# Patient Record
Sex: Male | Born: 1990 | Race: Black or African American | Hispanic: No | Marital: Single | Smoking: Current every day smoker
Health system: Southern US, Community
[De-identification: ages and names within clinical notes are randomized; demographics above are authoritative.]

## PROBLEM LIST (undated history)

## (undated) ENCOUNTER — Emergency Department (HOSPITAL_COMMUNITY): Admission: EM | Payer: Self-pay | Source: Home / Self Care

## (undated) DIAGNOSIS — Z789 Other specified health status: Secondary | ICD-10-CM

---
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
## 2010-05-26 LAB — URINE MICROSCOPIC-ADD ON

## 2010-05-26 LAB — URINALYSIS, ROUTINE W REFLEX MICROSCOPIC
Glucose, UA: NEGATIVE mg/dL
Specific Gravity, Urine: 1.014 (ref 1.005–1.030)
pH: 7 (ref 5.0–8.0)

## 2010-05-26 LAB — URINE CULTURE

## 2010-07-25 ENCOUNTER — Emergency Department (HOSPITAL_COMMUNITY): Payer: Medicaid Other

## 2010-07-25 ENCOUNTER — Emergency Department (HOSPITAL_COMMUNITY)
Admission: EM | Admit: 2010-07-25 | Discharge: 2010-07-25 | Disposition: A | Discharge status: Home / Self Care | Payer: Medicaid Other | Attending: Emergency Medicine | Admitting: Emergency Medicine

## 2010-07-25 DIAGNOSIS — M79609 Pain in unspecified limb: Secondary | ICD-10-CM | POA: Insufficient documentation

## 2010-07-25 DIAGNOSIS — W268XXA Contact with other sharp object(s), not elsewhere classified, initial encounter: Secondary | ICD-10-CM | POA: Insufficient documentation

## 2010-07-25 DIAGNOSIS — S61409A Unspecified open wound of unspecified hand, initial encounter: Secondary | ICD-10-CM | POA: Insufficient documentation

## 2012-01-20 ENCOUNTER — Encounter (HOSPITAL_COMMUNITY): Payer: Self-pay | Admitting: Nurse Practitioner

## 2012-01-20 ENCOUNTER — Emergency Department (HOSPITAL_COMMUNITY)
Admission: EM | Admit: 2012-01-20 | Discharge: 2012-01-20 | Disposition: A | Discharge status: Home / Self Care | Payer: Self-pay | Attending: Emergency Medicine | Admitting: Emergency Medicine

## 2012-01-20 DIAGNOSIS — N342 Other urethritis: Secondary | ICD-10-CM | POA: Insufficient documentation

## 2012-01-20 DIAGNOSIS — F17200 Nicotine dependence, unspecified, uncomplicated: Secondary | ICD-10-CM | POA: Insufficient documentation

## 2012-01-20 LAB — URINALYSIS, ROUTINE W REFLEX MICROSCOPIC
Hgb urine dipstick: NEGATIVE
Ketones, ur: NEGATIVE mg/dL
Nitrite: NEGATIVE
Urobilinogen, UA: 1 mg/dL (ref 0.0–1.0)

## 2012-01-20 MED ORDER — AZITHROMYCIN 250 MG PO TABS
1000.0000 mg | ORAL_TABLET | Freq: Once | ORAL | Status: AC
  Administered 2012-01-20: 1000 mg via ORAL
  Filled 2012-01-20: qty 4

## 2012-01-20 MED ORDER — CEFTRIAXONE SODIUM 250 MG IJ SOLR
250.0000 mg | Freq: Once | INTRAMUSCULAR | Status: AC
  Administered 2012-01-20: 250 mg via INTRAMUSCULAR
  Filled 2012-01-20: qty 250

## 2012-01-20 NOTE — ED Notes (Signed)
Pt c/o tenderness upon palpation right flank; pt mentating appropriately.

## 2012-01-20 NOTE — ED Provider Notes (Signed)
History    This chart was scribed for Chad Quarry, MD, MD by Smitty Pluck, ED Scribe. The patient was seen in room TR09C/ and the patient's care was started at 12:37PM.   CSN: [id number]  Arrival date & time 01/20/12  1149       Chief Complaint  Patient presents with  . SEXUALLY TRANSMITTED DISEASE    (Consider location/radiation/quality/duration/timing/severity/associated sxs/prior treatment) Patient is a 21 y.o. male presenting with dysuria. The history is provided by the patient. No language interpreter was used.  Dysuria  This is a new problem. The current episode started yesterday. The problem occurs every urination. The problem has not changed since onset.The quality of the pain is described as burning. The pain is moderate. There has been no fever. He is sexually active. There is no history of pyelonephritis. Pertinent negatives include no chills, no nausea and no vomiting.   [name] is a 21 y.o. male who presents to the Emergency Department complaining of constant, moderate dysuria onset 1 day ago. Pt reports having intercourse with girl  4 days agoand the condom broke. He reports that he has had sex 4x within last 6 months. He denies hx of STD. He denies pain in testicles, swelling in testicles, penile discharge and any other pain. He reports that he smokes cigarettes and drinks alcohol.  History reviewed. No pertinent past medical history.  History reviewed. No pertinent past surgical history.  History reviewed. No pertinent family history.  History  Substance Use Topics  . Smoking status: Current Every Day Smoker  . Smokeless tobacco: Not on file  . Alcohol Use: Yes     Comment: sometimes       Review of Systems  Constitutional: Negative for fever and chills.  Respiratory: Negative for shortness of breath.   Gastrointestinal: Negative for nausea and vomiting.  Genitourinary: Positive for dysuria. Negative for discharge, penile swelling, scrotal  swelling, penile pain and testicular pain.  Neurological: Negative for weakness.  All other systems reviewed and are negative.    Allergies  Review of patient's allergies indicates no known allergies.  Home Medications  No current outpatient prescriptions on file.  BP 134/82  Pulse 97  Temp 98 F (36.7 C) (Oral)  Resp 15  SpO2 99%  Physical Exam  Nursing note and vitals reviewed. Constitutional: He is oriented to person, place, and time. He appears well-developed and well-nourished. No distress.  HENT:  Head: Normocephalic and atraumatic.  Eyes: EOM are normal.  Neck: Neck supple. No tracheal deviation present.  Cardiovascular: Normal rate.   Pulmonary/Chest: Effort normal. No respiratory distress.  Genitourinary: Testes normal and penis normal. No penile tenderness.  Musculoskeletal: Normal range of motion.  Neurological: He is alert and oriented to person, place, and time.  Skin: Skin is warm and dry.  Psychiatric: He has a normal mood and affect. His behavior is normal.    ED Course  Procedures (including critical care time) DIAGNOSTIC STUDIES: Oxygen Saturation is 99% on room air, normal by my interpretation.    COORDINATION OF CARE: 12:40 PM Discussed ED treatment with pt      Labs Reviewed  URINALYSIS, ROUTINE W REFLEX MICROSCOPIC   No results found.   No diagnosis found.    MDM  I personally performed the services described in this documentation, which was scribed in my presence. The recorded information has been reviewed and considered.   Chad Quarry, MD 01/25/12 [phone]

## 2012-01-20 NOTE — ED Notes (Signed)
Pt given d/c teaching. Pt has no further questions upon d/c.

## 2012-01-20 NOTE — ED Notes (Signed)
Pt reports condom broke during sex last week adn he is having penile burning since yesterday

## 2012-05-15 ENCOUNTER — Emergency Department (HOSPITAL_COMMUNITY)
Admission: EM | Admit: 2012-05-15 | Discharge: 2012-05-15 | Disposition: A | Discharge status: Home / Self Care | Payer: Self-pay | Attending: Emergency Medicine | Admitting: Emergency Medicine

## 2012-05-15 ENCOUNTER — Encounter (HOSPITAL_COMMUNITY): Payer: Self-pay

## 2012-05-15 DIAGNOSIS — R300 Dysuria: Secondary | ICD-10-CM | POA: Insufficient documentation

## 2012-05-15 DIAGNOSIS — F17200 Nicotine dependence, unspecified, uncomplicated: Secondary | ICD-10-CM | POA: Insufficient documentation

## 2012-05-15 DIAGNOSIS — Z7251 High risk heterosexual behavior: Secondary | ICD-10-CM | POA: Insufficient documentation

## 2012-05-15 LAB — URINALYSIS, ROUTINE W REFLEX MICROSCOPIC
Bilirubin Urine: NEGATIVE
Glucose, UA: NEGATIVE mg/dL
Ketones, ur: NEGATIVE mg/dL
Leukocytes, UA: NEGATIVE
Specific Gravity, Urine: 1.018 (ref 1.005–1.030)
pH: 7.5 (ref 5.0–8.0)

## 2012-05-15 MED ORDER — LIDOCAINE HCL (PF) 1 % IJ SOLN
INTRAMUSCULAR | Status: AC
  Administered 2012-05-15: 5 mL
  Filled 2012-05-15: qty 5

## 2012-05-15 MED ORDER — AZITHROMYCIN 250 MG PO TABS
1000.0000 mg | ORAL_TABLET | Freq: Once | ORAL | Status: AC
  Administered 2012-05-15: 1000 mg via ORAL
  Filled 2012-05-15: qty 4

## 2012-05-15 MED ORDER — CEFTRIAXONE SODIUM 250 MG IJ SOLR
250.0000 mg | Freq: Once | INTRAMUSCULAR | Status: AC
  Administered 2012-05-15: 250 mg via INTRAMUSCULAR
  Filled 2012-05-15: qty 250

## 2012-05-15 NOTE — ED Provider Notes (Signed)
Medical screening examination/treatment/procedure(s) were performed by non-physician practitioner and as supervising physician I was immediately available for consultation/collaboration.   Charles B. Sheldon, MD 05/15/12 1520 

## 2012-05-15 NOTE — ED Provider Notes (Signed)
History     CSN: [id number]  Arrival date & time 05/15/12  1322   First MD Initiated Contact with Patient 05/15/12 1333      No chief complaint on file.   (Consider location/radiation/quality/duration/timing/severity/associated sxs/prior treatment) HPI  22 year old male with prior history of urethritis presents complaining of dysuria. Patient states he has sexual intercourse with his partner 4 days ago and his condom broke. Subsequently she developed burning urination which has been ongoing for the past 2 days.  Reports mild burning at tip of penis when peed.  Denies fever, chills, rash, testicular swelling or pain.  Denies abdomen or back pain.  No specific treatment tried, nothing makes it better or worse.   History reviewed. No pertinent past medical history.  History reviewed. No pertinent past surgical history.  History reviewed. No pertinent family history.  History  Substance Use Topics  . Smoking status: Current Every Day Smoker  . Smokeless tobacco: Not on file  . Alcohol Use: Yes     Comment: sometimes       Review of Systems  Constitutional: Negative for fever.  Genitourinary: Positive for dysuria. Negative for frequency, hematuria, flank pain, discharge, penile swelling, scrotal swelling, genital sores, penile pain and testicular pain.  Skin: Negative for rash and wound.  Neurological: Negative for numbness.    Allergies  Review of patient's allergies indicates no known allergies.  Home Medications  No current outpatient prescriptions on file.  BP 134/85  Pulse 80  Temp(Src) 96.8 F (36 C) (Oral)  Resp 16  SpO2 100%  Physical Exam  Nursing note and vitals reviewed. Constitutional: He appears well-developed and well-nourished.  HENT:  Head: Atraumatic.  Eyes: Conjunctivae are normal.  Neck: Neck supple.  Abdominal: Soft. There is no tenderness.  Genitourinary: Penis normal. No penile tenderness.  No cva tenderness  Neurological: He is alert.   Skin: Skin is warm. No rash noted.  Psychiatric: He has a normal mood and affect.    ED Course  Procedures (including critical care time)  2:45 PM Pt reports of dysuria and urinary discomfort after his condom broke.  Examination of genitalia is unremarkable.  ua shows no evidence of UTI.  Will preemptively treat for possible std with rocephin/zithromax which pt agrees.    Labs Reviewed  GC/CHLAMYDIA PROBE AMP  URINALYSIS, ROUTINE W REFLEX MICROSCOPIC   No results found. Results for orders placed during the hospital encounter of 05/15/12  URINALYSIS, ROUTINE W REFLEX MICROSCOPIC      Result Value Range   Color, Urine YELLOW  YELLOW   APPearance CLEAR  CLEAR   Specific Gravity, Urine 1.018  1.005 - 1.030   pH 7.5  5.0 - 8.0   Glucose, UA NEGATIVE  NEGATIVE mg/dL   Hgb urine dipstick NEGATIVE  NEGATIVE   Bilirubin Urine NEGATIVE  NEGATIVE   Ketones, ur NEGATIVE  NEGATIVE mg/dL   Protein, ur NEGATIVE  NEGATIVE mg/dL   Urobilinogen, UA 1.0  0.0 - 1.0 mg/dL   Nitrite NEGATIVE  NEGATIVE   Leukocytes, UA NEGATIVE  NEGATIVE   No results found.    1. Dysuria       MDM  BP 134/85  Pulse 80  Temp(Src) 96.8 F (36 C) (Oral)  Resp 16  SpO2 100%         Fayrene Helper, PA-C 05/15/12 1517

## 2012-05-15 NOTE — ED Notes (Signed)
Pt presents with dysuria x 4 days.  Pt reports intercourse, with broken condom.  Unknown if partner is symptomatic.

## 2012-08-23 ENCOUNTER — Encounter (HOSPITAL_COMMUNITY): Payer: Self-pay | Admitting: *Deleted

## 2012-08-23 ENCOUNTER — Emergency Department (HOSPITAL_COMMUNITY)
Admission: EM | Admit: 2012-08-23 | Discharge: 2012-08-23 | Disposition: A | Discharge status: Home / Self Care | Payer: Self-pay | Attending: Emergency Medicine | Admitting: Emergency Medicine

## 2012-08-23 DIAGNOSIS — F17200 Nicotine dependence, unspecified, uncomplicated: Secondary | ICD-10-CM | POA: Insufficient documentation

## 2012-08-23 DIAGNOSIS — Z113 Encounter for screening for infections with a predominantly sexual mode of transmission: Secondary | ICD-10-CM | POA: Insufficient documentation

## 2012-08-23 NOTE — ED Provider Notes (Signed)
   History    CSN: [id number] Arrival date & time 08/23/12  2000  First MD Initiated Contact with Patient 08/23/12 2019     Chief Complaint  Patient presents with  . Exposure to STD   HPI  History provided by the patient. Patient is a 22 year old male with no sniffed in PMH who presents with request for general STD screening. Patient is currently sexually active and reports last intercourse 3 weeks ago. He does report using condom, barrier protection. He has no reason to believe his partner had any STDs and he currently denies any symptoms. Denies any rash or irritation to the skin. No penile discharge. No dysuria, urinary frequency abdominal pain. No fevers, chills or night sweats. No weight changes. Patient states that he occasionally presents to the emergency room for general STD screening and has done so in the past due to sexual activity. Denies any other risk factors for STDs. No IV drug use.     History reviewed. No pertinent past medical history. History reviewed. No pertinent past surgical history. No family history on file. History  Substance Use Topics  . Smoking status: Current Every Day Smoker  . Smokeless tobacco: Not on file  . Alcohol Use: Yes     Comment: sometimes     Review of Systems  Constitutional: Negative for fever, chills, diaphoresis and unexpected weight change.  Gastrointestinal: Negative for abdominal pain.  Genitourinary: Negative for dysuria, frequency, discharge and penile swelling.  All other systems reviewed and are negative.    Allergies  Review of patient's allergies indicates no known allergies.  Home Medications  No current outpatient prescriptions on file. BP 127/96  Pulse 92  Temp(Src) 98.5 F (36.9 C) (Oral)  Resp 12  SpO2 100% Physical Exam  Nursing note and vitals reviewed. Constitutional: He is oriented to person, place, and time. He appears well-developed and well-nourished. No distress.  HENT:  Head: Normocephalic.   Cardiovascular: Normal rate and regular rhythm.   Pulmonary/Chest: Effort normal and breath sounds normal. No respiratory distress.  Abdominal: Soft. There is no tenderness. There is no rebound and no guarding.  Genitourinary: Testes normal and penis normal.  2 small soft nodules to the right side of the glans. No overlying skin changes. No vesicles. Per patient these are chronic since young childhood.no penile discharge. No other rashes or lesions. Testicles nontender normal without asymmetry. No lymphadenopathy in the groin.  Musculoskeletal: Normal range of motion.  Lymphadenopathy:       Right: No inguinal adenopathy present.       Left: No inguinal adenopathy present.  Neurological: He is alert and oriented to person, place, and time.  Skin: Skin is warm. No rash noted. No erythema.  Psychiatric: He has a normal mood and affect. His behavior is normal.    ED Course  Procedures    Labs Reviewed  GC/CHLAMYDIA PROBE AMP  RPR  HIV ANTIBODY (ROUTINE TESTING)      1. Screen for STD (sexually transmitted disease)       MDM  8:20PMpatient seen and evaluated. Patient well appearing. He has no complaints or symptoms. He is simply concerned and requesting general screening.  Angus Seller, PA-C 08/23/12 2041

## 2012-08-23 NOTE — ED Notes (Signed)
He just wants a std check

## 2012-08-24 LAB — GC/CHLAMYDIA PROBE AMP
CT Probe RNA: NEGATIVE
GC Probe RNA: NEGATIVE

## 2012-08-24 LAB — HIV ANTIBODY (ROUTINE TESTING W REFLEX): HIV: NONREACTIVE

## 2012-08-24 NOTE — ED Provider Notes (Signed)
  Medical screening examination/treatment/procedure(s) were performed by non-physician practitioner and as supervising physician I was immediately available for consultation/collaboration.    Gerhard Munch, MD 08/24/12 [phone]

## 2013-05-01 ENCOUNTER — Encounter (HOSPITAL_COMMUNITY): Payer: Self-pay | Admitting: Emergency Medicine

## 2013-05-01 ENCOUNTER — Emergency Department (HOSPITAL_COMMUNITY)
Admission: EM | Admit: 2013-05-01 | Discharge: 2013-05-01 | Disposition: A | Discharge status: Rehabilitation Facility | Payer: Self-pay | Attending: Emergency Medicine | Admitting: Emergency Medicine

## 2013-05-01 ENCOUNTER — Inpatient Hospital Stay (HOSPITAL_COMMUNITY): Admission: AD | Admit: 2013-05-01 | Payer: Self-pay | Source: Intra-hospital | Admitting: Psychiatry

## 2013-05-01 DIAGNOSIS — F329 Major depressive disorder, single episode, unspecified: Secondary | ICD-10-CM

## 2013-05-01 DIAGNOSIS — F17200 Nicotine dependence, unspecified, uncomplicated: Secondary | ICD-10-CM | POA: Insufficient documentation

## 2013-05-01 DIAGNOSIS — F1020 Alcohol dependence, uncomplicated: Secondary | ICD-10-CM | POA: Diagnosis present

## 2013-05-01 DIAGNOSIS — F32A Depression, unspecified: Secondary | ICD-10-CM | POA: Diagnosis present

## 2013-05-01 DIAGNOSIS — F1010 Alcohol abuse, uncomplicated: Secondary | ICD-10-CM | POA: Insufficient documentation

## 2013-05-01 DIAGNOSIS — Z0289 Encounter for other administrative examinations: Secondary | ICD-10-CM | POA: Insufficient documentation

## 2013-05-01 DIAGNOSIS — F3289 Other specified depressive episodes: Secondary | ICD-10-CM

## 2013-05-01 LAB — COMPREHENSIVE METABOLIC PANEL
ALT: 17 U/L (ref 0–53)
AST: 45 U/L — ABNORMAL HIGH (ref 0–37)
Albumin: 4.4 g/dL (ref 3.5–5.2)
Alkaline Phosphatase: 63 U/L (ref 39–117)
BILIRUBIN TOTAL: 0.3 mg/dL (ref 0.3–1.2)
BUN: 8 mg/dL (ref 6–23)
CHLORIDE: 104 meq/L (ref 96–112)
CO2: 24 meq/L (ref 19–32)
CREATININE: 0.89 mg/dL (ref 0.50–1.35)
Calcium: 9 mg/dL (ref 8.4–10.5)
GFR calc Af Amer: >90 mL/min (ref >90)
Glucose, Bld: 87 mg/dL (ref 70–99)
Potassium: 4 mEq/L (ref 3.7–5.3)
Sodium: 146 mEq/L (ref 137–147)
Total Protein: 7.6 g/dL (ref 6.0–8.3)

## 2013-05-01 LAB — CBC
HEMATOCRIT: 43.6 % (ref 39.0–52.0)
Hemoglobin: 15.7 g/dL (ref 13.0–17.0)
MCH: 31.7 pg (ref 26.0–34.0)
MCHC: 36 g/dL (ref 30.0–36.0)
MCV: 87.9 fL (ref 78.0–100.0)
Platelets: 240 10*3/uL (ref 150–400)
RBC: 4.96 MIL/uL (ref 4.22–5.81)
RDW: 13.8 % (ref 11.5–15.5)
WBC: 5.8 10*3/uL (ref 4.0–10.5)

## 2013-05-01 LAB — RAPID URINE DRUG SCREEN, HOSP PERFORMED
Amphetamines: NOT DETECTED
Barbiturates: NOT DETECTED
Benzodiazepines: NOT DETECTED
Cocaine: NOT DETECTED
OPIATES: NOT DETECTED
Tetrahydrocannabinol: NOT DETECTED

## 2013-05-01 LAB — ETHANOL: ALCOHOL ETHYL (B): 345 mg/dL — AB (ref 0–11)

## 2013-05-01 MED ORDER — CHLORDIAZEPOXIDE HCL 25 MG PO CAPS: 25.0000 mg | ORAL_CAPSULE | Freq: Four times a day (QID) | ORAL | Status: DC | PRN

## 2013-05-01 MED ORDER — LORAZEPAM 1 MG PO TABS: 1.0000 mg | ORAL_TABLET | Freq: Three times a day (TID) | ORAL | Status: DC | PRN

## 2013-05-01 MED ORDER — NICOTINE 21 MG/24HR TD PT24: 21.0000 mg | MEDICATED_PATCH | Freq: Every day | TRANSDERMAL | Status: DC

## 2013-05-01 MED ORDER — ALUM & MAG HYDROXIDE-SIMETH 200-200-20 MG/5ML PO SUSP: 30.0000 mL | ORAL | Status: DC | PRN

## 2013-05-01 MED ORDER — VITAMIN B-1 100 MG PO TABS
100.0000 mg | ORAL_TABLET | Freq: Once | ORAL | Status: AC
  Administered 2013-05-01: 100 mg via ORAL
  Filled 2013-05-01: qty 1

## 2013-05-01 MED ORDER — THIAMINE HCL 100 MG/ML IJ SOLN
100.0000 mg | Freq: Once | INTRAMUSCULAR | Status: DC
Start: 2013-05-01 — End: 2013-05-01

## 2013-05-01 MED ORDER — ONDANSETRON HCL 4 MG PO TABS: 4.0000 mg | ORAL_TABLET | Freq: Three times a day (TID) | ORAL | Status: DC | PRN

## 2013-05-01 MED ORDER — IBUPROFEN 200 MG PO TABS: 600.0000 mg | ORAL_TABLET | Freq: Three times a day (TID) | ORAL | Status: DC | PRN

## 2013-05-01 MED ORDER — LOPERAMIDE HCL 2 MG PO CAPS: 2.0000 mg | ORAL_CAPSULE | ORAL | Status: DC | PRN

## 2013-05-01 MED ORDER — TRAZODONE HCL 50 MG PO TABS: 50.0000 mg | ORAL_TABLET | Freq: Every day | ORAL | Status: DC

## 2013-05-01 MED ORDER — ADULT MULTIVITAMIN W/MINERALS CH
1.0000 | ORAL_TABLET | Freq: Every day | ORAL | Status: DC
  Administered 2013-05-01: 1 via ORAL
  Filled 2013-05-01: qty 1

## 2013-05-01 MED ORDER — ZOLPIDEM TARTRATE 5 MG PO TABS: 5.0000 mg | ORAL_TABLET | Freq: Every evening | ORAL | Status: DC | PRN

## 2013-05-01 MED ORDER — HYDROXYZINE HCL 25 MG PO TABS: 25.0000 mg | ORAL_TABLET | Freq: Four times a day (QID) | ORAL | Status: DC | PRN

## 2013-05-01 MED ORDER — ACETAMINOPHEN 325 MG PO TABS: 650.0000 mg | ORAL_TABLET | ORAL | Status: DC | PRN

## 2013-05-01 MED ORDER — CHLORDIAZEPOXIDE HCL 25 MG PO CAPS
25.0000 mg | ORAL_CAPSULE | Freq: Once | ORAL | Status: AC
  Administered 2013-05-01: 25 mg via ORAL
  Filled 2013-05-01: qty 1

## 2013-05-01 MED ORDER — VITAMIN B-1 100 MG PO TABS: 100.0000 mg | ORAL_TABLET | Freq: Every day | ORAL | Status: DC

## 2013-05-01 MED ORDER — ONDANSETRON 4 MG PO TBDP: 4.0000 mg | ORAL_TABLET | Freq: Four times a day (QID) | ORAL | Status: DC | PRN

## 2013-05-01 MED ORDER — CHLORDIAZEPOXIDE HCL 25 MG PO CAPS: 25.0000 mg | ORAL_CAPSULE | Freq: Three times a day (TID) | ORAL | Status: DC

## 2013-05-01 MED ORDER — CHLORDIAZEPOXIDE HCL 25 MG PO CAPS: 25.0000 mg | ORAL_CAPSULE | ORAL | Status: DC

## 2013-05-01 MED ORDER — CHLORDIAZEPOXIDE HCL 25 MG PO CAPS
25.0000 mg | ORAL_CAPSULE | Freq: Four times a day (QID) | ORAL | Status: DC
  Administered 2013-05-01 (×2): 25 mg via ORAL
  Filled 2013-05-01 (×2): qty 1

## 2013-05-01 MED ORDER — CHLORDIAZEPOXIDE HCL 25 MG PO CAPS: 25.0000 mg | ORAL_CAPSULE | Freq: Every day | ORAL | Status: DC

## 2013-05-01 NOTE — BH Assessment (Signed)
Assessment Note  Chad Tanner is an 23 y.o. male.   Pt consumes alcohol 3x per week.  Pt consumes approimately 100 to 200 oz at a time.  Pt has been using for months.  Pt denies consuming daily.  Pt denies SI, HI and psychosis.  Pt denies military service and no past mental health issues.  Pt denies depression.  Pt BAL was 345 last night.  Pt reports "I drank more than usual last night."    Pt appears to need optx referrals and pt is in agreement with this plan.  Pt able to contract for safety and psychiatry is going to round to determine dispo with pt.  Axis I: Alcohol Abuse Axis II: Deferred Axis III: No past medical history on file. Axis IV: other psychosocial or environmental problems and problems related to social environment Axis V: 51-60 moderate symptoms  Past Medical History: No past medical history on file.  No past surgical history on file.  Family History: History reviewed. No pertinent family history.  Social History:  reports that he has been smoking.  He does not have any smokeless tobacco history on file. He reports that he drinks alcohol. He reports that he does not use illicit drugs.  Additional Social History:  Alcohol / Drug Use Pain Medications: na Prescriptions: na Over the Counter: na History of alcohol / drug use?: Yes Longest period of sobriety (when/how long): na Negative Consequences of Use: Personal relationships Substance #1 Name of Substance 1: alcohol 1 - Age of First Use: teen 1 - Amount (size/oz): 100 oz 1 - Frequency: 3 to 4x per week 1 - Duration: months 1 - Last Use / Amount: 05-01-13  CIWA: CIWA-Ar BP: 103/62 mmHg Pulse Rate: 84 Nausea and Vomiting: no nausea and no vomiting Tactile Disturbances: none Tremor: no tremor Auditory Disturbances: not present Paroxysmal Sweats: no sweat visible Visual Disturbances: not present Anxiety: no anxiety, at ease Headache, Fullness in Head: none present Agitation: normal activity Orientation  and Clouding of Sensorium: oriented and can do serial additions CIWA-Ar Total: 0 COWS:    Allergies: No Known Allergies  Home Medications:  (Not in a hospital admission)  OB/GYN Status:  No LMP for male patient.  General Assessment Data Location of Assessment: [facility] ED Is this a Tele or Face-to-Face Assessment?: Face-to-Face Is this an Initial Assessment or a Re-assessment for this encounter?: Initial Assessment Living Arrangements: Parent Can pt return to current living arrangement?: Yes Admission Status: Voluntary Is patient capable of signing voluntary admission?: Yes Transfer from: Acute Hospital Referral Source: MD  Medical Screening Exam [facility] Walk-[facility] ONLY) Medical Exam completed: Yes  [facility] Crisis Care Plan Living Arrangements: Parent Name of Psychiatrist: na Name of Therapist: na  Education Status Is patient currently in school?: No Current Grade: na Highest grade of school patient has completed: na Name of school: na Contact person: nna  Risk to self Suicidal Ideation: No Suicidal Intent: No Is patient at risk for suicide?: No Suicidal Plan?: No Access to Means: No What has been your use of drugs/alcohol within the last 12 months?: alcohol Previous Attempts/Gestures: No How many times?: 0 Other Self Harm Risks: na Triggers for Past Attempts: None known Intentional Self Injurious Behavior: None Family Suicide History: No Recent stressful life event(s): Other (Comment) (life) Persecutory voices/beliefs?: No Depression: No Substance abuse history and/or treatment for substance abuse?: No Suicide prevention information given to non-admitted patients: Not applicable  Risk to Others Homicidal Ideation: No Thoughts of Harm to Others: No  Current Homicidal Intent: No Current Homicidal Plan: No Access to Homicidal Means: No Identified Victim: na History of harm to others?: No Assessment of Violence: None Noted Violent Behavior Description: cooperative Does  patient have access to weapons?: No Criminal Charges Pending?: No Does patient have a court date: No  Psychosis Hallucinations: None noted Delusions: None noted  Mental Status Report Appear/Hygiene: Disheveled Eye Contact: Fair Motor Activity: Unremarkable Speech: Soft Level of Consciousness: Alert Mood: Other (Comment) (appropriate) Affect: Appropriate to circumstance Anxiety Level: None Thought Processes: Coherent Judgement: Unimpaired Orientation: Person;Place;Situation Obsessive Compulsive Thoughts/Behaviors: None  Cognitive Functioning Concentration: Decreased Memory: Recent Intact;Remote Intact IQ: Average Insight: Fair Impulse Control: Fair Appetite: Fair Weight Loss: 0 Weight Gain: 0 Sleep: Decreased Total Hours of Sleep: 3 Vegetative Symptoms: None  ADLScreening [facility] Patient's cognitive ability adequate to safely complete daily activities?: Yes Patient able to express need for assistance with ADLs?: Yes Independently performs ADLs?: No  Prior Inpatient Therapy Prior Inpatient Therapy: No Prior Therapy Dates: na Prior Therapy Facilty/Provider(s): na Reason for Treatment: na  Prior Outpatient Therapy Prior Outpatient Therapy: No Prior Therapy Dates: na Prior Therapy Facilty/Provider(s): na Reason for Treatment: na  ADL Screening (condition at time of admission) Patient's cognitive ability adequate to safely complete daily activities?: Yes Patient able to express need for assistance with ADLs?: Yes Independently performs ADLs?: No         Values / Beliefs Cultural Requests During Hospitalization: None Spiritual Requests During Hospitalization: None        Additional Information 1:1 In Past 12 Months?: No CIRT Risk: No Elopement Risk: No Does patient have medical clearance?: Yes     Disposition:  Disposition Initial Assessment Completed for this Encounter: Yes Disposition of Patient: Outpatient treatment Type of  outpatient treatment: [facility] ([facility])  On Site Evaluation by:   Reviewed with Physician:    Titus MouldLindsay Jr, Eppie Gibsonobert Andrew 05/01/2013 9:11 AM

## 2013-05-01 NOTE — Consult Note (Signed)
Patient requests alcohol and drug detox and assistance with depression.  [facility]  Herminio HeadsJamison Y. Lord, PMH-NP 05/01/2013  Reviewed the information documented and agree with the treatment plan.  Karilynn Carranza,JANARDHAHA R. 05/01/2013 7:20 PM

## 2013-05-01 NOTE — Progress Notes (Signed)
[facility]  [facility]   Tomi BambergerMariya Mellony Danziger Disposition MHT

## 2013-05-01 NOTE — Progress Notes (Addendum)
[facility]  [name] from [facility] requesting additional information.  Additional information faxed.   Chad BambergerMariya Herny Tanner Disposition MHT

## 2013-05-01 NOTE — ED Provider Notes (Signed)
Medical screening examination/treatment/procedure(s) were performed by non-physician practitioner and as supervising physician I was immediately available for consultation/collaboration.   EKG Interpretation None       Relena Ivancic M Toyoko Silos, MD 05/01/13 0725 

## 2013-05-01 NOTE — Progress Notes (Signed)
Pt has been accepted by Dr. Elsie SaasJonnalagadda to 300 hall to room 306-2 to Dr. Dub MikesLugo.   Tomi BambergerMariya Yomira Flitton Disposition MHT

## 2013-05-01 NOTE — ED Notes (Signed)
Pt belongings:   1 pair of [place]jordan flip flops 1 black jacket 1 pair of black sweat pants 1 i-phone with white case 1 white t-shirt

## 2013-05-01 NOTE — ED Notes (Signed)
Patient resting in bed with eyes closed. No s/s of distress noted at this time. Respirations regular and unlabored.  

## 2013-05-01 NOTE — Discharge Instructions (Addendum)
Go to [facility]  Follow up with your doctor.   Return to ER if you have thoughts of harming yourself or others, withdrawal.    Alcohol Problems Most adults who drink alcohol drink in moderation (not a lot) are at low risk for developing problems related to their drinking. However, all drinkers, including low-risk drinkers, should know about the health risks connected with drinking alcohol. RECOMMENDATIONS FOR LOW-RISK DRINKING  Drink in moderation. Moderate drinking is defined as follows:   Men - no more than 2 drinks per day.  Nonpregnant women - no more than 1 drink per day.  Over age 82 - no more than 1 drink per day. A standard drink is 12 grams of pure alcohol, which is equal to a 12 ounce bottle of beer or wine cooler, a 5 ounce glass of wine, or 1.5 ounces of distilled spirits (such as whiskey, brandy, vodka, or rum).  ABSTAIN FROM (DO NOT DRINK) ALCOHOL:  When pregnant or considering pregnancy.  When taking a medication that interacts with alcohol.  If you are alcohol dependent.  A medical condition that prohibits drinking alcohol (such as ulcer, liver disease, or heart disease). DISCUSS WITH YOUR CAREGIVER:  If you are at risk for coronary heart disease, discuss the potential benefits and risks of alcohol use: Light to moderate drinking is associated with lower rates of coronary heart disease in certain populations (for example, men over age 90 and postmenopausal women). Infrequent or nondrinkers are advised not to begin light to moderate drinking to reduce the risk of coronary heart disease so as to avoid creating an alcohol-related problem. Similar protective effects can likely be gained through proper diet and exercise.  Women and the elderly have smaller amounts of body water than men. As a result women and the elderly achieve a higher blood alcohol concentration after drinking the same amount of alcohol.  Exposing a fetus to alcohol can cause a broad range of birth defects  referred to as Fetal Alcohol Syndrome (FAS) or Alcohol-Related Birth Defects (ARBD). Although FAS/ARBD is connected with excessive alcohol consumption during pregnancy, studies also have reported neurobehavioral problems in infants born to mothers reporting drinking an average of 1 drink per day during pregnancy.  Heavier drinking (the consumption of more than 4 drinks per occasion by men and more than 3 drinks per occasion by women) impairs learning (cognitive) and psychomotor functions and increases the risk of alcohol-related problems, including accidents and injuries. CAGE QUESTIONS:   Have you ever felt that you should Cut down on your drinking?  Have people Annoyed you by criticizing your drinking?  Have you ever felt bad or Guilty about your drinking?  Have you ever had a drink first thing in the morning to steady your nerves or get rid of a hangover (Eye opener)? If you answered positively to any of these questions: You may be at risk for alcohol-related problems if alcohol consumption is:   Men: Greater than 14 drinks per week or more than 4 drinks per occasion.  Women: Greater than 7 drinks per week or more than 3 drinks per occasion. Do you or your family have a medical history of alcohol-related problems, such as:  Blackouts.  Sexual dysfunction.  Depression.  Trauma.  Liver dysfunction.  Sleep disorders.  Hypertension.  Chronic abdominal pain.  Has your drinking ever caused you problems, such as problems with your family, problems with your work (or school) performance, or accidents/injuries?  Do you have a compulsion to drink or a  preoccupation with drinking?  Do you have poor control or are you unable to stop drinking once you have started?  Do you have to drink to avoid withdrawal symptoms?  Do you have problems with withdrawal such as tremors, nausea, sweats, or mood disturbances?  Does it take more alcohol than in the past to get you high?  Do you feel  a strong urge to drink?  Do you change your plans so that you can have a drink?  Do you ever drink in the morning to relieve the shakes or a hangover? If you have answered a number of the previous questions positively, it may be time for you to talk to your caregivers, family, and friends and see if they think you have a problem. Alcoholism is a chemical dependency that keeps getting worse and will eventually destroy your health and relationships. Many alcoholics end up dead, impoverished, or in prison. This is often the end result of all chemical dependency.  Do not be discouraged if you are not ready to take action immediately.  Decisions to change behavior often involve up and down desires to change and feeling like you cannot decide.  Try to think more seriously about your drinking behavior.  Think of the reasons to quit. WHERE TO GO FOR ADDITIONAL INFORMATION   The National Institute on Alcohol Abuse and Alcoholism (NIAAA) BasicStudents.dkwww.niaaa.nih.gov  [employer] on Alcoholism and Drug Dependence (NCADD) www.ncadd.org  American Society of Addiction Medicine (ASAM) RoyalDiary.glwww.asam.org  Document Released: 02/04/2005 Document Revised: 04/29/2011 Document Reviewed: 09/23/2007 [facility] Patient Information 2014 [employer] [place].

## 2013-05-01 NOTE — ED Notes (Signed)
Pt states he has been drinking since he was [age] years old, and he would like to have detox,  Denies SI and HI

## 2013-05-01 NOTE — ED Provider Notes (Signed)
CSN: [id number]     Arrival date & time 05/01/13  0043 History   First MD Initiated Contact with Patient 05/01/13 0144     Chief Complaint  Patient presents with  . Medical Clearance    (Consider location/radiation/quality/duration/timing/severity/associated sxs/prior Treatment) HPI Comments: Patient is a 23 year old male who presents to the emergency department for assistance with placement in a rehabilitation facility for alcohol abuse. Patient states he has been drinking heavily for over a year. Patient states he usually drinks beer or absolute vodka. Patient last drank a few ounces of absolute vodka prior to arrival. He states that some family stressors have been causing him to drink more heavily as of late. He denies illicit drug use, SI, and HI. Denies every being in a rehab facility for ETOH abuse. Denies a hx of seizures or tremors for ETOH withdrawal.  The history is provided by the patient. No language interpreter was used.    No past medical history on file. No past surgical history on file. History reviewed. No pertinent family history. History  Substance Use Topics  . Smoking status: Current Every Day Smoker  . Smokeless tobacco: Not on file  . Alcohol Use: Yes     Comment: daily    Review of Systems  Psychiatric/Behavioral:       +etoh abuse  All other systems reviewed and are negative.     Allergies  Review of patient's allergies indicates no known allergies.  Home Medications  No current outpatient prescriptions on file. BP 100/60  Pulse 77  Temp(Src) 97.8 F (36.6 C) (Oral)  Resp 16  SpO2 98%  Physical Exam  Nursing note and vitals reviewed. Constitutional: He is oriented to person, place, and time. He appears well-developed and well-nourished. No distress.  HENT:  Head: Normocephalic and atraumatic.  Mouth/Throat: Oropharynx is clear and moist. No oropharyngeal exudate.  Eyes: Conjunctivae and EOM are normal. Pupils are equal, round, and reactive  to light. No scleral icterus.  Neck: Normal range of motion.  Cardiovascular: Normal rate, regular rhythm and normal heart sounds.   Pulmonary/Chest: Effort normal and breath sounds normal. No respiratory distress. He has no wheezes. He has no rales.  Abdominal: Soft. He exhibits no distension. There is no tenderness. There is no rebound and no guarding.  Musculoskeletal: Normal range of motion.  Neurological: He is alert and oriented to person, place, and time. GCS eye subscore is 4. GCS verbal subscore is 5. GCS motor subscore is 6.  GCS 15. Speech is goal oriented. Patient answers questions appropriately and follows simple commands.  Skin: Skin is warm and dry. No rash noted. He is not diaphoretic. No erythema. No pallor.  Psychiatric: He has a normal mood and affect. His speech is normal and behavior is normal. Cognition and memory are normal. He expresses no homicidal and no suicidal ideation. He expresses no suicidal plans and no homicidal plans.    ED Course  Procedures (including critical care time) Labs Review Labs Reviewed  COMPREHENSIVE METABOLIC PANEL - Abnormal; Notable for the following:    AST 45 (*)    All other components within normal limits  ETHANOL - Abnormal; Notable for the following:    Alcohol, Ethyl (B) 345 (*)    All other components within normal limits  CBC  URINE RAPID DRUG SCREEN (HOSP PERFORMED)   Imaging Review No results found.   EKG Interpretation None      MDM   Final diagnoses:  Alcohol abuse    23 year old  male presents for alcohol abuse. He is requesting detox and placement in a rehabilitation facility. No SI/HI. Patient denies illicit drug use. Physical exam unremarkable. Workup significant for ethanol level of 345. Patient medically cleared with TTS eval pending. Patient to be dispositioned by oncoming ED provider.    Antony Madura, PA-C 05/01/13 [phone]

## 2013-09-05 ENCOUNTER — Encounter (HOSPITAL_COMMUNITY): Payer: Self-pay | Admitting: Emergency Medicine

## 2013-09-05 ENCOUNTER — Emergency Department (HOSPITAL_COMMUNITY)
Admission: EM | Admit: 2013-09-05 | Discharge: 2013-09-05 | Disposition: A | Discharge status: Home / Self Care | Payer: Medicaid Other | Attending: Emergency Medicine | Admitting: Emergency Medicine

## 2013-09-05 DIAGNOSIS — L03116 Cellulitis of left lower limb: Secondary | ICD-10-CM

## 2013-09-05 DIAGNOSIS — F17200 Nicotine dependence, unspecified, uncomplicated: Secondary | ICD-10-CM | POA: Insufficient documentation

## 2013-09-05 DIAGNOSIS — S80869A Insect bite (nonvenomous), unspecified lower leg, initial encounter: Principal | ICD-10-CM

## 2013-09-05 DIAGNOSIS — L089 Local infection of the skin and subcutaneous tissue, unspecified: Secondary | ICD-10-CM | POA: Insufficient documentation

## 2013-09-05 DIAGNOSIS — W57XXXA Bitten or stung by nonvenomous insect and other nonvenomous arthropods, initial encounter: Principal | ICD-10-CM

## 2013-09-05 DIAGNOSIS — Y939 Activity, unspecified: Secondary | ICD-10-CM | POA: Insufficient documentation

## 2013-09-05 DIAGNOSIS — Y929 Unspecified place or not applicable: Secondary | ICD-10-CM | POA: Insufficient documentation

## 2013-09-05 MED ORDER — TRAMADOL HCL 50 MG PO TABS: 50.0000 mg | ORAL_TABLET | Freq: Four times a day (QID) | ORAL | Status: DC | PRN

## 2013-09-05 MED ORDER — CLINDAMYCIN HCL 300 MG PO CAPS
300.0000 mg | ORAL_CAPSULE | Freq: Once | ORAL | Status: AC
  Administered 2013-09-05: 300 mg via ORAL
  Filled 2013-09-05: qty 1

## 2013-09-05 MED ORDER — CLINDAMYCIN HCL 150 MG PO CAPS: 300.0000 mg | ORAL_CAPSULE | Freq: Three times a day (TID) | ORAL | Status: DC

## 2013-09-05 MED ORDER — TRAMADOL HCL 50 MG PO TABS
50.0000 mg | ORAL_TABLET | Freq: Once | ORAL | Status: AC
  Administered 2013-09-05: 50 mg via ORAL
  Filled 2013-09-05: qty 1

## 2013-09-05 NOTE — ED Provider Notes (Signed)
Medical screening examination/treatment/procedure(s) were performed by non-physician practitioner and as supervising physician I was immediately available for consultation/collaboration.   EKG Interpretation None        Roshard Rezabek S Shawnae Leiva, MD 09/05/13 1600 
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
## 2013-09-05 NOTE — Discharge Instructions (Signed)
Take Clindamycin as directed until gone. Take Tramadol as needed for pain. Refer to attached documents for more information. Return to the ED with worsening or concerning symptoms.

## 2014-04-19 ENCOUNTER — Emergency Department (HOSPITAL_COMMUNITY)
Admission: EM | Admit: 2014-04-19 | Discharge: 2014-04-20 | Disposition: A | Discharge status: Home / Self Care | Payer: Medicaid Other | Attending: Emergency Medicine | Admitting: Emergency Medicine

## 2014-04-19 ENCOUNTER — Encounter (HOSPITAL_COMMUNITY): Payer: Self-pay | Admitting: Emergency Medicine

## 2014-04-19 DIAGNOSIS — B86 Scabies: Secondary | ICD-10-CM | POA: Insufficient documentation

## 2014-04-19 DIAGNOSIS — Z720 Tobacco use: Secondary | ICD-10-CM | POA: Insufficient documentation

## 2014-04-19 DIAGNOSIS — Z792 Long term (current) use of antibiotics: Secondary | ICD-10-CM | POA: Insufficient documentation

## 2014-04-19 MED ORDER — HYDROXYZINE HCL 10 MG PO TABS: 10.0000 mg | ORAL_TABLET | Freq: Four times a day (QID) | ORAL | Status: DC | PRN

## 2014-04-19 MED ORDER — HYDROXYZINE HCL 25 MG PO TABS
25.0000 mg | ORAL_TABLET | Freq: Once | ORAL | Status: AC
Start: 2014-04-19 — End: 2014-04-19
  Administered 2014-04-19: 25 mg via ORAL
  Filled 2014-04-19: qty 1

## 2014-04-19 MED ORDER — PERMETHRIN 5 % EX CREA: TOPICAL_CREAM | CUTANEOUS | Status: DC

## 2014-04-19 NOTE — Discharge Instructions (Signed)
1. Medications: permetherin, atarax, usual home medications 2. Treatment: rest, drink plenty of fluids,  3. Follow Up: Please followup with your primary doctor in 7 days for discussion of your diagnoses and further evaluation after today's visit; if you do not have a primary care doctor use the resource guide provided to find one; Please return to the ER for signs of infection, fevers, worsening symptoms   Scabies Scabies are small bugs (mites) that burrow under the skin and cause red bumps and severe itching. These bugs can only be seen with a microscope. Scabies are highly contagious. They can spread easily from person to person by direct contact. They are also spread through sharing clothing or linens that have the scabies mites living in them. It is not unusual for an entire family to become infected through shared towels, clothing, or bedding.  HOME CARE INSTRUCTIONS   Your caregiver may prescribe a cream or lotion to kill the mites. If cream is prescribed, massage the cream into the entire body from the neck to the bottom of both feet. Also massage the cream into the scalp and face if your child is less than 44 year old. Avoid the eyes and mouth. Do not wash your hands after application.  Leave the cream on for 8 to 12 hours. Your child should bathe or shower after the 8 to 12 hour application period. Sometimes it is helpful to apply the cream to your child right before bedtime.  One treatment is usually effective and will eliminate approximately 95% of infestations. For severe cases, your caregiver may decide to repeat the treatment in 1 week. Everyone in your household should be treated with one application of the cream.  New rashes or burrows should not appear within 24 to 48 hours after successful treatment. However, the itching and rash may last for 2 to 4 weeks after successful treatment. Your caregiver may prescribe a medicine to help with the itching or to help the rash go away more  quickly.  Scabies can live on clothing or linens for up to 3 days. All of your child's recently used clothing, towels, stuffed toys, and bed linens should be washed in hot water and then dried in a dryer for at least 20 minutes on high heat. Items that cannot be washed should be enclosed in a plastic bag for at least 3 days.  To help relieve itching, bathe your child in a cool bath or apply cool washcloths to the affected areas.  Your child may return to school after treatment with the prescribed cream. SEEK MEDICAL CARE IF:   The itching persists longer than 4 weeks after treatment.  The rash spreads or becomes infected. Signs of infection include red blisters or yellow-tan crust. Document Released: 02/04/2005 Document Revised: 04/29/2011 Document Reviewed: 06/15/2008 [facility] Patient Information 2015 Chad Tanner, Chad Tanner. This information is not intended to replace advice given to you by your health care provider. Make sure you discuss any questions you have with your health care provider.    Emergency Department Resource Guide 1) Find a Doctor and Pay Out of Pocket Although you won't have to find out who is covered by your insurance plan, it is a good idea to ask around and get recommendations. You will then need to call the office and see if the doctor you have chosen will accept you as a new patient and what types of options they offer for patients who are self-pay. Some doctors offer discounts or will set up payment plans  for their patients who do not have insurance, but you will need to ask so you aren't surprised when you get to your appointment.  2) Contact Your Local Health Department Not all health departments have doctors that can see patients for sick visits, but many do, so it is worth a call to see if yours does. If you don't know where your local health department is, you can check in your phone book. The CDC also has a tool to help you locate your state's health department, and many  state websites also have listings of all of their local health departments.  3) Find a Walk-in Clinic If your illness is not likely to be very severe or complicated, you may want to try a walk in clinic. These are popping up all over the country in pharmacies, drugstores, and shopping centers. They're usually staffed by nurse practitioners or physician assistants that have been trained to treat common illnesses and complaints. They're usually fairly quick and inexpensive. However, if you have serious medical issues or chronic medical problems, these are probably not your best option.  No Primary Care Doctor: - Call Health Connect at  [phone] - they can help you locate a primary care doctor that  accepts your insurance, provides certain services, etc. - Physician Referral Service- [phone]  Chronic Pain Problems: Organization         Address  Phone   Notes  [facility]  [phone] Patients need to be referred by their primary care doctor.   Medication Assistance: Organization         Address  Phone   Notes  [facility] Medication [facility] [address] [place] [place], [place] [address] [phone] --Must be a resident of [facility] -- Must have NO insurance coverage whatsoever (no Medicaid/ Medicare, etc.) -- The pt. MUST have a primary care doctor that directs their care regularly and follows them in the community   [facility]  [phone]   [employer]  [phone]    Agencies that provide inexpensive medical care: Organization         Address  Phone   Notes  [facility]  [phone]   [facility]    [phone]   [facility] [address] [place], [place] [address] [phone]   [facility] [address] [place] [place] [phone]   [facility]anned Parenthood    [phone]   [facility]    [phone]   Community Health and  [facility]  [address], [place] Phone:  [phone], Fax:  [phone] Hours of Operation:  9 am - 6 pm, M-F.  Also accepts Medicaid/Medicare and self-pay.  [facility]  [address], [place] Phone: [phone], Fax: [phone]. Hours of Operation:  8:30 am - 5:30 pm, M-F.  Also accepts Medicaid and self-pay.  [facility] [place] [address], [place] Phone: [phone]   [facility] [address] Natasha Bence [place], [place] [phone], Ext. [phone] Mondays & Thursdays: 7-9 AM.  First 15 patients are seen on a first come, first serve basis.    Medicaid-accepting [facility] Providers:  Organization         Address  Phone   Notes  [facility] [address], [place] [phone] Also accepts self-pay patients.  [facility] [address] Laurell Josephs [place], [place]  [phone]   [facility] [address], [place] [phone]   [facility] [address], [place] [phone]   Renaye Rakers [address], [place]   [phone] Only accepts [place] Access [place] patients after they have their name applied to their card.   Self-Pay (no insurance) in [facility]:  Organization         Address  Phone   Notes  Sickle Cell Patients, [facility] [address] [place], [place] [phone]   [facility] [address] [place], [place] [phone]   [facility] [place]  [address], [place] [phone]   [facility]/Dr. Osei-Bonsu  [address] [place] or [address], [place] [phone] Phone number for both [place] and [place] locations is the same.  [facility] [address] [place] [phone]   [facility] [address], [place] or [address] [phone]   [facility] [address], [place] [phone], phone; [phone], fax Sees patients 1st and 3rd Saturday of every month.  Must not qualify for public or private insurance (i.e. Medicaid, Medicare, [place] Health Choice, Veterans' Benefits)  Household income should be no more than 200% of the poverty level The clinic cannot treat you if you are pregnant or think you are pregnant  Sexually transmitted diseases are not treated at the clinic.    Dental Care: Organization         Address  Phone  Notes  [facility] Department of [facility] [address] [place], [place] [phone] Accepts children up to age 25 who are enrolled in [place] or [place] Health Choice; pregnant women with a Medicaid card; and children who have applied for Medicaid or [place] Health Choice, but were declined, whose parents can pay a reduced fee at time of service.  [facility] Department of [facility]  [address], [place] [phone] Accepts children up to age 51 who are enrolled in [place] or [place] Health Choice; pregnant women with a Medicaid card; and children who have applied for Medicaid or [place] Health Choice, but were declined, whose parents can pay a reduced fee at time of service.  [facility]  [address] [place], [place] [phone] Patients are seen by appointment only. Walk-ins are not accepted. [facility] will see patients 24 years of age and older. Monday - Tuesday (8am-5pm) Most Wednesdays (8:30-5pm) $30 per visit, cash only  [facility] Adult [facility] Access [facility]  [address], [facility] [phone] Patients are seen by appointment only. Walk-ins are not accepted. [facility] will see patients 108 years of age and older. One Wednesday Evening (Monthly: Volunteer Based).  $30 per visit, cash only  [employer]  [phone] for adults; Children under age 16, call Graduate Pediatric Dentistry at [phone]. Children aged 74-14, please call [phone] to request a pediatric application.  Dental services are provided in all areas of dental care including fillings, crowns and bridges, complete and partial dentures, implants, gum treatment, root canals, and extractions. Preventive care is also provided. Treatment is provided to both adults  and children. Patients are selected via a lottery and there is often a waiting list.   [facility] [address], [place]  [phone] [email or web address]   [facility] [address] [place], [place] [phone], Ext. [phone] Second and Fourth Thursday of each month, opens at 6:30 AM; Clinic ends at 9 AM.  Patients are seen on a first-come first-served basis, and a limited number are seen during each clinic.   [facility]  [address] Ether Griffins [place], [place] [phone]   Eligibility Requirements You must have lived in [place], [place], or [place] counties for at least the last three months.   You cannot be eligible for state or federal sponsored [place], including [employer], [place], or [employer].   You generally cannot be eligible for healthcare insurance through your employer.    How to apply: Eligibility screenings are held every Tuesday and Wednesday afternoon from 1:00 pm until 4:00 pm. You do not need an appointment for the interview!  [facility] [address], [place], [place] [phone]   [facility] Health Department  [phone]   [facility] Department  [phone]   [facility] Health Department  [phone]    Behavioral Health Resources in the Community: Intensive Outpatient Programs Organization         Address  Phone  Notes  [facility] [address], [place], [place]  [phone]   [facility] [address], [place], [place] [phone]   [facility] [address], [place], [place]  [phone]   [facility] [address],  [place], [place] [phone] or [phone]   Substance Abuse Resources Organization         Address  Phone  Notes  [facility]  [phone]   [facility]  [phone]   [facility]  [phone]   Floydene Flock  [phone]   Residential & Outpatient Substance Abuse Program  [phone]   Psychological Services Organization         Address  Phone  Notes  [facility] Behavioral Health  [phone]   [facility]  [phone]   [facility] [address], [place] [phone] or [phone]    Mobile Crisis Teams Organization         Address  Phone  Notes  [facility], Mobile Crisis Care Unit  [phone]   [facility]  [address] [place], [place] [phone]   Doristine Locks [address] [place] [phone]    Self-Help/Support Groups Organization         Address  Phone             Notes  [facility] [place] - variety of support groups  [phone]- [phone] Call for more information  Narcotics Anonymous (NA), [facility] [address], [employer] [place]  2 meetings at this location   Statistician         Address  Phone  Notes  [facility] [address],    [place]  [phone]   [facility]  [address] [place] [address], [place], [place] [phone]   [facility] [address] [place], [place] [phone] Admissions: 8am-3pm M-F  [facility] [address],    [place], [place] [phone]   [facility] [address], [place], [place] [phone]   [facility] [address]    [place], [place] [phone]   [facility] - Intensive Outpatient [address], [place], [place] [phone]   [facility] ([facility] [address]  [place], [place] [phone] or [phone]   [facility] ([facility]) [address] [place], [place] [phone] Accepts Medicaid  [facility] [address]  [place] [phone] Substance Abuse/Addiction Treatment   [facility] Behavioral Health Resources Organization         Address  Phone  Notes  [facility]  [phone]   Angie FavaJulie Brannon, PhD [address] [place], [place]   [phone]) [phone] or [phone]) [phone]   [facility]   [address] [place], [place] [phone]) [phone]   [facility] [address], [place], [place] [phone]) [phone] Insurance/Medicaid/sponsorship through [facility]  [facility] [address]                                    [place], [place] [phone]) [phone] Therapy/tele-psych/case  [facility] [address]   [place], [place] [phone]    Dr. Lolly MustacheArfeen  [phone](336) [phone]   [facility]  [employer] [facility] 1) [address], [place] 2) [address], [place] 3)  [address], [place] [phone](336) [phone]  [phone](336) [phone]   [facility] Child Abuse Hotline [phone](336) [phone] or [phone] (After Hours)

## 2014-04-19 NOTE — ED Provider Notes (Signed)
CSN: [id number]     Arrival date & time 04/19/14  2229 History  This chart is scribed for non-physician practitioner, Dierdre ForthHannah Deaunte Dente, PA-C, working with Purvis SheffieldForrest Harrison, MD by Abel PrestoKara Demonbreun, ED Scribe.  This patient was seen in room TR08C/TR08C and the patient's care was started 11:30 PM.       Chief Complaint  Patient presents with  . Rash     Patient is a 24 y.o. male presenting with rash. The history is provided by the patient and medical records. No language interpreter was used.  Rash Associated symptoms: no abdominal pain, no fever, no joint pain, no myalgias, no nausea and not vomiting    HPI Comments: Levell Julydrian D Recker is a 24 y.o. male who presents to the Emergency Department complaining of worsening rash to chest, back, and arms with onset yesterday. Pt states he wore a friends jacket 2 days ago and rash began itching after.  Pt notes associated itching. Pt tried hydrocortisone cream for relief. Pt denies anyone in home having similar rash. Pt denies rash on legs and feet and chills, fever, nausea, and vomiting. No tick exposures  History reviewed. No pertinent past medical history. History reviewed. No pertinent past surgical history. No family history on file. History  Substance Use Topics  . Smoking status: Current Every Day Smoker  . Smokeless tobacco: Not on file  . Alcohol Use: Yes     Comment: daily    Review of Systems  Constitutional: Negative for fever and chills.  Respiratory: Negative for cough.   Cardiovascular: Negative for chest pain.  Gastrointestinal: Negative for nausea, vomiting and abdominal pain.  Musculoskeletal: Negative for myalgias and arthralgias.  Skin: Positive for rash.      Allergies  Review of patient's allergies indicates no known allergies.  Home Medications   Prior to Admission medications   Medication Sig Start Date End Date Taking? Authorizing Provider  clindamycin (CLEOCIN) 150 MG capsule Take 2 capsules (300 mg total)  by mouth 3 (three) times daily. May dispense as 150mg  capsules 09/05/13   Emilia BeckKaitlyn Szekalski, PA-C  hydrOXYzine (ATARAX/VISTARIL) 10 MG tablet Take 1 tablet (10 mg total) by mouth every 6 (six) hours as needed for itching. 04/19/14   Sahvanna Mcmanigal, PA-C  permethrin (ELIMITE) 5 % cream Apply to affected area once; repeat in 7 days if rash is not resolved 04/19/14   Dahlia ClientHannah Eri Platten, PA-C  traMADol (ULTRAM) 50 MG tablet Take 1 tablet (50 mg total) by mouth every 6 (six) hours as needed. 09/05/13   Kaitlyn Szekalski, PA-C   BP 138/92 mmHg  Pulse 82  Temp(Src) 97.9 F (36.6 C) (Oral)  Resp 18  Ht 5\' 7"  (1.702 m)  Wt 130 lb (58.968 kg)  BMI 20.36 kg/m2  SpO2 97% Physical Exam  Constitutional: He is oriented to person, place, and time. He appears well-developed and well-nourished. No distress.  Awake, alert, nontoxic appearance  HENT:  Head: Normocephalic and atraumatic.  Mouth/Throat: Oropharynx is clear and moist. No oropharyngeal exudate.  Eyes: Conjunctivae are normal. No scleral icterus.  Neck: Normal range of motion. Neck supple.  Cardiovascular: Normal rate, regular rhythm and intact distal pulses.   Pulmonary/Chest: Effort normal and breath sounds normal. No respiratory distress. He has no wheezes.  Equal chest expansion  Abdominal: Soft. Bowel sounds are normal. He exhibits no mass. There is no tenderness. There is no rebound and no guarding.  Musculoskeletal: Normal range of motion. He exhibits no edema.  Neurological: He is alert and oriented to  person, place, and time.  Speech is clear and goal oriented Moves extremities without ataxia  Skin: Skin is warm and dry. He is not diaphoretic.  Diffuse papular erythematous rash covering the bilateral arms, chest, back, and neck. Excoriations throughout without induration or evidence of secondary infection. No vesicles. Small burrows noted.   Psychiatric: He has a normal mood and affect.  Nursing note and vitals reviewed.   ED  Course  Procedures (including critical care time) DIAGNOSTIC STUDIES: Oxygen Saturation is 97% on room air, normal by my interpretation.    COORDINATION OF CARE: 11:33 PM Discussed treatment plan with patient at beside, the patient agrees with the plan and has no further questions at this time.   Labs Review Labs Reviewed - No data to display  Imaging Review No results found.   EKG Interpretation None      MDM   Final diagnoses:  Scabies   Chad Tanner presents with rash consistent with scabies.  Discussed diagnosis & treatment of scabies with parents.  They have been advised to followup with her primary care doctor 2 weeks after treatment.  They have also been advised to clean entire household including washing sheets and using R.I.D. spray in the car and on sofa.   The use of permethrin cream was discussed as well, they were told to use cream from head to toe & leave on for 8-12 hours.  They've been advised to repeat treatment if new eruptions occur.   I have personally reviewed patient's vitals, nursing note and any pertinent labs or imaging.  I performed an focused physical exam; undressed when appropriate .    It has been determined that no acute conditions requiring further emergency intervention are present at this time. The patient/guardian have been advised of the diagnosis and plan. I reviewed any labs and imaging including any potential incidental findings. We have discussed signs and symptoms that warrant return to the ED and they are listed in the discharge instructions.    Vital signs are stable at discharge.   BP 138/92 mmHg  Pulse 82  Temp(Src) 97.9 F (36.6 C) (Oral)  Resp 18  Ht  (1.702 m)  Wt 130 lb (58.968 kg)  BMI 20.36 kg/m2  SpO2 97%  I personally performed the services described in this documentation, which was scribed in my presence. The recorded information has been reviewed and is accurate.   Dahlia Client Coda Mathey, PA-C 04/20/14  9629  Purvis Sheffield, MD 04/21/14 1012

## 2014-04-19 NOTE — ED Notes (Signed)
Pt st's he wore someone else's jacket on Sunday and then started breaking out with a rash on arms, chest and back.  Pt c/o itching

## 2014-09-22 ENCOUNTER — Emergency Department (HOSPITAL_COMMUNITY)
Admission: EM | Admit: 2014-09-22 | Discharge: 2014-09-22 | Disposition: A | Discharge status: Home / Self Care | Payer: Medicaid Other | Attending: Physician Assistant | Admitting: Physician Assistant

## 2014-09-22 ENCOUNTER — Encounter (HOSPITAL_COMMUNITY): Payer: Self-pay | Admitting: *Deleted

## 2014-09-22 DIAGNOSIS — Z720 Tobacco use: Secondary | ICD-10-CM | POA: Insufficient documentation

## 2014-09-22 DIAGNOSIS — IMO0002 Reserved for concepts with insufficient information to code with codable children: Secondary | ICD-10-CM

## 2014-09-22 DIAGNOSIS — Z79899 Other long term (current) drug therapy: Secondary | ICD-10-CM | POA: Insufficient documentation

## 2014-09-22 DIAGNOSIS — Z4801 Encounter for change or removal of surgical wound dressing: Secondary | ICD-10-CM | POA: Insufficient documentation

## 2014-09-22 NOTE — ED Notes (Signed)
Pt had sutures (dissovable) put in left hand 3 weeks ago in [place]. Here today for note stating he is ok to return to work.

## 2014-09-22 NOTE — ED Notes (Signed)
Pt states that he would like his left hand laceration to be check. Pt states that he had dissolvable sutures placed 2-3 weeks ago.

## 2014-09-22 NOTE — ED Provider Notes (Signed)
CSN: [id number]     Arrival date & time 09/22/14  1200 History  This chart was scribed for non-physician practitioner Dierdre Forth, PA-C, working with Abelino Derrick, MD, by Tanda Rockers, ED Scribe. This patient was seen in room TR10C/TR10C and the patient's care was started at 1:28 PM.   Chief Complaint  Patient presents with  . Wound Check   The history is provided by the patient and medical records. No language interpreter was used.     HPI Comments: Chad Tanner is a 24 y.o. male who presents to the Emergency Department for wound check. Pt mentions that he cut his left hand approximately 3 weeks ago while at work. He was seen at [facility] and had dissolvable sutures placed at that time. He mentions that the stitches are still present, which concerned him and prompted him to come to the ED today. Denies fever, chills, weakness, numbness, or any other associated symptoms.   History reviewed. No pertinent past medical history. History reviewed. No pertinent past surgical history. No family history on file. History  Substance Use Topics  . Smoking status: Current Every Day Smoker  . Smokeless tobacco: Not on file  . Alcohol Use: Yes     Comment: daily    Review of Systems  Constitutional: Negative for fever and chills.  Gastrointestinal: Negative for nausea and vomiting.  Musculoskeletal: Negative for myalgias.  Skin: Positive for wound.  Allergic/Immunologic: Negative for immunocompromised state.  Neurological: Negative for weakness and numbness.  Hematological: Does not bruise/bleed easily.  Psychiatric/Behavioral: The patient is not nervous/anxious.    Allergies  Review of patient's allergies indicates no known allergies.  Home Medications   Prior to Admission medications   Medication Sig Start Date End Date Taking? Authorizing Provider  clindamycin (CLEOCIN) 150 MG capsule Take 2 capsules (300 mg total) by mouth 3 (three) times daily. May  dispense as  capsules 09/05/13   Emilia Beck, PA-C  hydrOXYzine (ATARAX/VISTARIL) 10 MG tablet Take 1 tablet (10 mg total) by mouth every 6 (six) hours as needed for itching. 04/19/14   Cage Gupton, PA-C  permethrin (ELIMITE) 5 % cream Apply to affected area once; repeat in 7 days if rash is not resolved 04/19/14   Dahlia Client Bakary Bramer, PA-C  traMADol (ULTRAM) 50 MG tablet Take 1 tablet (50 mg total) by mouth every 6 (six) hours as needed. 09/05/13   Emilia Beck, PA-C   Triage Vitals: BP 144/87 mmHg  Pulse 73  Temp(Src) 97.6 F (36.4 C) (Oral)  Resp 16  SpO2 95%   Physical Exam  Constitutional: He is oriented to person, place, and time. He appears well-developed and well-nourished. No distress.  HENT:  Head: Normocephalic and atraumatic.  Eyes: Conjunctivae are normal. No scleral icterus.  Neck: Normal range of motion.  Cardiovascular: Normal rate, regular rhythm, normal heart sounds and intact distal pulses.   No murmur heard. Capillary refill < 3 sec  Pulmonary/Chest: Effort normal and breath sounds normal. No respiratory distress.  Musculoskeletal: Normal range of motion. He exhibits no edema.  ROM: Full ROM of all fingers of the left hand including the MCP join of the left ring finger No tenderness to palpation of any of the joints  Neurological: He is alert and oriented to person, place, and time.  Sensation: intact Strength: 5/5 including grip  Skin: Skin is warm and dry. He is not diaphoretic.  Healing U shaped laceration over the left MCP with granulation tissue visible  No dehisence  Psychiatric: He has a normal mood and affect.  Nursing note and vitals reviewed.   ED Course  Procedures (including critical care time)  DIAGNOSTIC STUDIES: Oxygen Saturation is 95% on RA, normal by my interpretation.    COORDINATION OF CARE: 1:29 PM-Discussed treatment plan with pt at bedside and pt agreed to plan.   Labs Review Labs Reviewed - No data to  display  Imaging Review No results found.   EKG Interpretation None      MDM   Final diagnoses:  Encounter for re-check of laceration wound   Chad Tanner presents for wound check and note to return to work. Wound is healing well. Granulation tissue in place. No redness or erythema to suggest signs of infection.  Patient does heavy lifting at work. Will keep him on light duty for one week until wound is completely healed as it is currently over the MCP. He may return to work in one week.  BP 135/74 mmHg  Pulse 71  Temp(Src) 98 F (36.7 C) (Oral)  Resp 12  Ht 5\' 7"  (1.702 m)  Wt 130 lb (58.968 kg)  BMI 20.36 kg/m2  SpO2 100%  I personally performed the services described in this documentation, which was scribed in my presence. The recorded information has been reviewed and is accurate.    Dahlia Client Samah Lapiana, PA-C 09/22/14 1522  Courteney Randall An, MD 09/23/14 [phone]

## 2014-09-22 NOTE — Discharge Instructions (Signed)
1. Medications: usual home medications 2. Treatment: rest, drink plenty of fluids,  3. Follow Up: Please followup with your primary doctor in as needed days for discussion of your diagnoses and further evaluation after today's visit; if you do not have a primary care doctor use the resource guide provided to find one;     Suture Removal, Care After Refer to this sheet in the next few weeks. These instructions provide you with information on caring for yourself after your procedure. Your health care provider may also give you more specific instructions. Your treatment has been planned according to current medical practices, but problems sometimes occur. Call your health care provider if you have any problems or questions after your procedure. WHAT TO EXPECT AFTER THE PROCEDURE After your stitches (sutures) are removed, it is typical to have the following:  Some discomfort and swelling in the wound area.  Slight redness in the area. HOME CARE INSTRUCTIONS   If you have skin adhesive strips over the wound area, do not take the strips off. They will fall off on their own in a few days. If the strips remain in place after 14 days, you may remove them.  Change any bandages (dressings) at least once a day or as directed by your health care provider. If the bandage sticks, soak it off with warm, soapy water.  Apply cream or ointment only as directed by your health care provider. If using cream or ointment, wash the area with soap and water 2 times a day to remove all the cream or ointment. Rinse off the soap and pat the area dry with a clean towel.  Keep the wound area dry and clean. If the bandage becomes wet or dirty, or if it develops a bad smell, change it as soon as possible.  Continue to protect the wound from injury.  Use sunscreen when out in the sun. New scars become sunburned easily. SEEK MEDICAL CARE IF:  You have increasing redness, swelling, or pain in the wound.  You see pus  coming from the wound.  You have a fever.  You notice a bad smell coming from the wound or dressing.  Your wound breaks open (edges not staying together). Document Released: 10/30/2000 Document Revised: 11/25/2012 Document Reviewed: 09/16/2012 [facility] Patient Information 2015 [place], [place]. This information is not intended to replace advice given to you by your health care provider. Make sure you discuss any questions you have with your health care provider.    Emergency Department Resource Guide 1) Find a Doctor and Pay Out of Pocket Although you won't have to find out who is covered by your insurance plan, it is a good idea to ask around and get recommendations. You will then need to call the office and see if the doctor you have chosen will accept you as a new patient and what types of options they offer for patients who are self-pay. Some doctors offer discounts or will set up payment plans for their patients who do not have insurance, but you will need to ask so you aren't surprised when you get to your appointment.  2) Contact Your Local Health Department Not all health departments have doctors that can see patients for sick visits, but many do, so it is worth a call to see if yours does. If you don't know where your local health department is, you can check in your phone book. The CDC also has a tool to help you locate your state's health department, and  many state websites also have listings of all of their local health departments.  3) Find a [facility] If your illness is not likely to be very severe or complicated, you may want to try a walk in clinic. These are popping up all over the country in pharmacies, drugstores, and shopping centers. They're usually staffed by nurse practitioners or physician assistants that have been trained to treat common illnesses and complaints. They're usually fairly quick and inexpensive. However, if you have serious medical issues or chronic  medical problems, these are probably not your best option.  No Primary Care Doctor: - Call Health Connect at  [phone] - they can help you locate a primary care doctor that  accepts your insurance, provides certain services, etc. - Physician Referral Service- [phone]  Chronic Pain Problems: Organization         Address  Phone   Notes  [facility]  [phone] Patients need to be referred by their primary care doctor.   Medication Assistance: Organization         Address  Phone   Notes  [facility] Medication [facility] [address] [place], [place] [address] [phone] --Must be a resident of [facility] -- Must have NO insurance coverage whatsoever (no Medicaid/ Medicare, etc.) -- The pt. MUST have a primary care doctor that directs their care regularly and follows them in the community   MedAssist  [phone]   [employer]  [phone]    Agencies that provide inexpensive medical care: Organization         Address  Phone   Notes  [facility]  [phone]   [facility]    [phone]   [facility] [place],  [phone]   [place] [address], [place] [phone]   Planned Parenthood    [phone]   [facility]    [phone]   [place] and [place] [address], [place] Phone:  [phone], Fax:  [phone] Hours of Operation:  9 am - 6 pm, M-F.  Also accepts Medicaid/Medicare and self-pay.  [facility] for [place] [address], [address], [place] Phone: [phone], Fax: [phone]. Hours of Operation:  8:30 am - 5:30 pm, M-F.  Also accepts Medicaid and self-pay.  [facility] [place] [address] [place] Phone: [phone]   [place], [place], [place] [phone],  Ext. [phone] Mondays & Thursdays: 7-9 AM.  First 15 patients are seen on a first come, first serve basis.    [place] Providers:  Organization         Address  Phone   Notes  [facility] [address], Regino Ramirez [phone] Also accepts self-pay patients.  [facility] [id number] [address], [place]  [phone]   [place], [address], [place] [phone]   [facility] [address] [place] [phone]   Lucianne Lei [address], [place]   [phone] Only accepts [place] Access [place] patients after they have their name applied to their card.   Self-Pay (no insurance) in [facility]:  Organization  Address  Phone   Notes  Sickle Cell Patients, [facility] [place] [phone]   [facility] [place] [phone]   [facility] [place]  [address], [address], [place] [phone]   [facility]/Dr. Osei-Bonsu  [address] [place] or [address], [place] [phone] Phone number for both [place] and [place] locations is the same.  [facility] [address] [place] [phone]   [facility] [address] [place] or [address] [phone]   [facility] [address], [place] [phone], phone; [phone], fax Sees patients 1st and 3rd Saturday of every month.  Must not qualify for public or private insurance (i.e. Medicaid, Medicare, [place] Health Choice, Veterans' Benefits)  Household income should be no more than 200% of the poverty level The clinic cannot treat you if you are pregnant or think you are pregnant  Sexually transmitted diseases are not  treated at the clinic.    Dental Care: Organization         Address  Phone  Notes  [facility] Department of [facility] [place] [phone] Accepts children up to age 47 who are enrolled in [place] or [place]; pregnant women with a Medicaid card; and children who have applied for Medicaid or [place] Health Choice, but were declined, whose parents can pay a reduced fee at time of service.  [facility] Department of [facility]  [address], [place] [phone] Accepts children up to age 54 who are enrolled in [place] or [place]; pregnant women with a Medicaid card; and children who have applied for Medicaid or [place] Health Choice, but were declined, whose parents can pay a reduced fee at time of service.  [place] Adult Dental Access PROGRAM  [place] [phone] Patients are seen by appointment only. Walk-ins are not accepted. [place] will see patients 66 years of age and older. Monday - Tuesday (8am-5pm) Most Wednesdays (8:30-5pm) $30 per visit, cash only  [facility] Adult Dental Access PROGRAM  [address], [facility] [phone] Patients are seen by appointment only. Walk-ins are not accepted. [place] will see patients 62 years of age and older. One Wednesday Evening (Monthly: Volunteer Based).  $30 per visit, cash only  Trystian Crisanto  [phone] for adults; Children under age 72, call Graduate Pediatric Dentistry at [phone]. Children aged 60-14, please call [phone] to request a pediatric application.  Dental services are provided in all areas of dental care including fillings, crowns and bridges, complete and partial dentures, implants, gum treatment, root canals, and extractions. Preventive care is also provided. Treatment is provided to both adults and children. Patients are selected via a lottery and there is  often a waiting list.   [facility] [address], [place]  [phone] [email or web address]   [facility] [address] [place], [place] [phone], Ext. [phone] Second and Fourth Thursday of each month, opens at 6:30 AM; Clinic ends at 9 AM.  Patients are seen on a first-come first-served basis, and a limited number are seen during each clinic.   [facility]  [address] [place], [place]  [place], [place] [phone]   Eligibility Requirements You must have lived in [place], [place], or [place] counties for at least the last three months.   You cannot be eligible for state or federal sponsored [employer], including [employer], [place], or [employer].   You generally cannot be eligible for healthcare insurance through your employer.    How to apply: Eligibility screenings are held every Tuesday and Wednesday afternoon from 1:00 pm until 4:00 pm. You do not need an appointment for the interview!  [facility] [address], [place], [place]   [place]  [place] Department  [place]  [phone]    Behavioral Health Resources in the Community: Intensive Outpatient Programs Organization         Address  Phone  Notes  [place]. [address] [place], [place] [phone]   [facility] [address] [place], [place]   ADS: [facility] Drug S[facility] [address] [place], [place]   [place] [address]  [place], [place] or [phone]   Substance Abuse Resources Organization         Address  Phone  Notes  Alcohol and Drug Services  [phone]   [place]  [phone]   [facility] [place]   Chinita Pester  [phone]   Residential & Outpatient Substance  Abuse Program  [phone]   Psychological Services Organization         Address  Phone  Notes  [facility]  [place]  [phone]   [place] [address], [place] or [phone]    Mobile Crisis Teams Organization         Address  Phone  Notes  [facility], Mobile Crisis Care Unit  [phone]   [facility]  [address] [place], [place]   [place] [address] [place] [phone]    Self-Help/Support Groups Organization         Address  Phone             Notes  [place]. of [place] - variety of support groups  [place] Call for more information  Narcotics Anonymous (NA), [facility] [address], [employer] [place]  2 meetings at this location   Special educational needs teacher         Address  Phone  Notes  ASAP Residential Treatment [place],    [place]  [phone]   [facility]  [address], [place] [id number], [place], [place]   [place], [place] [phone] Admissions: 8am-3pm M-F  Incentives Substance [place] [address],    [place], [place] [phone]   [facility] [address] Jadene Pierini [place], [place]   [facility] [address]  [place], [place]   Insight Programs - Intensive Outpatient [address] Kristeen [address], [place], [place]   [facility] ([place] [place].,  [place], [place] or [phone]   [facility] (RTS) [address], [place], [place] Accepts Medicaid  [facility] [address],  [place] [phone] Substance Abuse/Addiction Treatment   [facility] Resources Organization  Address  Phone  Notes  °[facility]  ([phone]) [phone]   °Julie Brannon, PhD [address] [place], [place]   ([phone]) [phone] or (336) [phone]   °[facility]   [address] °[place], [place] [phone]   °[facility] [address], [place], [place] ([phone] Insurance/Medicaid/sponsorship through [facility]  °[facility] [address]                                    [place], [place] [phone] Therapy/tele-psych/case  °[facility] [address]  ° [place], [place] [phone]    °Dr. Arfeen  ([phone]   °[facility] [place]  [employer] [place] [facility] 1) [address], [place] °2) [address], [place] °3)  [address], [place] ([phone]) [phone] °([phone]) [phone] ° °([phone]) [phone]   °[place] Child Abuse Hotline (336) 342-1394 or (336) 342-3537 (After Hours)    ° ° ° °

## 2014-09-22 NOTE — ED Notes (Signed)
Declined W/C at D/C and was escorted to lobby by RN. 

## 2014-10-09 ENCOUNTER — Emergency Department (HOSPITAL_COMMUNITY)
Admission: EM | Admit: 2014-10-09 | Discharge: 2014-10-09 | Disposition: A | Discharge status: Home / Self Care | Payer: Medicaid Other | Attending: Emergency Medicine | Admitting: Emergency Medicine

## 2014-10-09 ENCOUNTER — Encounter (HOSPITAL_COMMUNITY): Payer: Self-pay | Admitting: Emergency Medicine

## 2014-10-09 DIAGNOSIS — S61411A Laceration without foreign body of right hand, initial encounter: Secondary | ICD-10-CM | POA: Insufficient documentation

## 2014-10-09 DIAGNOSIS — IMO0002 Reserved for concepts with insufficient information to code with codable children: Secondary | ICD-10-CM

## 2014-10-09 DIAGNOSIS — Y998 Other external cause status: Secondary | ICD-10-CM | POA: Insufficient documentation

## 2014-10-09 DIAGNOSIS — Z720 Tobacco use: Secondary | ICD-10-CM | POA: Insufficient documentation

## 2014-10-09 DIAGNOSIS — S61511A Laceration without foreign body of right wrist, initial encounter: Secondary | ICD-10-CM | POA: Insufficient documentation

## 2014-10-09 DIAGNOSIS — W25XXXA Contact with sharp glass, initial encounter: Secondary | ICD-10-CM | POA: Insufficient documentation

## 2014-10-09 DIAGNOSIS — Y92009 Unspecified place in unspecified non-institutional (private) residence as the place of occurrence of the external cause: Secondary | ICD-10-CM | POA: Insufficient documentation

## 2014-10-09 DIAGNOSIS — Y9389 Activity, other specified: Secondary | ICD-10-CM | POA: Insufficient documentation

## 2014-10-09 MED ORDER — LIDOCAINE HCL (PF) 1 % IJ SOLN
5.0000 mL | Freq: Once | INTRAMUSCULAR | Status: DC
  Filled 2014-10-09: qty 5

## 2014-10-09 NOTE — ED Notes (Signed)
Bed: WLPT2 Expected date:  Expected time:  Means of arrival:  Comments: EMS laceration to hand / ETOH

## 2014-10-09 NOTE — Discharge Instructions (Signed)
Keep the sutures clean and dried.  They should be removed in 10 days.  Return for any sign of infection, redness, pain, puss
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
## 2018-03-07 ENCOUNTER — Emergency Department (HOSPITAL_COMMUNITY)
Admission: EM | Admit: 2018-03-07 | Discharge: 2018-03-07 | Disposition: A | Discharge status: Home / Self Care | Payer: Self-pay | Attending: Emergency Medicine | Admitting: Emergency Medicine

## 2018-03-07 ENCOUNTER — Encounter (HOSPITAL_COMMUNITY): Payer: Self-pay | Admitting: Emergency Medicine

## 2018-03-07 ENCOUNTER — Emergency Department (HOSPITAL_COMMUNITY): Payer: Self-pay

## 2018-03-07 ENCOUNTER — Other Ambulatory Visit: Payer: Self-pay

## 2018-03-07 DIAGNOSIS — S02601A Fracture of unspecified part of body of right mandible, initial encounter for closed fracture: Secondary | ICD-10-CM | POA: Insufficient documentation

## 2018-03-07 DIAGNOSIS — F17210 Nicotine dependence, cigarettes, uncomplicated: Secondary | ICD-10-CM | POA: Insufficient documentation

## 2018-03-07 DIAGNOSIS — Y929 Unspecified place or not applicable: Secondary | ICD-10-CM | POA: Insufficient documentation

## 2018-03-07 DIAGNOSIS — Y999 Unspecified external cause status: Secondary | ICD-10-CM | POA: Insufficient documentation

## 2018-03-07 DIAGNOSIS — S02609A Fracture of mandible, unspecified, initial encounter for closed fracture: Secondary | ICD-10-CM

## 2018-03-07 DIAGNOSIS — S02602A Fracture of unspecified part of body of left mandible, initial encounter for closed fracture: Secondary | ICD-10-CM | POA: Insufficient documentation

## 2018-03-07 DIAGNOSIS — Y939 Activity, unspecified: Secondary | ICD-10-CM | POA: Insufficient documentation

## 2018-03-07 LAB — COMPREHENSIVE METABOLIC PANEL
ALK PHOS: 44 U/L (ref 38–126)
ALT: 16 U/L (ref 0–44)
AST: 27 U/L (ref 15–41)
Albumin: 4.5 g/dL (ref 3.5–5.0)
Anion gap: 14 (ref 5–15)
BUN: 8 mg/dL (ref 6–20)
CALCIUM: 9.1 mg/dL (ref 8.9–10.3)
CO2: 22 mmol/L (ref 22–32)
Chloride: 102 mmol/L (ref 98–111)
Creatinine, Ser: 1.17 mg/dL (ref 0.61–1.24)
GFR calc Af Amer: >60 mL/min (ref >60)
GFR calc non Af Amer: >60 mL/min (ref >60)
GLUCOSE: 107 mg/dL — AB (ref 70–99)
POTASSIUM: 3.6 mmol/L (ref 3.5–5.1)
Sodium: 138 mmol/L (ref 135–145)
Total Bilirubin: 0.8 mg/dL (ref 0.3–1.2)
Total Protein: 7.2 g/dL (ref 6.5–8.1)

## 2018-03-07 LAB — CBC
HCT: 43 % (ref 39.0–52.0)
Hemoglobin: 14.5 g/dL (ref 13.0–17.0)
MCH: 30.9 pg (ref 26.0–34.0)
MCHC: 33.7 g/dL (ref 30.0–36.0)
MCV: 91.5 fL (ref 80.0–100.0)
Platelets: 332 10*3/uL (ref 150–400)
RBC: 4.7 MIL/uL (ref 4.22–5.81)
RDW: 14.3 % (ref 11.5–15.5)
WBC: 11.1 10*3/uL — AB (ref 4.0–10.5)
nRBC: 0 % (ref 0.0–0.2)

## 2018-03-07 MED ORDER — OXYCODONE HCL 5 MG PO TABS: 5.0000 mg | ORAL_TABLET | ORAL | 0 refills | Status: DC | PRN

## 2018-03-07 MED ORDER — SODIUM CHLORIDE 0.9 % IV BOLUS
1000.0000 mL | Freq: Once | INTRAVENOUS | Status: AC
  Administered 2018-03-07: 1000 mL via INTRAVENOUS

## 2018-03-07 MED ORDER — OXYCODONE HCL 5 MG PO TABS
10.0000 mg | ORAL_TABLET | Freq: Once | ORAL | Status: AC
  Administered 2018-03-07: 10 mg via ORAL
  Filled 2018-03-07: qty 2

## 2018-03-07 MED ORDER — HYDROMORPHONE HCL 1 MG/ML IJ SOLN
1.0000 mg | Freq: Once | INTRAMUSCULAR | Status: AC
  Administered 2018-03-07: 1 mg via INTRAVENOUS
  Filled 2018-03-07: qty 1

## 2018-03-07 MED ORDER — AMOXICILLIN-POT CLAVULANATE 875-125 MG PO TABS: 1.0000 | ORAL_TABLET | Freq: Two times a day (BID) | ORAL | 0 refills | Status: DC

## 2018-03-07 MED ORDER — SODIUM CHLORIDE 0.9 % IV SOLN
3.0000 g | Freq: Once | INTRAVENOUS | Status: AC
  Administered 2018-03-07: 3 g via INTRAVENOUS
  Filled 2018-03-07: qty 3
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
## 2018-03-07 NOTE — Discharge Instructions (Addendum)
You were evaluated in the Emergency Department and after careful evaluation, we did not find any emergent condition requiring admission or further testing in the hospital.  Your symptoms today seem to be due to a broken jaw bone.  Your case was discussed with Dr. Leta Baptist, who plans to call you tomorrow or the next day and see you in his clinic on Tuesday.  Please use the pain medication provided at home to help control your pain, to help you drink fluids and stay hydrated.  It is important that you eat only soft foods such as milkshakes but without any nuts or solids.  We are also given you a prescription for Augmentin, which is an antibiotic, please use as directed.  If you do not hear from Dr. Leta Baptist by Tuesday, please call so that you can be seen early this coming week.  Please return to the Emergency Department if you experience any worsening of your condition.  We encourage you to follow up with a primary care provider.  Thank you for allowing [place] to be a part of your care.

## 2018-03-07 NOTE — ED Notes (Signed)
Patient transported to CT 

## 2018-03-07 NOTE — ED Notes (Signed)
Off the floor to radiology

## 2018-03-09 ENCOUNTER — Telehealth: Payer: Self-pay | Admitting: Plastic Surgery

## 2018-03-09 NOTE — Telephone Encounter (Signed)
Left VM for patient, attempting to make clinic visit this week.Explained office closed today, provided contact number.
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
## 2018-03-12 ENCOUNTER — Other Ambulatory Visit: Payer: Self-pay

## 2018-03-12 ENCOUNTER — Encounter (HOSPITAL_COMMUNITY): Payer: Self-pay | Admitting: *Deleted

## 2018-03-13 ENCOUNTER — Ambulatory Visit (HOSPITAL_COMMUNITY): Payer: Self-pay | Admitting: Certified Registered Nurse Anesthetist

## 2018-03-13 ENCOUNTER — Encounter (HOSPITAL_COMMUNITY)
Admission: RE | Disposition: A | Discharge status: Home / Self Care | Payer: Self-pay | Source: Home / Self Care | Attending: Plastic Surgery

## 2018-03-13 ENCOUNTER — Encounter (HOSPITAL_COMMUNITY): Payer: Self-pay | Admitting: Surgery

## 2018-03-13 ENCOUNTER — Ambulatory Visit (HOSPITAL_COMMUNITY)
Admission: RE | Admit: 2018-03-13 | Discharge: 2018-03-13 | Disposition: A | Discharge status: Home / Self Care | Payer: Self-pay | Attending: Plastic Surgery | Admitting: Plastic Surgery

## 2018-03-13 DIAGNOSIS — S0266XB Fracture of symphysis of mandible, initial encounter for open fracture: Secondary | ICD-10-CM | POA: Insufficient documentation

## 2018-03-13 DIAGNOSIS — F17200 Nicotine dependence, unspecified, uncomplicated: Secondary | ICD-10-CM | POA: Insufficient documentation

## 2018-03-13 DIAGNOSIS — K029 Dental caries, unspecified: Secondary | ICD-10-CM | POA: Insufficient documentation

## 2018-03-13 DIAGNOSIS — S02642A Fracture of ramus of left mandible, initial encounter for closed fracture: Secondary | ICD-10-CM | POA: Insufficient documentation

## 2018-03-13 HISTORY — DX: Other specified health status: Z78.9

## 2018-03-13 HISTORY — PX: ORIF MANDIBULAR FRACTURE: SHX2127

## 2018-03-13 SURGERY — OPEN REDUCTION INTERNAL FIXATION (ORIF) MANDIBULAR FRACTURE
Anesthesia: General | Site: Mouth

## 2018-03-13 MED ORDER — LACTATED RINGERS IV SOLN
INTRAVENOUS | Status: DC | PRN
  Administered 2018-03-13 (×2): via INTRAVENOUS

## 2018-03-13 MED ORDER — BUPIVACAINE-EPINEPHRINE 0.25% -1:200000 IJ SOLN
INTRAMUSCULAR | Status: DC | PRN
  Administered 2018-03-13: 7 mL

## 2018-03-13 MED ORDER — PROPOFOL 10 MG/ML IV BOLUS
INTRAVENOUS | Status: DC | PRN
  Administered 2018-03-13: 200 mg via INTRAVENOUS

## 2018-03-13 MED ORDER — CEFAZOLIN SODIUM-DEXTROSE 2-4 GM/100ML-% IV SOLN
2.0000 g | INTRAVENOUS | Status: AC
  Administered 2018-03-13: 2 g via INTRAVENOUS
  Filled 2018-03-13: qty 100

## 2018-03-13 MED ORDER — PROMETHAZINE HCL 25 MG/ML IJ SOLN: 6.2500 mg | INTRAMUSCULAR | Status: DC | PRN

## 2018-03-13 MED ORDER — FENTANYL CITRATE (PF) 250 MCG/5ML IJ SOLN
INTRAMUSCULAR | Status: DC | PRN
  Administered 2018-03-13 (×2): 50 ug via INTRAVENOUS
  Administered 2018-03-13: 100 ug via INTRAVENOUS
  Administered 2018-03-13: 50 ug via INTRAVENOUS

## 2018-03-13 MED ORDER — ROCURONIUM BROMIDE 100 MG/10ML IV SOLN
INTRAVENOUS | Status: DC | PRN
  Administered 2018-03-13: 50 mg via INTRAVENOUS

## 2018-03-13 MED ORDER — MIDAZOLAM HCL 2 MG/2ML IJ SOLN
INTRAMUSCULAR | Status: DC | PRN
  Administered 2018-03-13: 2 mg via INTRAVENOUS

## 2018-03-13 MED ORDER — SUGAMMADEX SODIUM 200 MG/2ML IV SOLN
INTRAVENOUS | Status: DC | PRN
  Administered 2018-03-13: 120 mg via INTRAVENOUS

## 2018-03-13 MED ORDER — LIDOCAINE 2% (20 MG/ML) 5 ML SYRINGE
INTRAMUSCULAR | Status: AC
  Filled 2018-03-13: qty 5

## 2018-03-13 MED ORDER — DEXMEDETOMIDINE HCL IN NACL 200 MCG/50ML IV SOLN
INTRAVENOUS | Status: DC | PRN
  Administered 2018-03-13 (×5): 8 ug via INTRAVENOUS

## 2018-03-13 MED ORDER — OXYCODONE HCL 5 MG PO TABS
ORAL_TABLET | ORAL | Status: AC
  Filled 2018-03-13: qty 1

## 2018-03-13 MED ORDER — PROPOFOL 10 MG/ML IV BOLUS
INTRAVENOUS | Status: AC
  Filled 2018-03-13: qty 40

## 2018-03-13 MED ORDER — GLYCOPYRROLATE PF 0.2 MG/ML IJ SOSY
PREFILLED_SYRINGE | INTRAMUSCULAR | Status: AC
  Filled 2018-03-13: qty 1

## 2018-03-13 MED ORDER — FENTANYL CITRATE (PF) 250 MCG/5ML IJ SOLN
INTRAMUSCULAR | Status: AC
  Filled 2018-03-13: qty 5

## 2018-03-13 MED ORDER — OXYMETAZOLINE HCL 0.05 % NA SOLN
NASAL | Status: AC
  Filled 2018-03-13: qty 15

## 2018-03-13 MED ORDER — ONDANSETRON HCL 4 MG/2ML IJ SOLN
INTRAMUSCULAR | Status: DC | PRN
  Administered 2018-03-13: 4 mg via INTRAVENOUS

## 2018-03-13 MED ORDER — DEXMEDETOMIDINE HCL IN NACL 200 MCG/50ML IV SOLN
INTRAVENOUS | Status: AC
  Filled 2018-03-13: qty 50

## 2018-03-13 MED ORDER — DEXAMETHASONE SODIUM PHOSPHATE 10 MG/ML IJ SOLN
INTRAMUSCULAR | Status: AC
  Filled 2018-03-13: qty 1

## 2018-03-13 MED ORDER — ONDANSETRON HCL 4 MG/2ML IJ SOLN
INTRAMUSCULAR | Status: AC
  Filled 2018-03-13: qty 2

## 2018-03-13 MED ORDER — GLYCOPYRROLATE PF 0.2 MG/ML IJ SOSY
PREFILLED_SYRINGE | INTRAMUSCULAR | Status: DC | PRN
  Administered 2018-03-13 (×2): .1 mg via INTRAVENOUS

## 2018-03-13 MED ORDER — ROCURONIUM BROMIDE 50 MG/5ML IV SOSY
PREFILLED_SYRINGE | INTRAVENOUS | Status: AC
  Filled 2018-03-13: qty 5

## 2018-03-13 MED ORDER — DOUBLE ANTIBIOTIC 500-10000 UNIT/GM EX OINT
TOPICAL_OINTMENT | CUTANEOUS | Status: AC
  Filled 2018-03-13: qty 1

## 2018-03-13 MED ORDER — OXYCODONE HCL 5 MG PO TABS: 5.0000 mg | ORAL_TABLET | ORAL | 0 refills | Status: DC | PRN

## 2018-03-13 MED ORDER — OXYCODONE HCL 5 MG/5ML PO SOLN
ORAL | Status: AC
  Filled 2018-03-13: qty 5

## 2018-03-13 MED ORDER — OXYCODONE HCL 5 MG PO TABS: 5.0000 mg | ORAL_TABLET | Freq: Once | ORAL | Status: AC | PRN

## 2018-03-13 MED ORDER — HYDROMORPHONE HCL 1 MG/ML IJ SOLN
INTRAMUSCULAR | Status: AC
  Administered 2018-03-13: 0.5 mg via INTRAVENOUS
  Filled 2018-03-13: qty 1

## 2018-03-13 MED ORDER — DEXAMETHASONE SODIUM PHOSPHATE 10 MG/ML IJ SOLN
INTRAMUSCULAR | Status: DC | PRN
  Administered 2018-03-13: 8 mg via INTRAVENOUS

## 2018-03-13 MED ORDER — HYDROMORPHONE HCL 1 MG/ML IJ SOLN
0.2500 mg | INTRAMUSCULAR | Status: DC | PRN
  Administered 2018-03-13 (×3): 0.5 mg via INTRAVENOUS

## 2018-03-13 MED ORDER — HYDROMORPHONE HCL 1 MG/ML IJ SOLN
INTRAMUSCULAR | Status: AC
  Filled 2018-03-13: qty 1

## 2018-03-13 MED ORDER — MIDAZOLAM HCL 2 MG/2ML IJ SOLN
INTRAMUSCULAR | Status: AC
  Filled 2018-03-13: qty 2

## 2018-03-13 MED ORDER — BUPIVACAINE-EPINEPHRINE (PF) 0.25% -1:200000 IJ SOLN
INTRAMUSCULAR | Status: AC
  Filled 2018-03-13: qty 30

## 2018-03-13 MED ORDER — 0.9 % SODIUM CHLORIDE (POUR BTL) OPTIME
TOPICAL | Status: DC | PRN
  Administered 2018-03-13: 1000 mL

## 2018-03-13 MED ORDER — OXYMETAZOLINE HCL 0.05 % NA SOLN
NASAL | Status: DC | PRN
  Administered 2018-03-13: 1 via NASAL

## 2018-03-13 MED ORDER — LIDOCAINE HCL (CARDIAC) PF 100 MG/5ML IV SOSY
PREFILLED_SYRINGE | INTRAVENOUS | Status: DC | PRN
  Administered 2018-03-13: 60 mg via INTRAVENOUS

## 2018-03-13 MED ORDER — OXYCODONE HCL 5 MG/5ML PO SOLN
5.0000 mg | Freq: Once | ORAL | Status: AC | PRN
  Administered 2018-03-13: 5 mg via ORAL

## 2018-03-13 MED ORDER — MEPERIDINE HCL 50 MG/ML IJ SOLN: 6.2500 mg | INTRAMUSCULAR | Status: DC | PRN

## 2018-03-13 SURGICAL SUPPLY — 47 items
BLADE 10 SAFETY STRL DISP (BLADE) ×1 IMPLANT
BLADE CLIPPER SURG (BLADE) ×2 IMPLANT
BLADE SURG 15 STRL LF DISP TIS (BLADE) ×1 IMPLANT
BLADE SURG 15 STRL SS (BLADE) ×3
CANISTER SUCT 3000ML PPV (MISCELLANEOUS) ×3 IMPLANT
CLEANER TIP ELECTROSURG 2X2 (MISCELLANEOUS) ×1 IMPLANT
COVER SURGICAL LIGHT HANDLE (MISCELLANEOUS) ×3 IMPLANT
COVER WAND RF STERILE (DRAPES) ×1 IMPLANT
DECANTER SPIKE VIAL GLASS SM (MISCELLANEOUS) ×3 IMPLANT
ELECT COATED BLADE 2.86 ST (ELECTRODE) ×1 IMPLANT
ELECT REM PT RETURN 9FT ADLT (ELECTROSURGICAL) ×3
ELECTRODE REM PT RTRN 9FT ADLT (ELECTROSURGICAL) ×1 IMPLANT
GLOVE BIO SURGEON STRL SZ 6 (GLOVE) ×3 IMPLANT
GLOVE SURG SS PI 6.0 STRL IVOR (GLOVE) ×3 IMPLANT
GOWN STRL REUS W/ TWL LRG LVL3 (GOWN DISPOSABLE) ×2 IMPLANT
GOWN STRL REUS W/TWL LRG LVL3 (GOWN DISPOSABLE) ×9
KIT BASIN OR (CUSTOM PROCEDURE TRAY) ×3 IMPLANT
KIT TURNOVER KIT B (KITS) ×3 IMPLANT
NDL PRECISIONGLIDE 27X1.5 (NEEDLE) ×1 IMPLANT
NEEDLE PRECISIONGLIDE 27X1.5 (NEEDLE) IMPLANT
NS IRRIG 1000ML POUR BTL (IV SOLUTION) ×3 IMPLANT
PAD ARMBOARD 7.5X6 YLW CONV (MISCELLANEOUS) ×6 IMPLANT
PENCIL BUTTON HOLSTER BLD 10FT (ELECTRODE) ×3 IMPLANT
PLATE SMARTLOCK MANDIBULAR FX (Plate) ×4 IMPLANT
SCISSORS WIRE ANG 4 3/4 DISP (INSTRUMENTS) ×3 IMPLANT
SCREW LOCK SELFDRIL 2.0X8M MMF (Screw) ×2 IMPLANT
SCREW LOCKING SELF DRILL 2.0X6 (Screw) ×18 IMPLANT
STAPLER VISISTAT 35W (STAPLE) ×2 IMPLANT
SUT BONE WAX W31G (SUTURE) ×2 IMPLANT
SUT ETHILON 4 0 CL P 3 (SUTURE) IMPLANT
SUT MON AB 3-0 SH 27 (SUTURE)
SUT MON AB 3-0 SH27 (SUTURE) ×2 IMPLANT
SUT PROLENE 6 0 PC 1 (SUTURE) IMPLANT
SUT STEEL 0 (SUTURE) ×3
SUT STEEL 0 18XMFL TIE 17 (SUTURE) IMPLANT
SUT STEEL 1 (SUTURE) IMPLANT
SUT STEEL 2 (SUTURE) IMPLANT
SUT STEEL 4 (SUTURE) IMPLANT
SUT VIC AB 3-0 SH 27 (SUTURE)
SUT VIC AB 3-0 SH 27XBRD (SUTURE) IMPLANT
SUT VICRYL 4-0 PS2 18IN ABS (SUTURE) IMPLANT
TOWEL OR 17X24 6PK STRL BLUE (TOWEL DISPOSABLE) ×3 IMPLANT
TOWEL OR 17X26 10 PK STRL BLUE (TOWEL DISPOSABLE) ×1 IMPLANT
TRAY ENT MC OR (CUSTOM PROCEDURE TRAY) ×3 IMPLANT
TRAY FOLEY MTR SLVR 14FR STAT (SET/KITS/TRAYS/PACK) IMPLANT
TRAY MODULE STERIL MMF REUSE (Plate) IMPLANT
WATER STERILE IRR 1000ML POUR (IV SOLUTION) ×3 IMPLANT

## 2018-03-13 NOTE — Anesthesia Procedure Notes (Addendum)
Procedure Name: Intubation Date/Time: 03/13/2018 7:25 AM Performed by: Raenette Rover, CRNA Pre-anesthesia Checklist: Patient identified, Emergency Drugs available, Suction available and Patient being monitored Patient Re-evaluated:Patient Re-evaluated prior to induction Oxygen Delivery Method: Circle system utilized Preoxygenation: Pre-oxygenation with 100% oxygen Induction Type: IV induction Ventilation: Mask ventilation without difficulty Laryngoscope Size: Mac and 4 Grade View: Grade I Nasal Tubes: Right, Magill forceps- large, utilized, Nasal prep performed and Nasal Rae Tube size: 8.0 mm Number of attempts: 1 Placement Confirmation: ETT inserted through vocal cords under direct vision,  positive ETCO2,  CO2 detector and breath sounds checked- equal and bilateral Secured at: 31 cm Tube secured with: sutured by surgeon. Dental Injury: Teeth and Oropharynx as per pre-operative assessment

## 2018-03-13 NOTE — Op Note (Signed)
First Assist Op Note: [facility] OR I assisted the Surgeon(s) __Dr. Irean Hong Thimmappa__ on the procedure(s): _Intermaxillary fixation___on Date _1/24/2020.   I provided my assistance on this case as follows:   I was present and acted as first Geophysicist/field seismologist during this operation. I was present during the patient transport into the operative suite and assisted the OR staff with transferring and positioning of the patient. All extremities were checked and properly cushioned and safety straps in place. I was involved in the prepping and placement of sterile drapes. A time out was performed and all information confirmed to be correct.  I first assisted during the case including retraction for exposure, assisting with closure of surgical wounds and application of sterile dressings. I provided assistance with application of post operative garments/splinting and assisted with patient transfer back to the stretcher as needed.  Lawerance Bach PA-C Plastic Surgery [phone]

## 2018-03-13 NOTE — Op Note (Signed)
Operative Note   DATE OF OPERATION: 1.24.20  LOCATION: [facility]-outpatient  SURGICAL DIVISION: Plastic Surgery  PREOPERATIVE DIAGNOSES:  1. Open right para symphseal fracture 2. Closed left ramus fracture  POSTOPERATIVE DIAGNOSES:  same  PROCEDURE:  Intermaxillary fixation  SURGEON: Glenna FellowsBrinda Roshana Shuffield MD MBA  ASSISTANT: Miguel DibbleM Fleming PA-C  ANESTHESIA:  General.   EBL: 10 ml  COMPLICATIONS: None immediate.   INDICATIONS FOR PROCEDURE:  The patient, Chad Tanner, is a 28 y.o. male born on 09/05/1990, is here for treatment mandible fracture.   FINDINGS: Multiple teeth with decay. Able to be brought into occlusion with no cross bites, no open bites.  DESCRIPTION OF PROCEDURE:  The patient was taken to the operating room. SCDs were placed and IV antibiotics were given. The patient's operative site was prepped and draped in usual fashion. A time out was performed and all information was confirmed to be correct. Local anesthetic infiltrated to perform bilateral mental and infraorbital nerve blocks. Interdental wire placed around right mandibular canine and premolar. Stryker Hybrid intermaxillary system utilized. Arch bars applied to mandible and maxilla with 6 mm and 8 mm screws, total 5 screws placed in mandible and 4 screws over maxilla. Patient brought into occlusion with 24 Ga wires.Stomach contents suctioned.  The patient was allowed to wake from anesthesia, extubated and taken to the recovery room in satisfactory condition.   SPECIMENS: none  DRAINS: none  Glenna FellowsBrinda Luian Schumpert, MD [facility] Plastic & Reconstructive Surgery [phone], pin [phone]

## 2018-03-13 NOTE — Transfer of Care (Signed)
Immediate Anesthesia Transfer of Care Note  Patient: Chad Tanner  Procedure(s) Performed: Bridgett Larsson FIXATION (N/A Mouth)  Patient Location: PACU  Anesthesia Type:General  Level of Consciousness: awake, alert , oriented, drowsy and patient cooperative  Airway & Oxygen Therapy: Patient Spontanous Breathing  Post-op Assessment: Report given to RN and Post -op Vital signs reviewed and stable  Post vital signs: Reviewed and stable  Last Vitals:  Vitals Value Taken Time  BP 152/105 03/13/2018  8:49 AM  Temp    Pulse 107 03/13/2018  8:51 AM  Resp 19 03/13/2018  8:51 AM  SpO2 100 % 03/13/2018  8:51 AM  Vitals shown include unvalidated device data.  Last Pain:  Vitals:   03/13/18 0850  TempSrc:   PainSc: 8       Patients Stated Pain Goal: 5 (03/13/18 0603)  Complications: No apparent anesthesia complications

## 2018-03-13 NOTE — Anesthesia Preprocedure Evaluation (Signed)
Anesthesia Evaluation  Patient identified by MRN, date of birth, ID band Patient awake    Reviewed: Allergy & Precautions, NPO status , Patient's Chart, lab work & pertinent test results  Airway Mallampati: II  TM Distance: >3 FB Neck ROM: Full    Dental no notable dental hx.    Pulmonary neg pulmonary ROS, Current Smoker,    Pulmonary exam normal breath sounds clear to auscultation       Cardiovascular negative cardio ROS Normal cardiovascular exam Rhythm:Regular Rate:Normal     Neuro/Psych Depression negative neurological ROS  negative psych ROS   GI/Hepatic negative GI ROS, Neg liver ROS,   Endo/Other  negative endocrine ROS  Renal/GU negative Renal ROS  negative genitourinary   Musculoskeletal negative musculoskeletal ROS (+)   Abdominal   Peds negative pediatric ROS (+)  Hematology negative hematology ROS (+)   Anesthesia Other Findings   Reproductive/Obstetrics negative OB ROS                             Anesthesia Physical Anesthesia Plan  ASA: II  Anesthesia Plan: General   Post-op Pain Management:    Induction: Intravenous  PONV Risk Score and Plan: 1 and Ondansetron  Airway Management Planned: Nasal ETT  Additional Equipment:   Intra-op Plan:   Post-operative Plan: Extubation in OR  Informed Consent: I have reviewed the patients History and Physical, chart, labs and discussed the procedure including the risks, benefits and alternatives for the proposed anesthesia with the patient or authorized representative who has indicated his/her understanding and acceptance.     Dental advisory given  Plan Discussed with: CRNA  Anesthesia Plan Comments:         Anesthesia Quick Evaluation

## 2018-03-13 NOTE — Progress Notes (Signed)
Wasted 0.5 dilaudid in stericycle with witness Futures trader PACU

## 2018-03-13 NOTE — Interval H&P Note (Signed)
History and Physical Interval Note:  03/13/2018 6:50 AM  Chad Tanner  has presented today for surgery, with the diagnosis of open right parasymphsyeal fracture closed left mandible ramus fracture  The various methods of treatment have been discussed with the patient and family. After consideration of risks, benefits and other options for treatment, the patient has consented to  Procedure(s): INTERMAXILLARY FIXATION (N/A) as a surgical intervention .  The patient's history has been reviewed, patient examined, no change in status, stable for surgery.  I have reviewed the patient's chart and labs.  Questions were answered to the patient's satisfaction.     Irean Hong Charleene Callegari

## 2018-03-13 NOTE — Anesthesia Postprocedure Evaluation (Signed)
Anesthesia Post Note  Patient: Chad Tanner  Procedure(s) Performed: Bridgett Larsson FIXATION (N/A Mouth)     Patient location during evaluation: PACU Anesthesia Type: General Level of consciousness: awake and alert Pain management: pain level controlled Vital Signs Assessment: post-procedure vital signs reviewed and stable Respiratory status: spontaneous breathing, nonlabored ventilation and respiratory function stable Cardiovascular status: blood pressure returned to baseline and stable Postop Assessment: no apparent nausea or vomiting Anesthetic complications: no    Last Vitals:  Vitals:   03/13/18 1019 03/13/18 1030  BP: (!) 160/112 (!) 145/99  Pulse:  60  Resp:    Temp:    SpO2:  98%    Last Pain:  Vitals:   03/13/18 1002  TempSrc:   PainSc: Asleep                 Lowella Curb

## 2018-03-16 ENCOUNTER — Encounter (HOSPITAL_COMMUNITY): Payer: Self-pay | Admitting: Plastic Surgery
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
## 2018-04-10 ENCOUNTER — Encounter (HOSPITAL_BASED_OUTPATIENT_CLINIC_OR_DEPARTMENT_OTHER): Payer: Self-pay | Admitting: *Deleted

## 2018-04-10 ENCOUNTER — Other Ambulatory Visit: Payer: Self-pay

## 2018-04-17 ENCOUNTER — Encounter (HOSPITAL_BASED_OUTPATIENT_CLINIC_OR_DEPARTMENT_OTHER)
Admission: RE | Disposition: A | Discharge status: Home / Self Care | Payer: Self-pay | Source: Home / Self Care | Attending: Plastic Surgery

## 2018-04-17 ENCOUNTER — Encounter (HOSPITAL_BASED_OUTPATIENT_CLINIC_OR_DEPARTMENT_OTHER): Payer: Self-pay | Admitting: *Deleted

## 2018-04-17 ENCOUNTER — Ambulatory Visit (HOSPITAL_BASED_OUTPATIENT_CLINIC_OR_DEPARTMENT_OTHER): Payer: Self-pay | Admitting: Anesthesiology

## 2018-04-17 ENCOUNTER — Ambulatory Visit (HOSPITAL_BASED_OUTPATIENT_CLINIC_OR_DEPARTMENT_OTHER)
Admission: RE | Admit: 2018-04-17 | Discharge: 2018-04-17 | Disposition: A | Discharge status: Home / Self Care | Payer: Self-pay | Attending: Plastic Surgery | Admitting: Plastic Surgery

## 2018-04-17 ENCOUNTER — Other Ambulatory Visit: Payer: Self-pay

## 2018-04-17 DIAGNOSIS — F17200 Nicotine dependence, unspecified, uncomplicated: Secondary | ICD-10-CM | POA: Insufficient documentation

## 2018-04-17 DIAGNOSIS — Z472 Encounter for removal of internal fixation device: Secondary | ICD-10-CM | POA: Insufficient documentation

## 2018-04-17 DIAGNOSIS — S02609B Fracture of mandible, unspecified, initial encounter for open fracture: Secondary | ICD-10-CM | POA: Insufficient documentation

## 2018-04-17 HISTORY — PX: MANDIBULAR HARDWARE REMOVAL: SHX5205

## 2018-04-17 SURGERY — REMOVAL, HARDWARE, MANDIBLE
Anesthesia: Monitor Anesthesia Care | Site: Mouth

## 2018-04-17 MED ORDER — OXYCODONE HCL 5 MG/5ML PO SOLN: 5.0000 mg | Freq: Once | ORAL | Status: DC | PRN

## 2018-04-17 MED ORDER — PROPOFOL 10 MG/ML IV BOLUS
INTRAVENOUS | Status: DC | PRN
  Administered 2018-04-17: 200 mg via INTRAVENOUS

## 2018-04-17 MED ORDER — DEXAMETHASONE SODIUM PHOSPHATE 4 MG/ML IJ SOLN
INTRAMUSCULAR | Status: DC | PRN
  Administered 2018-04-17: 4 mg via INTRAVENOUS

## 2018-04-17 MED ORDER — FENTANYL CITRATE (PF) 100 MCG/2ML IJ SOLN: 50.0000 ug | INTRAMUSCULAR | Status: DC | PRN

## 2018-04-17 MED ORDER — MIDAZOLAM HCL 2 MG/2ML IJ SOLN: 1.0000 mg | INTRAMUSCULAR | Status: DC | PRN

## 2018-04-17 MED ORDER — SCOPOLAMINE 1 MG/3DAYS TD PT72: 1.0000 | MEDICATED_PATCH | Freq: Once | TRANSDERMAL | Status: DC | PRN

## 2018-04-17 MED ORDER — ONDANSETRON HCL 4 MG/2ML IJ SOLN
INTRAMUSCULAR | Status: DC | PRN
  Administered 2018-04-17: 4 mg via INTRAVENOUS

## 2018-04-17 MED ORDER — MIDAZOLAM HCL 2 MG/2ML IJ SOLN
INTRAMUSCULAR | Status: AC
  Filled 2018-04-17: qty 2

## 2018-04-17 MED ORDER — OXYCODONE HCL 5 MG PO TABS: 5.0000 mg | ORAL_TABLET | Freq: Four times a day (QID) | ORAL | 0 refills | Status: AC | PRN

## 2018-04-17 MED ORDER — LACTATED RINGERS IV SOLN
INTRAVENOUS | Status: DC
  Administered 2018-04-17: 10:00:00 via INTRAVENOUS

## 2018-04-17 MED ORDER — LIDOCAINE HCL (CARDIAC) PF 100 MG/5ML IV SOSY
PREFILLED_SYRINGE | INTRAVENOUS | Status: DC | PRN
  Administered 2018-04-17: 60 mg via INTRAVENOUS

## 2018-04-17 MED ORDER — FENTANYL CITRATE (PF) 100 MCG/2ML IJ SOLN: 25.0000 ug | INTRAMUSCULAR | Status: DC | PRN

## 2018-04-17 MED ORDER — ONDANSETRON HCL 4 MG/2ML IJ SOLN: 4.0000 mg | Freq: Once | INTRAMUSCULAR | Status: DC | PRN

## 2018-04-17 MED ORDER — LIDOCAINE-EPINEPHRINE 1 %-1:100000 IJ SOLN
INTRAMUSCULAR | Status: DC | PRN
  Administered 2018-04-17: 4 mL

## 2018-04-17 MED ORDER — OXYCODONE HCL 5 MG PO TABS: 5.0000 mg | ORAL_TABLET | Freq: Once | ORAL | Status: DC | PRN

## 2018-04-17 SURGICAL SUPPLY — 36 items
BLADE SURG 15 STRL LF DISP TIS (BLADE) ×1 IMPLANT
BLADE SURG 15 STRL SS (BLADE) ×3
CANISTER SUCT 1200ML W/VALVE (MISCELLANEOUS) ×3 IMPLANT
COVER MAYO STAND STRL (DRAPES) ×2 IMPLANT
COVER WAND RF STERILE (DRAPES) IMPLANT
DECANTER SPIKE VIAL GLASS SM (MISCELLANEOUS) ×1 IMPLANT
ELECT COATED BLADE 2.86 ST (ELECTRODE) ×2 IMPLANT
ELECT REM PT RETURN 9FT ADLT (ELECTROSURGICAL) ×3
ELECTRODE REM PT RTRN 9FT ADLT (ELECTROSURGICAL) IMPLANT
GLOVE BIO SURGEON STRL SZ 6 (GLOVE) ×3 IMPLANT
GLOVE BIO SURGEON STRL SZ 6.5 (GLOVE) ×1 IMPLANT
GLOVE BIO SURGEONS STRL SZ 6.5 (GLOVE) ×1
GLOVE BIOGEL PI IND STRL 6.5 (GLOVE) IMPLANT
GLOVE BIOGEL PI IND STRL 8 (GLOVE) IMPLANT
GLOVE BIOGEL PI INDICATOR 6.5 (GLOVE) ×2
GLOVE BIOGEL PI INDICATOR 8 (GLOVE) ×2
GLOVE SURG SYN 8.0 (GLOVE) ×3 IMPLANT
GLOVE SURG SYN 8.0 PF PI (GLOVE) IMPLANT
GOWN STRL REIN XL XLG (GOWN DISPOSABLE) ×2 IMPLANT
GOWN STRL REUS W/ TWL LRG LVL3 (GOWN DISPOSABLE) ×1 IMPLANT
GOWN STRL REUS W/TWL LRG LVL3 (GOWN DISPOSABLE) ×6
MARKER SKIN DUAL TIP RULER LAB (MISCELLANEOUS) IMPLANT
NDL PRECISIONGLIDE 27X1.5 (NEEDLE) ×1 IMPLANT
NEEDLE PRECISIONGLIDE 27X1.5 (NEEDLE) ×3 IMPLANT
NS IRRIG 1000ML POUR BTL (IV SOLUTION) ×2 IMPLANT
PACK BASIN DAY SURGERY FS (CUSTOM PROCEDURE TRAY) ×3 IMPLANT
PENCIL BUTTON HOLSTER BLD 10FT (ELECTRODE) IMPLANT
SHEET MEDIUM DRAPE 40X70 STRL (DRAPES) ×3 IMPLANT
SUT CHROMIC 3 0 PS 2 (SUTURE) IMPLANT
SUT CHROMIC 4 0 PS 2 18 (SUTURE) IMPLANT
SYR CONTROL 10ML LL (SYRINGE) ×3 IMPLANT
TOWEL GREEN STERILE FF (TOWEL DISPOSABLE) ×3 IMPLANT
TRAY DSU PREP LF (CUSTOM PROCEDURE TRAY) IMPLANT
TUBE CONNECTING 20'X1/4 (TUBING) ×1
TUBE CONNECTING 20X1/4 (TUBING) ×2 IMPLANT
YANKAUER SUCT BULB TIP NO VENT (SUCTIONS) ×3 IMPLANT
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
## 2018-04-17 NOTE — Anesthesia Procedure Notes (Signed)
Procedure Name: MAC Date/Time: 04/17/2018 10:22 AM Performed by: Signe Colt, CRNA Pre-anesthesia Checklist: Patient identified, Emergency Drugs available, Suction available, Patient being monitored and Timeout performed Patient Re-evaluated:Patient Re-evaluated prior to induction Oxygen Delivery Method: Simple face mask

## 2018-04-17 NOTE — Anesthesia Postprocedure Evaluation (Signed)
Anesthesia Post Note  Patient: GUSTAF MCCARTER  Procedure(s) Performed: removal IMF (N/A Mouth)     Patient location during evaluation: PACU Anesthesia Type: MAC Level of consciousness: awake and alert Pain management: pain level controlled Vital Signs Assessment: post-procedure vital signs reviewed and stable Respiratory status: spontaneous breathing, nonlabored ventilation, respiratory function stable and patient connected to nasal cannula oxygen Cardiovascular status: stable and blood pressure returned to baseline Postop Assessment: no apparent nausea or vomiting Anesthetic complications: no    Last Vitals:  Vitals:   04/17/18 1045 04/17/18 1100  BP: 101/70   Pulse: (!) 56 70  Resp: 15 16  Temp:    SpO2: 100% 100%    Last Pain:  Vitals:   04/17/18 1100  TempSrc:   PainSc: 0-No pain                 Alayzia Pavlock COKER
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
## 2018-04-17 NOTE — Anesthesia Preprocedure Evaluation (Addendum)
Anesthesia Evaluation  Patient identified by MRN, date of birth, ID band Patient awake    Reviewed: Allergy & Precautions, NPO status , Patient's Chart, lab work & pertinent test results  Airway   TM Distance: >3 FB Neck ROM: Full  Mouth opening: Limited Mouth Opening  Dental   Inter-dental fixation wires in place. Patient unable to open mouth.:   Pulmonary Current Smoker,    breath sounds clear to auscultation       Cardiovascular  Rhythm:Regular Rate:Normal     Neuro/Psych    GI/Hepatic   Endo/Other    Renal/GU      Musculoskeletal   Abdominal   Peds  Hematology   Anesthesia Other Findings   Reproductive/Obstetrics                            Anesthesia Physical Anesthesia Plan  ASA: II  Anesthesia Plan: MAC   Post-op Pain Management:    Induction: Intravenous  PONV Risk Score and Plan: Ondansetron, Dexamethasone and Propofol infusion  Airway Management Planned: Nasal Cannula  Additional Equipment:   Intra-op Plan:   Post-operative Plan:   Informed Consent: I have reviewed the patients History and Physical, chart, labs and discussed the procedure including the risks, benefits and alternatives for the proposed anesthesia with the patient or authorized representative who has indicated his/her understanding and acceptance.       Plan Discussed with: Anesthesiologist and CRNA  Anesthesia Plan Comments:         Anesthesia Quick Evaluation

## 2018-04-17 NOTE — Discharge Instructions (Signed)

## 2018-04-17 NOTE — Transfer of Care (Signed)
Immediate Anesthesia Transfer of Care Note  Patient: Chad Tanner  Procedure(s) Performed: removal IMF (N/A Mouth)  Patient Location: PACU  Anesthesia Type:General  Level of Consciousness: drowsy and patient cooperative  Airway & Oxygen Therapy: Patient Spontanous Breathing and Patient connected to face mask oxygen  Post-op Assessment: Report given to RN and Post -op Vital signs reviewed and stable  Post vital signs: Reviewed and stable  Last Vitals:  Vitals Value Taken Time  BP    Temp    Pulse    Resp    SpO2      Last Pain:  Vitals:   04/17/18 0944  TempSrc: Oral  PainSc: 0-No pain         Complications: No apparent anesthesia complications

## 2018-04-20 ENCOUNTER — Encounter (HOSPITAL_BASED_OUTPATIENT_CLINIC_OR_DEPARTMENT_OTHER): Payer: Self-pay | Admitting: Plastic Surgery

## 2018-05-04 ENCOUNTER — Encounter (HOSPITAL_BASED_OUTPATIENT_CLINIC_OR_DEPARTMENT_OTHER): Payer: Self-pay | Admitting: Plastic Surgery

## 2018-07-19 ENCOUNTER — Emergency Department (HOSPITAL_COMMUNITY)
Admission: EM | Admit: 2018-07-19 | Discharge: 2018-07-19 | Disposition: A | Discharge status: Home / Self Care | Payer: Self-pay | Attending: Emergency Medicine | Admitting: Emergency Medicine

## 2018-07-19 ENCOUNTER — Other Ambulatory Visit: Payer: Self-pay

## 2018-07-19 ENCOUNTER — Emergency Department (HOSPITAL_COMMUNITY): Payer: Self-pay

## 2018-07-19 ENCOUNTER — Encounter (HOSPITAL_COMMUNITY): Payer: Self-pay | Admitting: Emergency Medicine

## 2018-07-19 DIAGNOSIS — S21212A Laceration without foreign body of left back wall of thorax without penetration into thoracic cavity, initial encounter: Secondary | ICD-10-CM | POA: Insufficient documentation

## 2018-07-19 DIAGNOSIS — Y999 Unspecified external cause status: Secondary | ICD-10-CM | POA: Insufficient documentation

## 2018-07-19 DIAGNOSIS — F17210 Nicotine dependence, cigarettes, uncomplicated: Secondary | ICD-10-CM | POA: Insufficient documentation

## 2018-07-19 DIAGNOSIS — Y939 Activity, unspecified: Secondary | ICD-10-CM | POA: Insufficient documentation

## 2018-07-19 DIAGNOSIS — Y92039 Unspecified place in apartment as the place of occurrence of the external cause: Secondary | ICD-10-CM | POA: Insufficient documentation

## 2018-07-19 DIAGNOSIS — T1490XA Injury, unspecified, initial encounter: Secondary | ICD-10-CM

## 2018-07-19 LAB — PREPARE FRESH FROZEN PLASMA
Unit division: 0
Unit division: 0

## 2018-07-19 LAB — BPAM FFP
Blood Product Expiration Date: 202006032359
Blood Product Expiration Date: 202006032359
ISSUE DATE / TIME: 202005310959
ISSUE DATE / TIME: 202005310959
Unit Type and Rh: 6200
Unit Type and Rh: 6200

## 2018-07-19 LAB — TYPE AND SCREEN
ABO/RH(D): A NEG
Antibody Screen: NEGATIVE
Unit division: 0
Unit division: 0

## 2018-07-19 LAB — BPAM RBC
Blood Product Expiration Date: 202006102359
Blood Product Expiration Date: 202006212359
ISSUE DATE / TIME: 202005310959
ISSUE DATE / TIME: 202005310959
Unit Type and Rh: 5100
Unit Type and Rh: 5100

## 2018-07-19 LAB — COMPREHENSIVE METABOLIC PANEL
ALT: 47 U/L — ABNORMAL HIGH (ref 0–44)
AST: 70 U/L — ABNORMAL HIGH (ref 15–41)
Albumin: 4.8 g/dL (ref 3.5–5.0)
Alkaline Phosphatase: 60 U/L (ref 38–126)
Anion gap: 15 (ref 5–15)
BUN: 6 mg/dL (ref 6–20)
CO2: 21 mmol/L — ABNORMAL LOW (ref 22–32)
Calcium: 9.1 mg/dL (ref 8.9–10.3)
Chloride: 108 mmol/L (ref 98–111)
Creatinine, Ser: 1.08 mg/dL (ref 0.61–1.24)
GFR calc Af Amer: >60 mL/min (ref >60)
GFR calc non Af Amer: >60 mL/min (ref >60)
Glucose, Bld: 99 mg/dL (ref 70–99)
Potassium: 4.2 mmol/L (ref 3.5–5.1)
Sodium: 144 mmol/L (ref 135–145)
Total Bilirubin: 0.6 mg/dL (ref 0.3–1.2)
Total Protein: 7.4 g/dL (ref 6.5–8.1)

## 2018-07-19 LAB — I-STAT CHEM 8, ED
BUN: 7 mg/dL (ref 6–20)
Calcium, Ion: 0.96 mmol/L — ABNORMAL LOW (ref 1.15–1.40)
Chloride: 112 mmol/L — ABNORMAL HIGH (ref 98–111)
Creatinine, Ser: 1.5 mg/dL — ABNORMAL HIGH (ref 0.61–1.24)
Glucose, Bld: 94 mg/dL (ref 70–99)
HCT: 48 % (ref 39.0–52.0)
Hemoglobin: 16.3 g/dL (ref 13.0–17.0)
Potassium: 4.1 mmol/L (ref 3.5–5.1)
Sodium: 144 mmol/L (ref 135–145)
TCO2: 22 mmol/L (ref 22–32)

## 2018-07-19 LAB — CBC
HCT: 45.1 % (ref 39.0–52.0)
Hemoglobin: 15.5 g/dL (ref 13.0–17.0)
MCH: 31.5 pg (ref 26.0–34.0)
MCHC: 34.4 g/dL (ref 30.0–36.0)
MCV: 91.7 fL (ref 80.0–100.0)
Platelets: 278 10*3/uL (ref 150–400)
RBC: 4.92 MIL/uL (ref 4.22–5.81)
RDW: 14.7 % (ref 11.5–15.5)
WBC: 5.5 10*3/uL (ref 4.0–10.5)
nRBC: 0 % (ref 0.0–0.2)

## 2018-07-19 LAB — ABO/RH: ABO/RH(D): A NEG

## 2018-07-19 LAB — LACTIC ACID, PLASMA: Lactic Acid, Venous: 2.3 mmol/L (ref 0.5–1.9)

## 2018-07-19 LAB — PROTIME-INR
INR: 1 (ref 0.8–1.2)
Prothrombin Time: 12.9 seconds (ref 11.4–15.2)

## 2018-07-19 LAB — ETHANOL: Alcohol, Ethyl (B): 255 mg/dL — ABNORMAL HIGH (ref <10)

## 2018-07-19 MED ORDER — ONDANSETRON HCL 4 MG/2ML IJ SOLN
INTRAMUSCULAR | Status: AC
  Filled 2018-07-19: qty 2

## 2018-07-19 MED ORDER — NAPROXEN 500 MG PO TABS: 500.0000 mg | ORAL_TABLET | Freq: Two times a day (BID) | ORAL | 0 refills | Status: DC

## 2018-07-19 MED ORDER — FENTANYL CITRATE (PF) 100 MCG/2ML IJ SOLN
25.0000 ug | Freq: Once | INTRAMUSCULAR | Status: AC
  Administered 2018-07-19: 10:00:00 25 ug via INTRAVENOUS

## 2018-07-19 MED ORDER — KETOROLAC TROMETHAMINE 15 MG/ML IJ SOLN
15.0000 mg | Freq: Once | INTRAMUSCULAR | Status: AC
  Administered 2018-07-19: 15 mg via INTRAVENOUS
  Filled 2018-07-19: qty 1

## 2018-07-19 MED ORDER — LIDOCAINE-EPINEPHRINE (PF) 2 %-1:200000 IJ SOLN
10.0000 mL | Freq: Once | INTRAMUSCULAR | Status: AC
  Administered 2018-07-19: 10 mL
  Filled 2018-07-19: qty 20

## 2018-07-19 MED ORDER — TETANUS-DIPHTH-ACELL PERTUSSIS 5-2.5-18.5 LF-MCG/0.5 IM SUSP: 0.5000 mL | Freq: Once | INTRAMUSCULAR | Status: DC

## 2018-07-19 MED ORDER — IOHEXOL 300 MG/ML  SOLN
75.0000 mL | Freq: Once | INTRAMUSCULAR | Status: AC | PRN
  Administered 2018-07-19: 11:00:00 75 mL via INTRAVENOUS

## 2018-07-19 MED ORDER — FENTANYL CITRATE (PF) 100 MCG/2ML IJ SOLN
INTRAMUSCULAR | Status: AC
  Filled 2018-07-19: qty 2

## 2018-07-19 MED ORDER — SODIUM CHLORIDE 0.9 % IV SOLN
INTRAVENOUS | Status: AC | PRN
  Administered 2018-07-19: 150 mL/h via INTRAVENOUS

## 2018-07-19 MED ORDER — ONDANSETRON HCL 4 MG/2ML IJ SOLN
4.0000 mg | Freq: Once | INTRAMUSCULAR | Status: AC
  Administered 2018-07-19: 4 mg via INTRAVENOUS

## 2018-07-19 MED ORDER — CYCLOBENZAPRINE HCL 10 MG PO TABS: 10.0000 mg | ORAL_TABLET | Freq: Two times a day (BID) | ORAL | 0 refills | Status: DC | PRN
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
## 2018-07-20 ENCOUNTER — Encounter (HOSPITAL_BASED_OUTPATIENT_CLINIC_OR_DEPARTMENT_OTHER): Payer: Self-pay | Admitting: Plastic Surgery

## 2019-01-14 ENCOUNTER — Emergency Department (HOSPITAL_COMMUNITY): Admission: EM | Admit: 2019-01-14 | Discharge: 2019-01-14 | Discharge status: Against Medical Advice | Payer: 59
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
## 2019-01-14 NOTE — ED Notes (Signed)
Patient refused labs and refused vital signs taken.

## 2019-03-03 ENCOUNTER — Other Ambulatory Visit: Payer: Self-pay

## 2019-03-03 ENCOUNTER — Encounter (HOSPITAL_COMMUNITY): Payer: Self-pay

## 2019-03-03 ENCOUNTER — Emergency Department (HOSPITAL_COMMUNITY): Payer: Managed Care, Other (non HMO)

## 2019-03-03 ENCOUNTER — Emergency Department (HOSPITAL_COMMUNITY)
Admission: EM | Admit: 2019-03-03 | Discharge: 2019-03-04 | Disposition: A | Discharge status: Home / Self Care | Payer: Managed Care, Other (non HMO) | Attending: Emergency Medicine | Admitting: Emergency Medicine

## 2019-03-03 DIAGNOSIS — F17210 Nicotine dependence, cigarettes, uncomplicated: Secondary | ICD-10-CM | POA: Insufficient documentation

## 2019-03-03 DIAGNOSIS — Y929 Unspecified place or not applicable: Secondary | ICD-10-CM | POA: Diagnosis not present

## 2019-03-03 DIAGNOSIS — R109 Unspecified abdominal pain: Secondary | ICD-10-CM

## 2019-03-03 DIAGNOSIS — Y939 Activity, unspecified: Secondary | ICD-10-CM | POA: Diagnosis not present

## 2019-03-03 DIAGNOSIS — Y999 Unspecified external cause status: Secondary | ICD-10-CM | POA: Diagnosis not present

## 2019-03-03 DIAGNOSIS — R1012 Left upper quadrant pain: Secondary | ICD-10-CM | POA: Diagnosis present
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
## 2019-08-21 ENCOUNTER — Emergency Department (HOSPITAL_COMMUNITY): Payer: Self-pay

## 2019-08-21 ENCOUNTER — Encounter (HOSPITAL_COMMUNITY): Payer: Self-pay

## 2019-08-21 ENCOUNTER — Emergency Department (HOSPITAL_COMMUNITY)
Admission: EM | Admit: 2019-08-21 | Discharge: 2019-08-22 | Disposition: A | Discharge status: Home / Self Care | Payer: Self-pay | Attending: Emergency Medicine | Admitting: Emergency Medicine

## 2019-08-21 DIAGNOSIS — Y999 Unspecified external cause status: Secondary | ICD-10-CM | POA: Insufficient documentation

## 2019-08-21 DIAGNOSIS — S01412A Laceration without foreign body of left cheek and temporomandibular area, initial encounter: Secondary | ICD-10-CM | POA: Insufficient documentation

## 2019-08-21 DIAGNOSIS — M542 Cervicalgia: Secondary | ICD-10-CM | POA: Insufficient documentation

## 2019-08-21 DIAGNOSIS — S0083XA Contusion of other part of head, initial encounter: Secondary | ICD-10-CM

## 2019-08-21 DIAGNOSIS — M25561 Pain in right knee: Secondary | ICD-10-CM | POA: Insufficient documentation

## 2019-08-21 DIAGNOSIS — S01312A Laceration without foreign body of left ear, initial encounter: Secondary | ICD-10-CM

## 2019-08-21 DIAGNOSIS — F17210 Nicotine dependence, cigarettes, uncomplicated: Secondary | ICD-10-CM | POA: Insufficient documentation

## 2019-08-21 DIAGNOSIS — R451 Restlessness and agitation: Secondary | ICD-10-CM | POA: Insufficient documentation

## 2019-08-21 DIAGNOSIS — S0990XA Unspecified injury of head, initial encounter: Secondary | ICD-10-CM

## 2019-08-21 DIAGNOSIS — S0181XA Laceration without foreign body of other part of head, initial encounter: Secondary | ICD-10-CM

## 2019-08-21 DIAGNOSIS — S0003XA Contusion of scalp, initial encounter: Secondary | ICD-10-CM | POA: Insufficient documentation

## 2019-08-21 DIAGNOSIS — Y929 Unspecified place or not applicable: Secondary | ICD-10-CM | POA: Insufficient documentation

## 2019-08-21 DIAGNOSIS — Y939 Activity, unspecified: Secondary | ICD-10-CM | POA: Insufficient documentation

## 2019-08-21 DIAGNOSIS — Z79899 Other long term (current) drug therapy: Secondary | ICD-10-CM | POA: Insufficient documentation

## 2019-08-21 MED ORDER — LIDOCAINE-EPINEPHRINE (PF) 2 %-1:200000 IJ SOLN
20.0000 mL | Freq: Once | INTRAMUSCULAR | Status: AC
  Administered 2019-08-21: 20 mL
  Filled 2019-08-21: qty 20

## 2019-08-21 MED ORDER — ACETAMINOPHEN 500 MG PO TABS
1000.0000 mg | ORAL_TABLET | Freq: Once | ORAL | Status: AC
  Administered 2019-08-21: 1000 mg via ORAL
  Filled 2019-08-21: qty 2

## 2019-08-21 MED ORDER — TETANUS-DIPHTH-ACELL PERTUSSIS 5-2.5-18.5 LF-MCG/0.5 IM SUSP
0.5000 mL | Freq: Once | INTRAMUSCULAR | Status: DC
  Filled 2019-08-21: qty 0.5

## 2019-08-21 NOTE — ED Notes (Signed)
[place] mother [phone] would like an update on the pt

## 2019-08-21 NOTE — ED Notes (Signed)
Patient transported to CT 

## 2019-08-21 NOTE — ED Notes (Signed)
RN tried to give pt ice pack, pt refused
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
## 2019-08-21 NOTE — ED Notes (Signed)
Suture cart at bedside 
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
## 2019-08-22 NOTE — ED Notes (Signed)
Pt refused discharge vitals 

## 2019-08-22 NOTE — Discharge Instructions (Signed)
Keep wounds clean, you can shower but no bathtub/hot tubs/pools until healed Return for signs of infection such as fevers, spreading redness or pus draining. Use Tylenol, ice and Motrin as needed for pain. Your sutures are absorbable so you do not have to have them removed.

## 2019-08-22 NOTE — ED Notes (Addendum)
pt given apple juice
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
## 2022-11-15 ENCOUNTER — Other Ambulatory Visit (HOSPITAL_COMMUNITY)
Admission: EM | Admit: 2022-11-15 | Discharge: 2022-11-19 | Disposition: A | Discharge status: Home / Self Care | Payer: BLUE CROSS/BLUE SHIELD | Attending: Psychiatry | Admitting: Psychiatry

## 2022-11-15 ENCOUNTER — Ambulatory Visit (HOSPITAL_COMMUNITY)
Admission: EM | Admit: 2022-11-15 | Discharge: 2022-11-15 | Disposition: A | Discharge status: Other Acute Inpatient Hospital | Payer: BLUE CROSS/BLUE SHIELD | Attending: Psychiatry | Admitting: Psychiatry

## 2022-11-15 DIAGNOSIS — F1010 Alcohol abuse, uncomplicated: Secondary | ICD-10-CM | POA: Diagnosis present

## 2022-11-15 DIAGNOSIS — R4584 Anhedonia: Secondary | ICD-10-CM | POA: Insufficient documentation

## 2022-11-15 DIAGNOSIS — Z653 Problems related to other legal circumstances: Secondary | ICD-10-CM | POA: Insufficient documentation

## 2022-11-15 DIAGNOSIS — F10130 Alcohol abuse with withdrawal, uncomplicated: Secondary | ICD-10-CM | POA: Insufficient documentation

## 2022-11-15 DIAGNOSIS — F1090 Alcohol use, unspecified, uncomplicated: Secondary | ICD-10-CM | POA: Diagnosis not present

## 2022-11-15 DIAGNOSIS — F32A Depression, unspecified: Secondary | ICD-10-CM | POA: Insufficient documentation

## 2022-11-15 DIAGNOSIS — I4581 Long QT syndrome: Secondary | ICD-10-CM | POA: Insufficient documentation

## 2022-11-15 DIAGNOSIS — R9431 Abnormal electrocardiogram [ECG] [EKG]: Secondary | ICD-10-CM | POA: Insufficient documentation

## 2022-11-15 DIAGNOSIS — Z560 Unemployment, unspecified: Secondary | ICD-10-CM | POA: Insufficient documentation

## 2022-11-15 DIAGNOSIS — R630 Anorexia: Secondary | ICD-10-CM | POA: Insufficient documentation

## 2022-11-15 MED ORDER — ACETAMINOPHEN 325 MG PO TABS: 650.0000 mg | ORAL_TABLET | Freq: Four times a day (QID) | ORAL | Status: DC | PRN

## 2022-11-15 MED ORDER — THIAMINE MONONITRATE 100 MG PO TABS: 100.0000 mg | ORAL_TABLET | Freq: Every day | ORAL | Status: DC

## 2022-11-15 MED ORDER — ADULT MULTIVITAMIN W/MINERALS CH
1.0000 | ORAL_TABLET | Freq: Every day | ORAL | Status: DC
  Administered 2022-11-16 – 2022-11-19 (×4): 1 via ORAL
  Filled 2022-11-15 (×4): qty 1

## 2022-11-15 MED ORDER — MAGNESIUM HYDROXIDE 400 MG/5ML PO SUSP: 30.0000 mL | Freq: Every day | ORAL | Status: DC | PRN

## 2022-11-15 MED ORDER — ACETAMINOPHEN 325 MG PO TABS
650.0000 mg | ORAL_TABLET | Freq: Four times a day (QID) | ORAL | Status: DC | PRN
  Administered 2022-11-18: 650 mg via ORAL
  Filled 2022-11-15: qty 2

## 2022-11-15 MED ORDER — CLONIDINE HCL 0.1 MG PO TABS: 0.1000 mg | ORAL_TABLET | Freq: Once | ORAL | Status: DC

## 2022-11-15 MED ORDER — TRAZODONE HCL 50 MG PO TABS: 50.0000 mg | ORAL_TABLET | Freq: Every evening | ORAL | Status: DC | PRN

## 2022-11-15 MED ORDER — LORAZEPAM 1 MG PO TABS: 1.0000 mg | ORAL_TABLET | Freq: Four times a day (QID) | ORAL | Status: DC | PRN

## 2022-11-15 MED ORDER — HYDROXYZINE HCL 25 MG PO TABS: 25.0000 mg | ORAL_TABLET | Freq: Four times a day (QID) | ORAL | Status: DC | PRN

## 2022-11-15 MED ORDER — LORAZEPAM 1 MG PO TABS: 1.0000 mg | ORAL_TABLET | Freq: Every day | ORAL | Status: DC

## 2022-11-15 MED ORDER — ALUM & MAG HYDROXIDE-SIMETH 200-200-20 MG/5ML PO SUSP: 30.0000 mL | ORAL | Status: DC | PRN

## 2022-11-15 MED ORDER — ONDANSETRON 4 MG PO TBDP: 4.0000 mg | ORAL_TABLET | Freq: Four times a day (QID) | ORAL | Status: DC | PRN

## 2022-11-15 MED ORDER — LORAZEPAM 1 MG PO TABS
1.0000 mg | ORAL_TABLET | Freq: Two times a day (BID) | ORAL | Status: AC
  Administered 2022-11-18 – 2022-11-19 (×2): 1 mg via ORAL
  Filled 2022-11-15 (×2): qty 1

## 2022-11-15 MED ORDER — LORAZEPAM 1 MG PO TABS
1.0000 mg | ORAL_TABLET | Freq: Three times a day (TID) | ORAL | Status: AC
  Administered 2022-11-17 – 2022-11-18 (×3): 1 mg via ORAL
  Filled 2022-11-15 (×3): qty 1

## 2022-11-15 MED ORDER — ADULT MULTIVITAMIN W/MINERALS CH: 1.0000 | ORAL_TABLET | Freq: Every day | ORAL | Status: DC

## 2022-11-15 MED ORDER — THIAMINE HCL 100 MG/ML IJ SOLN: 100.0000 mg | Freq: Once | INTRAMUSCULAR | Status: DC

## 2022-11-15 MED ORDER — THIAMINE MONONITRATE 100 MG PO TABS
100.0000 mg | ORAL_TABLET | Freq: Every day | ORAL | Status: DC
  Administered 2022-11-16 – 2022-11-19 (×4): 100 mg via ORAL
  Filled 2022-11-15 (×4): qty 1

## 2022-11-15 MED ORDER — LORAZEPAM 1 MG PO TABS
1.0000 mg | ORAL_TABLET | Freq: Four times a day (QID) | ORAL | Status: AC
  Administered 2022-11-16 – 2022-11-17 (×5): 1 mg via ORAL
  Filled 2022-11-15 (×6): qty 1

## 2022-11-15 MED ORDER — HYDROXYZINE HCL 25 MG PO TABS: 25.0000 mg | ORAL_TABLET | Freq: Four times a day (QID) | ORAL | Status: AC | PRN

## 2022-11-15 MED ORDER — LOPERAMIDE HCL 2 MG PO CAPS: 2.0000 mg | ORAL_CAPSULE | ORAL | Status: AC | PRN

## 2022-11-15 MED ORDER — CLONIDINE HCL 0.1 MG PO TABS
0.1000 mg | ORAL_TABLET | Freq: Once | ORAL | Status: AC
  Administered 2022-11-16: 0.1 mg via ORAL
  Filled 2022-11-15: qty 1

## 2022-11-15 MED ORDER — LORAZEPAM 1 MG PO TABS: 1.0000 mg | ORAL_TABLET | Freq: Four times a day (QID) | ORAL | Status: AC | PRN

## 2022-11-15 MED ORDER — LOPERAMIDE HCL 2 MG PO CAPS: 2.0000 mg | ORAL_CAPSULE | ORAL | Status: DC | PRN

## 2022-11-15 NOTE — ED Notes (Signed)
Pt is A & O x 4. Pt is oriented to the unit and provided with meal. Admission process is completed. Pt denies SI/HI/AVH. Will continue to monitor for safety and provide support.
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
## 2022-11-15 NOTE — ED Notes (Signed)
Patient is sleeping. Respirations equal and unlabored, skin warm and dry. No change in assessment or acuity. Routine safety checks conducted according to facility protocol. Will continue to monitor for safety.   

## 2022-11-15 NOTE — Progress Notes (Signed)
   11/15/22 1553  [facility] Triage Screening (Walk-ins at [facility] only)  How Did You Hear About [place]? Family/Friend  What Is the Reason for Your Visit/Call Today? Chad Tanner is a 32 year old male that presents to [facility] voluntarily accompanied by his girlfriend. Pt reports he is here to find a detox program for his alcohol problem. Pt reports that he drank a pint of alcohol two days ago. Pt mentions that he has a hx of alcohol use since he was 32 years old. Pt reports he has a habit of drinking half a gallon of liquor and drinks four 24oz beers a day in addition to the liquor. Pt is looking to be transfered to [facility] to help with his alcohol problem. Pt denies substance use at this time. Pt also denies SI, HI and AVH.  How Long Has This Been Causing You Problems? > than 6 months  Have You Recently Had Any Thoughts About Hurting Yourself? No  Are You Planning to Commit Suicide/Harm Yourself At This time? No  Have you Recently Had Thoughts About Hurting Someone [name]? No  Are You Planning To Harm Someone At This Time? No  Are you currently experiencing any auditory, visual or other hallucinations? No  Have You Used Any Alcohol or Drugs in the Past 24 Hours? No  Do you have any current medical co-morbidities that require immediate attention? No  Clinician description of patient physical appearance/behavior: calm, cooperative  What Do You Feel Would Help You the Most Today? Alcohol or Drug Use Treatment  If access to [facility] was not available, would you have sought care in the Emergency Department? No  Determination of Need Urgent (48 hours)  Options For Referral Intensive Outpatient Therapy;Facility-Based Crisis
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
## 2022-11-16 ENCOUNTER — Encounter (HOSPITAL_COMMUNITY): Payer: Self-pay | Admitting: Psychiatry

## 2022-11-16 DIAGNOSIS — F10130 Alcohol abuse with withdrawal, uncomplicated: Secondary | ICD-10-CM | POA: Diagnosis not present

## 2022-11-16 DIAGNOSIS — F32A Depression, unspecified: Secondary | ICD-10-CM | POA: Diagnosis not present

## 2022-11-16 DIAGNOSIS — F1010 Alcohol abuse, uncomplicated: Secondary | ICD-10-CM | POA: Diagnosis present

## 2022-11-16 DIAGNOSIS — R9431 Abnormal electrocardiogram [ECG] [EKG]: Secondary | ICD-10-CM | POA: Diagnosis not present

## 2022-11-16 DIAGNOSIS — R4584 Anhedonia: Secondary | ICD-10-CM | POA: Diagnosis not present

## 2022-11-16 DIAGNOSIS — I4581 Long QT syndrome: Secondary | ICD-10-CM | POA: Diagnosis not present

## 2022-11-16 DIAGNOSIS — F1090 Alcohol use, unspecified, uncomplicated: Secondary | ICD-10-CM

## 2022-11-16 DIAGNOSIS — Z560 Unemployment, unspecified: Secondary | ICD-10-CM | POA: Diagnosis not present

## 2022-11-16 DIAGNOSIS — R630 Anorexia: Secondary | ICD-10-CM | POA: Diagnosis not present

## 2022-11-16 DIAGNOSIS — Z653 Problems related to other legal circumstances: Secondary | ICD-10-CM | POA: Diagnosis not present

## 2022-11-16 LAB — TSH: TSH: 1.599 u[IU]/mL (ref 0.350–4.500)

## 2022-11-16 LAB — COMPREHENSIVE METABOLIC PANEL
ALT: 16 U/L (ref 0–44)
AST: 32 U/L (ref 15–41)
Albumin: 3.7 g/dL (ref 3.5–5.0)
Alkaline Phosphatase: 51 U/L (ref 38–126)
Anion gap: 10 (ref 5–15)
BUN: 10 mg/dL (ref 6–20)
CO2: 27 mmol/L (ref 22–32)
Calcium: 9.1 mg/dL (ref 8.9–10.3)
Chloride: 100 mmol/L (ref 98–111)
Creatinine, Ser: 1.13 mg/dL (ref 0.61–1.24)
GFR, Estimated: >60 mL/min (ref >60)
Glucose, Bld: 97 mg/dL (ref 70–99)
Potassium: 4.1 mmol/L (ref 3.5–5.1)
Sodium: 137 mmol/L (ref 135–145)
Total Bilirubin: 1.3 mg/dL — ABNORMAL HIGH (ref 0.3–1.2)
Total Protein: 6.8 g/dL (ref 6.5–8.1)

## 2022-11-16 LAB — CBC WITH DIFFERENTIAL/PLATELET
Abs Immature Granulocytes: 0.02 10*3/uL (ref 0.00–0.07)
Basophils Absolute: 0 10*3/uL (ref 0.0–0.1)
Basophils Relative: 1 %
Eosinophils Absolute: 0 10*3/uL (ref 0.0–0.5)
Eosinophils Relative: 1 %
HCT: 43.3 % (ref 39.0–52.0)
Hemoglobin: 15.1 g/dL (ref 13.0–17.0)
Immature Granulocytes: 0 %
Lymphocytes Relative: 33 %
Lymphs Abs: 1.7 10*3/uL (ref 0.7–4.0)
MCH: 30.6 pg (ref 26.0–34.0)
MCHC: 34.9 g/dL (ref 30.0–36.0)
MCV: 87.8 fL (ref 80.0–100.0)
Monocytes Absolute: 0.4 10*3/uL (ref 0.1–1.0)
Monocytes Relative: 8 %
Neutro Abs: 2.9 10*3/uL (ref 1.7–7.7)
Neutrophils Relative %: 57 %
Platelets: 278 10*3/uL (ref 150–400)
RBC: 4.93 MIL/uL (ref 4.22–5.81)
RDW: 16.7 % — ABNORMAL HIGH (ref 11.5–15.5)
WBC: 5.1 10*3/uL (ref 4.0–10.5)
nRBC: 0 % (ref 0.0–0.2)

## 2022-11-16 LAB — POCT URINE DRUG SCREEN - MANUAL ENTRY (I-SCREEN)
POC Amphetamine UR: NOT DETECTED
POC Buprenorphine (BUP): NOT DETECTED
POC Cocaine UR: NOT DETECTED
POC Marijuana UR: NOT DETECTED
POC Methadone UR: NOT DETECTED
POC Methamphetamine UR: NOT DETECTED
POC Morphine: NOT DETECTED
POC Oxazepam (BZO): POSITIVE — AB
POC Oxycodone UR: NOT DETECTED
POC Secobarbital (BAR): NOT DETECTED

## 2022-11-16 LAB — VITAMIN B12: Vitamin B-12: 322 pg/mL (ref 180–914)

## 2022-11-16 MED ORDER — GABAPENTIN 100 MG PO CAPS
200.0000 mg | ORAL_CAPSULE | Freq: Two times a day (BID) | ORAL | Status: DC
Start: 2022-11-16 — End: 2022-11-16

## 2022-11-16 MED ORDER — GABAPENTIN 100 MG PO CAPS
200.0000 mg | ORAL_CAPSULE | Freq: Two times a day (BID) | ORAL | Status: AC
  Administered 2022-11-16 – 2022-11-18 (×5): 200 mg via ORAL
  Filled 2022-11-16 (×6): qty 2

## 2022-11-16 NOTE — Group Note (Signed)
Group Topic: Relapse and Recovery  Group Date: 11/16/2022 Start Time: 2000 End Time: 2100 Facilitators: Rae Lips B  Department: [facility]  Number of Participants: 7  Group Focus: acceptance Treatment Modality:  Exposure Therapy Interventions utilized were leisure development Purpose: express feelings  Name: Chad Tanner Date of Birth: 12-14-1990  MR: [id number]    Level of Participation: Pt did NOT attend group Quality of Participation: NA Interactions with others: NA Mood/Affect: NA Triggers (if applicable): Na Cognition: NA Progress: Other Response: NA Plan: patient will be encouraged to NA  Patients Problems:  Patient Active Problem List   Diagnosis Date Noted   Alcohol abuse 11/15/2022   Alcohol dependence (HCC) 05/01/2013   Depression 05/01/2013

## 2022-11-16 NOTE — ED Provider Notes (Addendum)
I was asked to review an EKG by the psychiatry service.  Patient is at the psychiatry facility seeking help with alcohol use disorder.  He has no chest pain.  He has been at the facility now for more than 12 hours.  EKG today is read as STEMI and they wanted me to interpreted.  Patient's EKG reveals heart rate of 72, with ST elevation in V3-V5.  Patient also have T wave inversions in inferior and lateral leads EKG is unchanged from the one yesterday.  I was informed by the psychiatry staff that patient has no chest pain or shortness of breath. There is no history of cocaine use disorder.  I requested that the psychiatry service repeat another EKG in about 10 minutes, but that it does not appear that there is any dynamic changes from the EKG yesterday.  Patient should get outpatient cardiology follow-up for echocardiogram.   Derwood Kaplan, MD 11/16/22 1359  2:18 PM ED ECG REPORT   Date: 11/16/2022  Rate: 75  Rhythm: normal sinus rhythm  QRS Axis: normal  Intervals: normal, QT is prolonged  ST/T Wave abnormalities: nonspecific ST/T changes, ST elevations anteriorly, and T wave inversion inferior and laterally  Conduction Disutrbances:none  Narrative Interpretation:   Old EKG Reviewed: unchanged  I have personally reviewed the EKG tracing and agree with the computerized printout as noted.     Derwood Kaplan, MD 11/16/22 1419

## 2022-11-16 NOTE — ED Notes (Signed)
Patient resting in bedroom. Respirations even and unlabored. No acute distress noted. Environment secured. Will continue to monitor for safety.

## 2022-11-16 NOTE — ED Notes (Signed)
Patient is resting with no sxs of distress noted - will continue to monitor for safety 

## 2022-11-16 NOTE — ED Notes (Signed)
Patient A&Ox4. Denies intent to harm self/others when asked. Denies A/VH. No acute distress noted. Support and encouragement provided. Routine safety checks conducted according to facility protocol. Encouraged patient to notify staff if thoughts of harm toward self or others arise. Endorses safety. Patient verbalized understanding and agreement. Will continue to monitor for safety.

## 2022-11-16 NOTE — Group Note (Deleted)
Group Topic: Healthy Self Image and Positive Change  Group Date: 11/16/2022 Start Time: 1000 End Time: 1100 Facilitators: Ninfa Linden, NT+3 MHT2  Department: [facility]  Number of Participants: 6  Group Focus: feeling awareness/expression Treatment Modality:  Psychoeducation Interventions utilized were group exercise Purpose: express feelings  Name: Chad Tanner Date of Birth: October 14, 1990  MR: [id number]    Level of Participation: Patient did not attend group. Quality of Participation: N/A Interactions with others: N/A Mood/Affect: N/A Triggers (if applicable): N/A Cognition: N/A Progress: N/A Response: N/A Plan: N/A  Patients Problems:  Patient Active Problem List   Diagnosis Date Noted   Alcohol abuse 11/15/2022   Alcohol dependence (HCC) 05/01/2013   Depression 05/01/2013

## 2022-11-16 NOTE — ED Notes (Addendum)
Patient observed/assessed at bedside verbalizing no complaint at this time. Patient alert and oriented x 4. Affect is flat.  Patient denies pain and anxiety. He denies A/V/H. He denies having any thoughts/plan of self harm and harm towards others. Fluid and snack offered. Patient states that appetite has been good throughout the day. Verbalizes no further complaints at this time. Will continue to monitor and support.

## 2022-11-16 NOTE — Progress Notes (Addendum)
Pt's EKG was critical, Acute MI/STEMI. Pt is asymptomatic. Dr. Weston Brass was notified. Awaiting orders.

## 2022-11-16 NOTE — Group Note (Signed)
Group Topic: Healthy Self Image and Positive Change  Group Date: 11/16/2022 Start Time: 1000 End Time: 1100 Facilitators: Ninfa Linden, NT+3 MHT2  Department: [facility]  Number of Participants: 6  Group Focus: feeling awareness/expression Treatment Modality:  Psychoeducation Interventions utilized were group exercise Purpose: express feelings  Name: Chad Tanner Date of Birth: October 14, 1990  MR: [id number]    Level of Participation: Patient did not attend group. Quality of Participation: N/A Interactions with others: N/A Mood/Affect: N/A Triggers (if applicable): N/A Cognition: N/A Progress: N/A Response: N/A Plan: N/A  Patients Problems:  Patient Active Problem List   Diagnosis Date Noted   Alcohol abuse 11/15/2022   Alcohol dependence (HCC) 05/01/2013   Depression 05/01/2013

## 2022-11-16 NOTE — Group Note (Signed)
Group Topic: Relaxation  Group Date: 11/16/2022 Start Time: 1545 End Time: 1615 Facilitators: Dickie La, RN  Department: [facility]  Number of Participants: 7  Group Focus: relaxation Treatment Modality:  Psychoeducation Interventions utilized were support Purpose: increase insight  Name: Chad Tanner Date of Birth: 1991-02-05  MR: [id number]    Level of Participation: minimal Quality of Participation: cooperative Interactions with others: gave feedback Mood/Affect: appropriate Triggers (if applicable): none Cognition: coherent/clear Progress: Minimal Response: Pt went briefly to the courtyard for relaxation group. Plan: follow-up needed  Patients Problems:  Patient Active Problem List   Diagnosis Date Noted   Alcohol abuse 11/15/2022   Alcohol dependence (HCC) 05/01/2013   Depression 05/01/2013

## 2022-11-16 NOTE — ED Provider Notes (Signed)
Behavioral Health Progress Note  Date and Time: 11/16/2022 11:08 AM Name: Chad Tanner MRN:  [id number]  Subjective:   The patient is a 32 year old male with no history of psychiatric admission or suicide attempts, no behavioral health notes available for review.  The patient reports a longstanding history of alcohol use disorder that involves a recent DUI charge (patient has an upcoming court date on October 23).  The patient has requested residential rehab placement at [facility]  He is currently unemployed but has a supportive partner.  The patient reports drinking large amounts of liquor, approximately half a gallon per day.  He reports experiencing a history of withdrawal symptoms, that typically involve tremulousness and diaphoresis.  He denies any history of seizure or DTs.  He reports experiencing his typical withdrawal symptoms and says he is uncomfortable.  He does appear somewhat uncomfortable and irritable.  We discussed the addition of gabapentin in the short-term for management of his withdrawal symptoms and he was agreeable.  The patient reports experiencing some depression recently along with decreased appetite and anhedonia.  Because of the patient's present withdrawal symptoms, we will defer antidepressant medication for now.  The patient reports no previous trials with antidepressant medication.  The patient denies experiencing any hopelessness or suicidal thoughts.    Diagnosis:  Final diagnoses:  Alcohol use disorder    Total Time spent with patient: 20 minutes  Past Psychiatric History: as above Past Medical History: as above Family History: none Family Psychiatric  History: none Social History: as above and per H and P  Additional Social History:  See H and P                  Sleep: Fair  Appetite:  poor  Current Medications:  Current Facility-Administered Medications  Medication Dose Route Frequency Provider Last Rate Last Admin   acetaminophen  (TYLENOL) tablet 650 mg  650 mg Oral Q6H PRN Marlou Sa, NP       alum & mag hydroxide-simeth (MAALOX/MYLANTA) 200-200-20 MG/5ML suspension 30 mL  30 mL Oral Q4H PRN Rayburn Go, Veronique M, NP       hydrOXYzine (ATARAX) tablet 25 mg  25 mg Oral Q6H PRN Rayburn Go, Veronique M, NP       loperamide (IMODIUM) capsule 2-4 mg  2-4 mg Oral PRN Rayburn Go, Veronique M, NP       LORazepam (ATIVAN) tablet 1 mg  1 mg Oral Q6H PRN Rayburn Go, Veronique M, NP       LORazepam (ATIVAN) tablet 1 mg  1 mg Oral QID Rayburn Go, Veronique M, NP   1 mg at 11/16/22 9147   Followed by   Melene Muller ON 11/17/2022] LORazepam (ATIVAN) tablet 1 mg  1 mg Oral TID Rayburn Go, Veronique M, NP       Followed by   Melene Muller ON 11/18/2022] LORazepam (ATIVAN) tablet 1 mg  1 mg Oral BID Rayburn Go, Veronique M, NP       Followed by   Melene Muller ON 11/20/2022] LORazepam (ATIVAN) tablet 1 mg  1 mg Oral Daily Byungura, Veronique M, NP       magnesium hydroxide (MILK OF MAGNESIA) suspension 30 mL  30 mL Oral Daily PRN Rayburn Go, Veronique M, NP       multivitamin with minerals tablet 1 tablet  1 tablet Oral Daily Marlou Sa, NP   1 tablet at 11/16/22 0855   ondansetron (ZOFRAN-ODT) disintegrating tablet 4 mg  4 mg Oral Q6H PRN Marlou Sa, NP  thiamine (VITAMIN B1) tablet 100 mg  100 mg Oral Daily Rayburn Go, Veronique M, NP   100 mg at 11/16/22 0856   traZODone (DESYREL) tablet 50 mg  50 mg Oral QHS PRN Marlou Sa, NP       Current Outpatient Medications  Medication Sig Dispense Refill   cyclobenzaprine (FLEXERIL) 10 MG tablet Take 1 tablet (10 mg total) by mouth 2 (two) times daily as needed for muscle spasms. 20 tablet 0   ibuprofen (ADVIL,MOTRIN) 200 MG tablet Take 800 mg by mouth every 6 (six) hours as needed for mild pain.     naproxen (NAPROSYN) 500 MG tablet Take 1 tablet (500 mg total) by mouth 2 (two) times daily. 30 tablet 0    Labs  Lab Results:  No visits with results within 6 Month(s) from this  visit.  Latest known visit with results is:  Admission on 07/19/2018, Discharged on 07/19/2018  Component Date Value Ref Range Status   ABO/RH(D) 07/19/2018 A NEG   Final   Antibody Screen 07/19/2018 NEG   Final   Sample Expiration 07/19/2018 07/22/2018,2359   Final   Unit Number 07/19/2018 [id number]   Final   Blood Component Type 07/19/2018 RBC, LR IRR   Final   Unit division 07/19/2018 00   Final   Status of Unit 07/19/2018 REL FROM [facility]   Final   Unit tag comment 07/19/2018 EMERGENCY RELEASE   Final   Transfusion Status 07/19/2018 OK TO TRANSFUSE   Final   Crossmatch Result 07/19/2018    Final                   Value:NOT NEEDED Performed at [facility], [address], [place], [place] [address]    Unit Number 07/19/2018 [id number]   Final   Blood Component Type 07/19/2018 RED CELLS,LR   Final   Unit division 07/19/2018 00   Final   Status of Unit 07/19/2018 REL FROM [facility]   Final   Unit tag comment 07/19/2018 EMERGENCY RELEASE   Final   Transfusion Status 07/19/2018 OK TO TRANSFUSE   Final   Crossmatch Result 07/19/2018 NOT NEEDED   Final   Unit Number 07/19/2018 [id number]   Final   Blood Component Type 07/19/2018 THAWED PLASMA   Final   Unit division 07/19/2018 00   Final   Status of Unit 07/19/2018 REL FROM [facility]   Final   Unit tag comment 07/19/2018 EMERGENCY RELEASE   Final   Transfusion Status 07/19/2018    Final                   Value:OK TO TRANSFUSE Performed at [facility], [address] [place], [place] [address]    Unit Number 07/19/2018 [id number]   Final   Blood Component Type 07/19/2018 THAWED PLASMA   Final   Unit division 07/19/2018 00   Final   Status of Unit 07/19/2018 REL FROM [facility]   Final   Unit tag comment 07/19/2018 EMERGENCY RELEASE   Final   Transfusion Status 07/19/2018 OK TO TRANSFUSE   Final   ISSUE DATE / TIME 07/19/2018 [id number]   Final   Blood Product Unit Number 07/19/2018 [id number]   Final   Unit Type  and Rh 07/19/2018 5100   Final   Blood Product Expiration Date 07/19/2018 [id number]   Final   ISSUE DATE / TIME 07/19/2018 [id number]   Final   Blood Product Unit Number 07/19/2018 [id number]   Final  Unit Type and Rh 07/19/2018 5100   Final   Blood Product Expiration Date 07/19/2018 [id number]   Final   Sodium 07/19/2018 144  135 - 145 mmol/L Final   Potassium 07/19/2018 4.2  3.5 - 5.1 mmol/L Final   SLIGHT HEMOLYSIS   Chloride 07/19/2018 108  98 - 111 mmol/L Final   CO2 07/19/2018 21 (L)  22 - 32 mmol/L Final   Glucose, Bld 07/19/2018 99  70 - 99 mg/dL Final   BUN 09/32/3557 6  6 - 20 mg/dL Final   Creatinine, Ser 07/19/2018 1.08  0.61 - 1.24 mg/dL Final   Calcium 32/20/2542 9.1  8.9 - 10.3 mg/dL Final   Total Protein 70/62/3762 7.4  6.5 - 8.1 g/dL Final   Albumin 83/15/1761 4.8  3.5 - 5.0 g/dL Final   AST 60/73/7106 70 (H)  15 - 41 U/L Final   ALT 07/19/2018 47 (H)  0 - 44 U/L Final   Alkaline Phosphatase 07/19/2018 60  38 - 126 U/L Final   Total Bilirubin 07/19/2018 0.6  0.3 - 1.2 mg/dL Final   GFR calc non Af Amer 07/19/2018 >60  >60 mL/min Final   GFR calc Af Amer 07/19/2018 >60  >60 mL/min Final   Anion gap 07/19/2018 15  5 - 15 Final   Performed at [facility], [address], [place], [place] [address]   WBC 07/19/2018 5.5  4.0 - 10.5 K/uL Final   RBC 07/19/2018 4.92  4.22 - 5.81 MIL/uL Final   Hemoglobin 07/19/2018 15.5  13.0 - 17.0 g/dL Final   HCT 54/62/7035 45.1  39.0 - 52.0 % Final   MCV 07/19/2018 91.7  80.0 - 100.0 fL Final   MCH 07/19/2018 31.5  26.0 - 34.0 pg Final   MCHC 07/19/2018 34.4  30.0 - 36.0 g/dL Final   RDW 00/93/8182 14.7  11.5 - 15.5 % Final   Platelets 07/19/2018 278  150 - 400 K/uL Final   nRBC 07/19/2018 0.0  0.0 - 0.2 % Final   Performed at [facility], [address] [place], [place] [address]   Alcohol, Ethyl (B) 07/19/2018 255 (H)  <10 mg/dL Final   Comment: (NOTE) Lowest detectable limit for serum alcohol is 10  mg/dL. For medical purposes only. Performed at [facility], [address] [place], [place] [address]    Lactic Acid, Venous 07/19/2018 2.3 (HH)  0.5 - 1.9 mmol/L Final   Comment: CRITICAL RESULT CALLED TO, READ BACK BY AND VERIFIED WITH: Berta Minor AT 1040 07/19/2018 BY L BENFIELD Performed at [facility], [address] [place], [place] [address]    Prothrombin Time 07/19/2018 12.9  11.4 - 15.2 seconds Final   INR 07/19/2018 1.0  0.8 - 1.2 Final   Comment: (NOTE) INR goal varies based on device and disease states. Performed at [facility], [address] [place], [place] [address]    Sodium 07/19/2018 144  135 - 145 mmol/L Final   Potassium 07/19/2018 4.1  3.5 - 5.1 mmol/L Final   Chloride 07/19/2018 112 (H)  98 - 111 mmol/L Final   BUN 07/19/2018 7  6 - 20 mg/dL Final   Creatinine, Ser 07/19/2018 1.50 (H)  0.61 - 1.24 mg/dL Final   Glucose, Bld 25/85/2778 94  70 - 99 mg/dL Final   Calcium, Ion 24/23/5361 0.96 (L)  1.15 - 1.40 mmol/L Final   TCO2 07/19/2018 22  22 - 32 mmol/L Final  Hemoglobin 07/19/2018 16.3  13.0 - 17.0 g/dL Final   HCT 40/98/1191 48.0  39.0 - 52.0 % Final   ISSUE DATE / TIME 07/19/2018 [id number]   Final   Blood Product Unit Number 07/19/2018 [id number]   Final   Unit Type and Rh 07/19/2018 6200   Final   Blood Product Expiration Date 07/19/2018 [id number]   Final   ISSUE DATE / TIME 07/19/2018 [id number]   Final   Blood Product Unit Number 07/19/2018 [id number]   Final   Unit Type and Rh 07/19/2018 6200   Final   Blood Product Expiration Date 07/19/2018 [id number]   Final   ABO/RH(D) 07/19/2018    Final                   Value:A NEG Performed at [facility], [address] [place], [place] [address]     Blood Alcohol level:  Lab Results  Component Value Date   ETH 255 (H) 07/19/2018   ETH 345 (H) 05/01/2013    Metabolic Disorder Labs: No results found for: "HGBA1C", "MPG" No results found for:  "PROLACTIN" No results found for: "CHOL", "TRIG", "HDL", "CHOLHDL", "VLDL", "LDLCALC"  Therapeutic Lab Levels: No results found for: "LITHIUM" No results found for: "VALPROATE" No results found for: "CBMZ"  Physical Findings   PHQ2-9    Flowsheet Row ED from 11/15/2022 in [facility]  PHQ-2 Total Score 4  PHQ-9 Total Score 10      Flowsheet Row ED from 11/15/2022 in [facility] Most recent reading at 11/15/2022 10:24 PM ED from 11/15/2022 in [facility] Most recent reading at 11/15/2022  4:21 PM  C-SSRS RISK CATEGORY No Risk No Risk        Musculoskeletal  Strength & Muscle Tone: within normal limits Gait & Station: normal Patient leans: N/A  Psychiatric Specialty Exam  Presentation General Appearance: Appropriate for Environment  Eye Contact:Fair  Speech:Clear and Coherent  Speech Volume:Normal  Handedness:-- (not assessed)   Mood and Affect  Mood: depressed  Affect:Congruent   Thought Process  Thought Processes:Coherent; Linear  Descriptions of Associations:Intact  Orientation:Full (Time, Place and Person)  Thought Content:Logical    Hallucinations:Hallucinations: None  Ideas of Reference:None  Suicidal Thoughts:Suicidal Thoughts: No  Homicidal Thoughts:Homicidal Thoughts: No   Sensorium  Memory:Immediate Fair; Recent Fair; Remote Fair  Judgment:Fair  Insight:Fair   Executive Functions  Concentration:Fair  Attention Span:Fair  Recall:Fair  Fund of Knowledge:Fair  Language:Fair   Psychomotor Activity  Psychomotor Activity:Psychomotor Activity: Normal   Assets  Assets:Communication Skills; Resilience   Sleep  Sleep:Sleep: poor   Nutritional Assessment (For OBS and FBC admissions only) Has the patient had a weight loss or gain of 10 pounds or more in the last 3 months?: No Has the patient had a decrease in food intake/or appetite?:  Yes Does the patient have dental problems?: No Does the patient have eating habits or behaviors that may be indicators of an eating disorder including binging or inducing vomiting?: No Has the patient recently lost weight without trying?: 0 Has the patient been eating poorly because of a decreased appetite?: 0 Malnutrition Screening Tool Score: 0    Physical Exam Constitutional:      Appearance: the patient is not toxic-appearing.  Pulmonary:     Effort: Pulmonary effort is normal.  Neurological:     General: No focal deficit present.     Mental Status: the patient is alert and oriented to  person, place, and time.   Review of Systems  Respiratory:  Negative for shortness of breath.   Cardiovascular:  Negative for chest pain.  Gastrointestinal:  Negative for abdominal pain, constipation, diarrhea, nausea and vomiting.  Neurological:  Negative for headaches.    BP 119/72   Pulse (!) 52   Temp 98 F (36.7 C) (Oral)   Resp 18   SpO2 100%   Assessment and Plan:  Alcohol use disorder, severe - CIWA with as needed Ativan - Ativan taper - Add gabapentin 200 mg twice daily for residual withdrawal symptoms - Thiamine  Medical monitoring: - Repeat EKG given prolonged QT interval - Obtain routine lab work  Patient reports receiving clindamycin for dental abscess, the course of which was not completed.  Will ask pharmacy to call and verify.  May restart the medication pending information obtained.  Could plan to start clindamycin 300 mg 4 times daily for 7 days.  Will need to discuss risk of C. difficile with the patient.    Carlyn Reichert, MD 11/16/2022 11:08 AM

## 2022-11-16 NOTE — ED Notes (Signed)
Patient is sleeping. Respirations equal and unlabored, skin warm and dry. No change in assessment or acuity. Routine safety checks conducted according to facility protocol. Will continue to monitor for safety.   

## 2022-11-16 NOTE — ED Provider Notes (Signed)
The patient's second EKG showed an interpretation of STEMI.  I went to assess the patient who stated that he had no chest pain and no history of any heart conditions.  He already denied using any other substances other than alcohol.  He was in no acute distress.  I called the emergency department provider Dr. Rhunette Croft, who was very helpful in interpreting the EKG.  He requested EKG reorder.  Please see his note for further details.  The patient was not transferred to the emergency department based on very low clinical suspicion for ACS.  Patient does need cardiology follow-up per Dr. Rhunette Croft.  We will start by getting the patient connected with a primary care doctor and we will continue to discuss the importance of him seeing a cardiologist.    His QT interval has remained prolonged.  I reviewed his medication list and remove trazodone due to possible ability to prolong the QT.  The other medications that he is on are fine.   Carlyn Reichert, MD PGY-3

## 2022-11-17 DIAGNOSIS — F1090 Alcohol use, unspecified, uncomplicated: Secondary | ICD-10-CM | POA: Diagnosis not present

## 2022-11-17 NOTE — ED Notes (Signed)
Pt is in the dayroom watching TV with peers. Pt denies SI/HI/AVH. No acute distress noted. Will continue to monitor for safety. 

## 2022-11-17 NOTE — ED Notes (Signed)
Patient is sleeping. Respirations equal and unlabored, skin warm and dry. No change in assessment or acuity. Routine safety checks conducted according to facility protocol. Will continue to monitor for safety.   

## 2022-11-17 NOTE — ED Notes (Signed)
Patient is resting with no sxs of distress noted - will continue to monitor for safety 

## 2022-11-17 NOTE — Progress Notes (Signed)
Pt is awake, alert and oriented X3. Pt did not voice any complaints of pain or discomfort. No signs of acute distress noted. Administered scheduled meds per order. Pt denies current SI/HI/AVH, plan or intent. Staff will monitor for pt's safety.

## 2022-11-17 NOTE — Group Note (Signed)
Group Topic: Recovery Basics  Group Date: 11/17/2022 Start Time: 1000 End Time: 1045 Facilitators: Priscille Kluver, NT  Department: [facility]  Number of Participants: 5  Group Focus: substance abuse education Treatment Modality:  Individual Therapy Interventions utilized were support Purpose: relapse prevention strategies  Name: Chad Tanner Date of Birth: 07/27/1990  MR: [id number]    Level of Participation: moderate Quality of Participation: attention seeking and attentive Interactions with others: gave feedback Mood/Affect: positive Cognition: coherent/clear Progress: Gaining insight Response: Wanting to go to a treatment center.  Patients Problems:  Patient Active Problem List   Diagnosis Date Noted   Alcohol abuse 11/15/2022   Alcohol dependence (HCC) 05/01/2013   Depression 05/01/2013

## 2022-11-17 NOTE — Group Note (Signed)
Group Topic: Communication  Group Date: 11/17/2022 Start Time: 2000 End Time: 2100 Facilitators: Rae Lips B  Department: [facility]  Number of Participants: 5  Group Focus: acceptance Treatment Modality:  Leisure Development Interventions utilized were leisure development Purpose: express feelings and express irrational fears  Name: Chad Tanner Date of Birth: 1990-07-17  MR: [id number]    Level of Participation Pt did not attend group Quality of Participation: NA Interactions with others: NA Mood/Affect: NA Triggers (if applicable): NA Cognition: NA Progress: Other Response: NA Plan: patient will be encouraged to go to group  Patients Problems:  Patient Active Problem List   Diagnosis Date Noted   Alcohol abuse 11/15/2022   Alcohol dependence (HCC) 05/01/2013   Depression 05/01/2013

## 2022-11-17 NOTE — ED Provider Notes (Signed)
Spoke with on-call cardiologist regarding this patient's abnormal EKGs and the case history as outlined in previous notes.  She recommends repeat EKG and lab work, as well as ambulatory referral to cardiology.  She states that the patient will likely need testing for congenital long QT syndrome.   Carlyn Reichert, MD PGY-3

## 2022-11-17 NOTE — ED Provider Notes (Signed)
Behavioral Health Progress Note  Date and Time: 11/17/2022 10:45 AM Name: Chad Tanner MRN:  [id number]  Subjective:   The patient is a 32 year old male with no history of psychiatric admission or suicide attempts, no behavioral health notes available for review.  The patient reports a longstanding history of alcohol use disorder that involves a recent DUI charge (patient has an upcoming court date on October 23).  The patient has requested residential rehab placement at [facility].  He is currently unemployed but has a supportive partner.  Today the patient reports that his withdrawal is improved and that he is feeling well.  He reports experiencing mild occasional diaphoresis but otherwise has no symptoms.  He denies experiencing any hopelessness or suicidal thoughts.  We discussed his prolonged QT interval on both repeat EKGs, and that it is necessary for him to get quick follow-up with a primary care physician and cardiology once he is done with treatment.  We discussed that information will be put in the after visit summary for primary care physician resources and cardiology follow-up.  The patient expresses understanding and agreed to follow-up.    Diagnosis:  Final diagnoses:  Alcohol use disorder    Total Time spent with patient: 20 minutes  Past Psychiatric History: as above Past Medical History: as above Family History: none Family Psychiatric  History: none Social History: as above and per H and P  Additional Social History:  See H and P                  Sleep: Fair  Appetite:  poor  Current Medications:  Current Facility-Administered Medications  Medication Dose Route Frequency Provider Last Rate Last Admin   acetaminophen (TYLENOL) tablet 650 mg  650 mg Oral Q6H PRN Marlou Sa, NP       alum & mag hydroxide-simeth (MAALOX/MYLANTA) 200-200-20 MG/5ML suspension 30 mL  30 mL Oral Q4H PRN Rayburn Go, Veronique M, NP       gabapentin (NEURONTIN) capsule  200 mg  200 mg Oral BID Carlyn Reichert, MD   200 mg at 11/17/22 4098   hydrOXYzine (ATARAX) tablet 25 mg  25 mg Oral Q6H PRN Marlou Sa, NP       loperamide (IMODIUM) capsule 2-4 mg  2-4 mg Oral PRN Rayburn Go, Veronique M, NP       LORazepam (ATIVAN) tablet 1 mg  1 mg Oral Q6H PRN Rayburn Go, Veronique M, NP       LORazepam (ATIVAN) tablet 1 mg  1 mg Oral QID Byungura, Veronique M, NP   1 mg at 11/17/22 1191   Followed by   LORazepam (ATIVAN) tablet 1 mg  1 mg Oral TID Rayburn Go, Veronique M, NP       Followed by   Melene Muller ON 11/18/2022] LORazepam (ATIVAN) tablet 1 mg  1 mg Oral BID Rayburn Go, Veronique M, NP       Followed by   Melene Muller ON 11/20/2022] LORazepam (ATIVAN) tablet 1 mg  1 mg Oral Daily Byungura, Veronique M, NP       magnesium hydroxide (MILK OF MAGNESIA) suspension 30 mL  30 mL Oral Daily PRN Rayburn Go, Veronique M, NP       multivitamin with minerals tablet 1 tablet  1 tablet Oral Daily Marlou Sa, NP   1 tablet at 11/17/22 0834   ondansetron (ZOFRAN-ODT) disintegrating tablet 4 mg  4 mg Oral Q6H PRN Marlou Sa, NP       thiamine (VITAMIN B1) tablet 100  mg  100 mg Oral Daily Olin Pia M, NP   100 mg at 11/17/22 1610   No current outpatient medications on file.    Labs  Lab Results:  Admission on 11/15/2022  Component Date Value Ref Range Status   POC Amphetamine UR 11/16/2022 None Detected  NONE DETECTED (Cut Off Level 1000 ng/mL) Final   POC Secobarbital (BAR) 11/16/2022 None Detected  NONE DETECTED (Cut Off Level 300 ng/mL) Final   POC Buprenorphine (BUP) 11/16/2022 None Detected  NONE DETECTED (Cut Off Level 10 ng/mL) Final   POC Oxazepam (BZO) 11/16/2022 Positive (A)  NONE DETECTED (Cut Off Level 300 ng/mL) Final   POC Cocaine UR 11/16/2022 None Detected  NONE DETECTED (Cut Off Level 300 ng/mL) Final   POC Methamphetamine UR 11/16/2022 None Detected  NONE DETECTED (Cut Off Level 1000 ng/mL) Final   POC Morphine 11/16/2022 None Detected   NONE DETECTED (Cut Off Level 300 ng/mL) Final   POC Methadone UR 11/16/2022 None Detected  NONE DETECTED (Cut Off Level 300 ng/mL) Final   POC Oxycodone UR 11/16/2022 None Detected  NONE DETECTED (Cut Off Level 100 ng/mL) Final   POC Marijuana UR 11/16/2022 None Detected  NONE DETECTED (Cut Off Level 50 ng/mL) Final   WBC 11/16/2022 5.1  4.0 - 10.5 K/uL Final   RBC 11/16/2022 4.93  4.22 - 5.81 MIL/uL Final   Hemoglobin 11/16/2022 15.1  13.0 - 17.0 g/dL Final   HCT 96/05/5407 43.3  39.0 - 52.0 % Final   MCV 11/16/2022 87.8  80.0 - 100.0 fL Final   MCH 11/16/2022 30.6  26.0 - 34.0 pg Final   MCHC 11/16/2022 34.9  30.0 - 36.0 g/dL Final   RDW 81/19/1478 16.7 (H)  11.5 - 15.5 % Final   Platelets 11/16/2022 278  150 - 400 K/uL Final   nRBC 11/16/2022 0.0  0.0 - 0.2 % Final   Neutrophils Relative % 11/16/2022 57  % Final   Neutro Abs 11/16/2022 2.9  1.7 - 7.7 K/uL Final   Lymphocytes Relative 11/16/2022 33  % Final   Lymphs Abs 11/16/2022 1.7  0.7 - 4.0 K/uL Final   Monocytes Relative 11/16/2022 8  % Final   Monocytes Absolute 11/16/2022 0.4  0.1 - 1.0 K/uL Final   Eosinophils Relative 11/16/2022 1  % Final   Eosinophils Absolute 11/16/2022 0.0  0.0 - 0.5 K/uL Final   Basophils Relative 11/16/2022 1  % Final   Basophils Absolute 11/16/2022 0.0  0.0 - 0.1 K/uL Final   Immature Granulocytes 11/16/2022 0  % Final   Abs Immature Granulocytes 11/16/2022 0.02  0.00 - 0.07 K/uL Final   Performed at [facility], [address] [place], [place] [address]   Sodium 11/16/2022 137  135 - 145 mmol/L Final   Potassium 11/16/2022 4.1  3.5 - 5.1 mmol/L Final   Chloride 11/16/2022 100  98 - 111 mmol/L Final   CO2 11/16/2022 27  22 - 32 mmol/L Final   Glucose, Bld 11/16/2022 97  70 - 99 mg/dL Final   Glucose reference range applies only to samples taken after fasting for at least 8 hours.   BUN 11/16/2022 10  6 - 20 mg/dL Final   Creatinine, Ser 11/16/2022 1.13  0.61 - 1.24 mg/dL Final   Calcium  13/09/6576 9.1  8.9 - 10.3 mg/dL Final   Total Protein 46/96/2952 6.8  6.5 - 8.1 g/dL Final   Albumin 84/13/2440 3.7  3.5 - 5.0 g/dL Final  AST 11/16/2022 32  15 - 41 U/L Final   ALT 11/16/2022 16  0 - 44 U/L Final   Alkaline Phosphatase 11/16/2022 51  38 - 126 U/L Final   Total Bilirubin 11/16/2022 1.3 (H)  0.3 - 1.2 mg/dL Final   GFR, Estimated 11/16/2022 >60  >60 mL/min Final   Comment: (NOTE) Calculated using the CKD-EPI Creatinine Equation (2021)    Anion gap 11/16/2022 10  5 - 15 Final   Performed at [facility], [address] [place], [place] [address]   TSH 11/16/2022 1.599  0.350 - 4.500 uIU/mL Final   Comment: Performed by a 3rd Generation assay with a functional sensitivity of <=0.01 uIU/mL. Performed at [facility], [address] [place], [place] [address]    Vitamin B-12 11/16/2022 322  180 - 914 pg/mL Final   Comment: (NOTE) This assay is not validated for testing neonatal or myeloproliferative syndrome specimens for Vitamin B12 levels. Performed at [facility], [address] [place], [place] [address]     Blood Alcohol level:  Lab Results  Component Value Date   ETH 255 (H) 07/19/2018   ETH 345 (H) 05/01/2013    Metabolic Disorder Labs: No results found for: "HGBA1C", "MPG" No results found for: "PROLACTIN" No results found for: "CHOL", "TRIG", "HDL", "CHOLHDL", "VLDL", "LDLCALC"  Therapeutic Lab Levels: No results found for: "LITHIUM" No results found for: "VALPROATE" No results found for: "CBMZ"  Physical Findings   PHQ2-9    Flowsheet Row ED from 11/15/2022 in [facility]  PHQ-2 Total Score 4  PHQ-9 Total Score 10      Flowsheet Row ED from 11/15/2022 in [facility] Most recent reading at 11/15/2022 10:24 PM ED from 11/15/2022 in [facility] [place] Most recent reading at 11/15/2022  4:21 PM  C-SSRS RISK CATEGORY No Risk No Risk         Musculoskeletal  Strength & Muscle Tone: within normal limits Gait & Station: normal Patient leans: N/A  Psychiatric Specialty Exam  Presentation General Appearance: Appropriate for Environment  Eye Contact:Fair  Speech:Clear and Coherent  Speech Volume:Normal  Handedness:-- (not assessed)   Mood and Affect  Mood: euthymic  Affect:Congruent   Thought Process  Thought Processes:Coherent; Linear  Descriptions of Associations:Intact  Orientation:Full (Time, Place and Person)  Thought Content:Logical    Hallucinations:Hallucinations: None  Ideas of Reference:None  Suicidal Thoughts:Suicidal Thoughts: No  Homicidal Thoughts:Homicidal Thoughts: No   Sensorium  Memory:Immediate Fair; Recent Fair; Remote Fair  Judgment:Fair  Insight:Fair   Executive Functions  Concentration:Fair  Attention Span:Fair  Recall:Fair  Fund of Knowledge:Fair  Language:Fair   Psychomotor Activity  Psychomotor Activity:Psychomotor Activity: Normal   Assets  Assets:Communication Skills; Resilience   Sleep  Sleep:Sleep: poor   Nutritional Assessment (For OBS and FBC admissions only) Has the patient had a weight loss or gain of 10 pounds or more in the last 3 months?: No Has the patient had a decrease in food intake/or appetite?: Yes Does the patient have dental problems?: No Does the patient have eating habits or behaviors that may be indicators of an eating disorder including binging or inducing vomiting?: No Has the patient recently lost weight without trying?: 0 Has the patient been eating poorly because of a decreased appetite?: 0 Malnutrition Screening Tool Score: 0    Physical Exam Constitutional:      Appearance: the patient is not toxic-appearing.  Pulmonary:  Effort: Pulmonary effort is normal.  Neurological:     General: No focal deficit present.     Mental Status: the patient is alert and oriented to person, place, and time.   Review of  Systems  Respiratory:  Negative for shortness of breath.   Cardiovascular:  Negative for chest pain.  Gastrointestinal:  Negative for abdominal pain, constipation, diarrhea, nausea and vomiting.  Neurological:  Negative for headaches.    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  It turns out that the patient last took this medication approximately 6 months ago, so we will not be restarting the medication during his stay here.    Carlyn Reichert, MD 11/17/2022 10:45 AM

## 2022-11-17 NOTE — Group Note (Signed)
Group Topic: Relaxation  Group Date: 11/17/2022 Start Time: 1530 End Time: 1600 Facilitators: Dickie La, RN  Department: [facility]  Number of Participants: 4  Group Focus: relaxation Treatment Modality:  Patient-Centered Therapy Interventions utilized were leisure development Purpose: enhance coping skills  Name: Chad Tanner Date of Birth: 06/25/1990  MR: [id number]    Level of Participation: Pt did not attend relaxation group. Quality of Participation: n/a Interactions with others: n/a Mood/Affect: n/a Triggers (if applicable): n/a Cognition: n/a Progress: n/a Response: n/a Plan: follow-up needed  Patients Problems:  Patient Active Problem List   Diagnosis Date Noted   Alcohol abuse 11/15/2022   Alcohol dependence (HCC) 05/01/2013   Depression 05/01/2013

## 2022-11-17 NOTE — Discharge Instructions (Addendum)
Please follow up with the below cardiology practice:  [facility] [address] [place],  [place]  [address]  Main: [phone]  Please follow-up with the 2 practices listed below to obtain primary care services:  [facility] [address] [place] [place] [phone]  [facility] [address] [place] [phone]  [facility] [address], [place], [address] [phone] phone  New Patient Assessment/Therapy Walk-Ins:  Monday and Wednesday: 8 am until slots are full. Every 1st and 2nd Fridays of the month: 1 pm - 5 pm.  NO ASSESSMENT/THERAPY WALK-INS ON TUESDAYS OR THURSDAYS  New Patient Assessment/Medication Management Walk-Ins:  Monday - Friday:  8 am - 11 am.  For all walk-ins, we ask that you arrive by 7:30 am because patients will be seen in the order of arrival.  Availability is limited; therefore, you may not be seen on the same day that you walk-in.  Our goal is to serve and meet the needs of our community to the best of our ability.  SUBSTANCE USE TREATMENT for Medicaid and State Funded/IPRS  [facility] [address] [place], [place], [address] [phone] phone NOTE: [facility] is no longer offering IOP services.  Serves those who are low-income or have no insurance.  [facility] [address] [place], [place], [address] [phone] phone [phone] fax NOTE: Does have Substance Abuse-Intensive Outpatient Program [facility]) as well as transitional housing if eligible.  [facility] [address] [place], [place], [address] [phone] phone [phone] fax  [facility] [phone] [place], [place], [address] [phone] phone [phone] fax  HALFWAY HOUSES:  [facility] [phone]  [employer].oxfordvacancies.com  12 STEP PROGRAMS:  Alcoholics Anonymous of  [place] [employer]  Narcotics Anonymous of [place] [employer]  Al-Anon of [place], [place] www.[place]alanon.org/find-meetings.html  Nar-Anon https://nar-anon.org/find-a-meetin  List of Residential placements:   [facility] in [place]: [phone]  [facility]: [phone]  [place], [place] [phone]: Male and male facility; 30-day program: (uninsured and Medicaid such as Laurena Bering, [place], [place], partners)  [facility]: [phone]; men and women's facility; 28 days; Can have Medicaid tailored plan Tour manager or Partners)  [facility]: [phone] Karoline Caldwell or Larita Fife; 28 day program; must be fully detox; tailored Medicaid or no insurance  [address] in [place], [place]; [phone]; 28 day all males program; no insurance accepted  [facility] in [place]: Gabriel Rung [phone] (no insurance or Medicaid only); 90 days; outpatient services but provide housing in apartments downtown [place]  [facility] Admission: [phone]: Patient must complete phone screening for placement: [place], [place]; 6 month program; uninsured, Medicaid, and [employer].   [facility]: no insurance required; [phone]  [facility]: [phone]; Intake: Molly Maduro; Must fill out application online; Alecia Lemming Delay [phone] [facility] in [place], [place]: [phone]; Admissions Coordinators Mr. Maurine Minister or Barron Alvine; 90 day program.  [facility]: [place], [place] [phone]; Co-Ed 9 month to a year program; Online application; Men entry fee is $500 (6-37months);  [employer]: [address] [place], [place] [address]; no fee or insurance required; minimum of 2 years; Highly structured; work based; Intake Coordinator is Thayer Ohm [phone]  [facility] in [place], [place]: [phone]; Fax  number is [phone]; website: www.[facility].org; Requires 3-6 page autobiography; 2 year program (18 months and then 80month transitional housing); Admission fee is $300;  no insurance needed; work Automotive engineer in [place], [place]: [employer] Desk Staff: Danise Edge [phone]: They have a Men's Regenerations Program 6-79months. Free program; There is an initial $300 fee however, they are willing to work with patients regarding that. Application is online.  [facility]: Admissions [phone] Doran Heater ext [phone]; Any 7-90 day program is out of pocket; 12 month program is free of charge; there is a $275 entry fee; Patient is responsible for own transportation

## 2022-11-18 DIAGNOSIS — F1090 Alcohol use, unspecified, uncomplicated: Secondary | ICD-10-CM | POA: Diagnosis not present

## 2022-11-18 NOTE — ED Notes (Signed)
Patient has been socially interactive with others sharing laughs. Denies SI, HI, AVH. Patient does not appear to b experiencing internal stimuli.

## 2022-11-18 NOTE — ED Notes (Signed)
Previous note entered in error. 

## 2022-11-18 NOTE — ED Provider Notes (Addendum)
Behavioral Health Progress Note  Date and Time: 11/18/2022 11:53 AM Name: Chad Tanner MRN:  [id number]  Subjective: Patient is a 32 year old male admitted for longstanding history of alcohol use disorder.  Patient also currently has a charge of driving with a revoked license, driving a car without permission.  Patient states that it is a misdemeanor charge and his court appearance is in October 23.  Patient denies any other substance use, any thoughts of self-harm, harm to others, any past psychiatric history.  He does report that he has been drinking since he was 32 years of age, has had 1 or 2 days of no use but no periods of  sobriety.  Patient denies any history of seizures, DTs with alcohol withdrawal.  This morning patient reports that he is doing fairly well, is eating fine, sleeping well.  He denies any withdrawal symptoms which include nausea, shakes, excessive sweating.  Patient states that he wants to go to [facility] recovery services as he needs residential treatment.  Patient has that he wants to be there for his 43 year old daughter and feels that him getting treatment is essential.    Diagnosis:  Final diagnoses:  Alcohol use disorder  Prolonged QT interval    Total Time spent with patient: 30 minutes  Past Psychiatric History: None reported Past Medical History: Patient's EKG done here was not normal.  Cardiology consulted, the recommendation is to repeat EKG and lab work and to send a referral to Cardiology on discharge.  Patient has noted to have QT prolongation into previous EKGs Family History: None reported. Family Psychiatric  History: None reported. Social History: Patient is currently unemployed, lives with his girlfriend in [place].  Patient has a 33 year old daughter  Additional Social History:                         Sleep: Good  Appetite:  Good  Current Medications:  Current Facility-Administered Medications  Medication  Dose Route Frequency Provider Last Rate Last Admin   acetaminophen (TYLENOL) tablet 650 mg  650 mg Oral Q6H PRN Marlou Sa, NP       alum & mag hydroxide-simeth (MAALOX/MYLANTA) 200-200-20 MG/5ML suspension 30 mL  30 mL Oral Q4H PRN Rayburn Go, Veronique M, NP       gabapentin (NEURONTIN) capsule 200 mg  200 mg Oral BID Carlyn Reichert, MD   200 mg at 11/18/22 1191   hydrOXYzine (ATARAX) tablet 25 mg  25 mg Oral Q6H PRN Marlou Sa, NP       loperamide (IMODIUM) capsule 2-4 mg  2-4 mg Oral PRN Rayburn Go, Veronique M, NP       LORazepam (ATIVAN) tablet 1 mg  1 mg Oral Q6H PRN Rayburn Go, Veronique M, NP       LORazepam (ATIVAN) tablet 1 mg  1 mg Oral TID Rayburn Go, Veronique M, NP   1 mg at 11/18/22 4782   Followed by   LORazepam (ATIVAN) tablet 1 mg  1 mg Oral BID Rayburn Go, Veronique M, NP       Followed by   Melene Muller ON 11/20/2022] LORazepam (ATIVAN) tablet 1 mg  1 mg Oral Daily Byungura, Veronique M, NP       magnesium hydroxide (MILK OF MAGNESIA) suspension 30 mL  30 mL Oral Daily PRN Marlou Sa, NP       multivitamin with minerals tablet 1 tablet  1 tablet Oral Daily Marlou Sa, NP   1 tablet at  11/18/22 0923   thiamine (VITAMIN B1) tablet 100 mg  100 mg Oral Daily Olin Pia M, NP   100 mg at 11/18/22 0865   No current outpatient medications on file.    Labs  Lab Results:  Admission on 11/15/2022  Component Date Value Ref Range Status   POC Amphetamine UR 11/16/2022 None Detected  NONE DETECTED (Cut Off Level 1000 ng/mL) Final   POC Secobarbital (BAR) 11/16/2022 None Detected  NONE DETECTED (Cut Off Level 300 ng/mL) Final   POC Buprenorphine (BUP) 11/16/2022 None Detected  NONE DETECTED (Cut Off Level 10 ng/mL) Final   POC Oxazepam (BZO) 11/16/2022 Positive (A)  NONE DETECTED (Cut Off Level 300 ng/mL) Final   POC Cocaine UR 11/16/2022 None Detected  NONE DETECTED (Cut Off Level 300 ng/mL) Final   POC Methamphetamine UR 11/16/2022 None  Detected  NONE DETECTED (Cut Off Level 1000 ng/mL) Final   POC Morphine 11/16/2022 None Detected  NONE DETECTED (Cut Off Level 300 ng/mL) Final   POC Methadone UR 11/16/2022 None Detected  NONE DETECTED (Cut Off Level 300 ng/mL) Final   POC Oxycodone UR 11/16/2022 None Detected  NONE DETECTED (Cut Off Level 100 ng/mL) Final   POC Marijuana UR 11/16/2022 None Detected  NONE DETECTED (Cut Off Level 50 ng/mL) Final   WBC 11/16/2022 5.1  4.0 - 10.5 K/uL Final   RBC 11/16/2022 4.93  4.22 - 5.81 MIL/uL Final   Hemoglobin 11/16/2022 15.1  13.0 - 17.0 g/dL Final   HCT 78/46/9629 43.3  39.0 - 52.0 % Final   MCV 11/16/2022 87.8  80.0 - 100.0 fL Final   MCH 11/16/2022 30.6  26.0 - 34.0 pg Final   MCHC 11/16/2022 34.9  30.0 - 36.0 g/dL Final   RDW 52/84/1324 16.7 (H)  11.5 - 15.5 % Final   Platelets 11/16/2022 278  150 - 400 K/uL Final   nRBC 11/16/2022 0.0  0.0 - 0.2 % Final   Neutrophils Relative % 11/16/2022 57  % Final   Neutro Abs 11/16/2022 2.9  1.7 - 7.7 K/uL Final   Lymphocytes Relative 11/16/2022 33  % Final   Lymphs Abs 11/16/2022 1.7  0.7 - 4.0 K/uL Final   Monocytes Relative 11/16/2022 8  % Final   Monocytes Absolute 11/16/2022 0.4  0.1 - 1.0 K/uL Final   Eosinophils Relative 11/16/2022 1  % Final   Eosinophils Absolute 11/16/2022 0.0  0.0 - 0.5 K/uL Final   Basophils Relative 11/16/2022 1  % Final   Basophils Absolute 11/16/2022 0.0  0.0 - 0.1 K/uL Final   Immature Granulocytes 11/16/2022 0  % Final   Abs Immature Granulocytes 11/16/2022 0.02  0.00 - 0.07 K/uL Final   Performed at [facility], [address], [place], [place] [address]   Sodium 11/16/2022 137  135 - 145 mmol/L Final   Potassium 11/16/2022 4.1  3.5 - 5.1 mmol/L Final   Chloride 11/16/2022 100  98 - 111 mmol/L Final   CO2 11/16/2022 27  22 - 32 mmol/L Final   Glucose, Bld 11/16/2022 97  70 - 99 mg/dL Final   Glucose reference range applies only to samples taken after fasting for at least 8 hours.   BUN  11/16/2022 10  6 - 20 mg/dL Final   Creatinine, Ser 11/16/2022 1.13  0.61 - 1.24 mg/dL Final   Calcium 72/53/6644 9.1  8.9 - 10.3 mg/dL Final   Total Protein 03/47/4259 6.8  6.5 - 8.1 g/dL Final   Albumin 56/38/7564  3.7  3.5 - 5.0 g/dL Final   AST 40/11/2723 32  15 - 41 U/L Final   ALT 11/16/2022 16  0 - 44 U/L Final   Alkaline Phosphatase 11/16/2022 51  38 - 126 U/L Final   Total Bilirubin 11/16/2022 1.3 (H)  0.3 - 1.2 mg/dL Final   GFR, Estimated 11/16/2022 >60  >60 mL/min Final   Comment: (NOTE) Calculated using the CKD-EPI Creatinine Equation (2021)    Anion gap 11/16/2022 10  5 - 15 Final   Performed at [facility], [address], [place], [place] [address]   TSH 11/16/2022 1.599  0.350 - 4.500 uIU/mL Final   Comment: Performed by a 3rd Generation assay with a functional sensitivity of <=0.01 uIU/mL. Performed at [facility], [address] [place], [place] [address]    Vitamin B-12 11/16/2022 322  180 - 914 pg/mL Final   Comment: (NOTE) This assay is not validated for testing neonatal or myeloproliferative syndrome specimens for Vitamin B12 levels. Performed at [facility], [address], [place], [place] [address]     Blood Alcohol level:  Lab Results  Component Value Date   ETH 255 (H) 07/19/2018   ETH 345 (H) 05/01/2013    Metabolic Disorder Labs: No results found for: "HGBA1C", "MPG" No results found for: "PROLACTIN" No results found for: "CHOL", "TRIG", "HDL", "CHOLHDL", "VLDL", "LDLCALC"  Therapeutic Lab Levels: No results found for: "LITHIUM" No results found for: "VALPROATE" No results found for: "CBMZ"  Physical Findings   PHQ2-9    Flowsheet Row ED from 11/15/2022 in [facility]  PHQ-2 Total Score 4  PHQ-9 Total Score 10      Flowsheet Row ED from 11/15/2022 in [facility] Most recent reading at 11/15/2022 10:24 PM ED from 11/15/2022 in [facility] Most recent reading at 11/15/2022  4:21 PM  C-SSRS RISK CATEGORY No Risk No Risk        Musculoskeletal  Strength & Muscle Tone: within normal limits Gait & Station: normal Patient leans: N/A  Psychiatric Specialty Exam  Presentation  General Appearance:  Appropriate for Environment  Eye Contact: Fair  Speech: Clear and Coherent  Speech Volume: Normal  Handedness: Right   Mood and Affect  Mood: Euthymic  Affect: Congruent   Thought Process  Thought Processes: Coherent; Goal Directed  Descriptions of Associations:Intact  Orientation:Full (Time, Place and Person)  Thought Content:Abstract Reasoning; Logical; WDL; Computation  Diagnosis of Schizophrenia or Schizoaffective disorder in past: No    Hallucinations:Hallucinations: None  Ideas of Reference:None  Suicidal Thoughts:Suicidal Thoughts: No  Homicidal Thoughts:Homicidal Thoughts: No   Sensorium  Memory: Immediate Fair; Remote Fair; Recent Fair  Judgment: Fair  Insight: Fair   Chartered certified accountant: Fair  Attention Span: Fair  Recall: [employer] of Knowledge: Fair  Language: Fair   Psychomotor Activity  Psychomotor Activity:Psychomotor Activity: Normal   Assets  Assets: Manufacturing systems engineer; Desire for Improvement; Social Support; Housing; Transportation   Sleep  Sleep:Sleep: Fair Number of Hours of Sleep: 7     Physical Exam  Physical Exam Review of Systems  Constitutional: Negative.   HENT: Negative.    Eyes: Negative.  Negative for blurred vision and double vision.  Cardiovascular: Negative.  Negative for chest pain and palpitations.  Gastrointestinal: Negative.  Negative for abdominal pain, heartburn, nausea and vomiting.  Skin: Negative.   Neurological: Negative.  Negative for dizziness, seizures, loss of  consciousness, weakness and headaches.  Psychiatric/Behavioral:  Positive for substance abuse. Negative for depression,  hallucinations and suicidal ideas. The patient is not nervous/anxious and does not have insomnia.    Blood pressure 135/89, pulse 74, temperature 97.9 F (36.6 C), temperature source Oral, resp. rate 18, SpO2 100%. There is no height or weight on file to calculate BMI.  Treatment Plan Summary: Daily contact with patient to assess and evaluate symptoms and progress in treatment and Plan patient would prefer to go to [facility] residential services on discharge.  Patient to continue on CIWA protocol.  With as needed Ativan.  Patient to continue gabapentin 200 mg twice daily along with thiamine.   Patient does have QT prolongation.  Patient to follow-up with outpatient cardiology upon discharge.  Nelly Rout, MD 11/18/2022 11:53 AM

## 2022-11-18 NOTE — ED Notes (Signed)
Patient is sleeping. Respirations equal and unlabored, skin warm and dry. No change in assessment or acuity. Routine safety checks conducted according to facility protocol. Will continue to monitor for safety.   

## 2022-11-18 NOTE — ED Notes (Signed)
Pt is in his room resting in bed. Pt denies SI/HI/AVH. No acute distress noted. Will continue to monitor for safety.

## 2022-11-18 NOTE — Group Note (Signed)
Group Topic: Social Support  Group Date: 11/18/2022 Start Time: 1000 End Time: 1105 Facilitators: Priscille Kluver, NT  Department: [facility]  Number of Participants: 7  Group Focus: substance abuse education Treatment Modality:  Spiritual Interventions utilized were support Purpose: relapse prevention strategies  Name: Chad Tanner Date of Birth: 1990/09/04  MR: [id number]    Level of Participation: active Quality of Participation: attentive Interactions with others: gave feedback Mood/Affect: positive Triggers (if applicable): Life challenges Cognition: coherent/clear Progress: Gaining insight  Patients Problems:  Patient Active Problem List   Diagnosis Date Noted   Alcohol abuse 11/15/2022   Alcohol dependence (HCC) 05/01/2013   Depression 05/01/2013

## 2022-11-18 NOTE — Tx Team (Signed)
LCSW, MD, and Resident met with patient to assess current mood, affect, physical state, and inquire about needs/goals while here in [facility] and after discharge. Patient reports he presented due to needing to detox from alcohol and get his life back on track. Patient reports he has been drinking since the age of 3 and realizes that it has done his life no good, so he wants to make a change. Patient reports he drinks about a half gallon of [place] daily. Patient denies any period of sobriety stating, "I can't go more than two days without drinking". Patient reports one attempt at treatment in the past at a facility in [place], however reports it was unsuccessful as he return to use a month later. Patient reports his last drink was on 11/13/22. Patient reports his goal is to go to a residential program for further treatment, and patient reports he prefers [facility]. Patient was made aware that [facility] does not accept [employer]. Patient reports he is unsure how he has that insurance and does not believe that is correct. Patient aware that LCSW will follow up with [facility] for verification of benefits, and will provide updates as received. Patient expressed understanding, agreed to plan, and provided permission for LCSW to send referrals out for review. Patient reports he lives at home with his girlfriend who he identifies as his support system. Patient reports having an upcoming court date on December 11, 2022 for driving while license revoked and unauthorized use. Patient reports he will work collaboratively with his girlfriend regarding getting his lawyer to push the court date back as needed. No other needs to report at this time.   LCSW will continue to follow and provide support to patient while on [facility] unit.   Chad Boyden, LCSW Clinical Social Worker [place] [facility] Ph: [phone]

## 2022-11-18 NOTE — ED Notes (Signed)
Patient A&O x 4. Facial expression sad and pinched. Offers little conversation, just says he is tired. He denies SI, HI, AVH. Currently attending group in the dayroom being calm and cooperative.

## 2022-11-18 NOTE — Group Note (Signed)
Group Topic: Change and Accountability  Group Date: 11/18/2022 Start Time: 1500 End Time: 1516 Facilitators: Mayer Camel L, RN  Department: [facility]  Number of Participants: 5  Group Focus: goals/reality orientation and nursing group Treatment Modality:  Psychoeducation Interventions utilized were exploration, group exercise, and patient education Purpose: increase insight and trigger / craving management  Name: SELSO MANNOR Date of Birth: 02-07-1991  MR: [id number]    Level of Participation: active Quality of Participation: cooperative, engaged, initiates communication, and motivated Interactions with others: gave feedback Mood/Affect: appropriate and positive Triggers (if applicable): N/A Cognition: coherent/clear, goal directed, insightful, and logical Progress: Gaining insight Response: Patient was able to identify and plan out 3 short term goals post d/c from [facility]. Patient appeared to have good insight on his triggers and how to avoid or cope with them. Plan: follow-up needed  Patients Problems:  Patient Active Problem List   Diagnosis Date Noted   Alcohol abuse 11/15/2022   Alcohol dependence (HCC) 05/01/2013   Depression 05/01/2013

## 2022-11-18 NOTE — ED Notes (Signed)
Patient excused from group r/t observed tiredness.

## 2022-11-18 NOTE — Group Note (Signed)
Group Topic: Wellness  Group Date: 11/18/2022 Start Time: 2015 End Time: 2045 Facilitators: Guss Bunde  Department: [facility]  Number of Participants: 5  Group Focus: check in Treatment Modality:  Psychoeducation Interventions utilized were support Purpose: reinforce self-care  Name: Chad Tanner Date of Birth: [birth year]  MR: [id number]    Level of Participation:Did not attend Quality of Participation:  Interactions with others: Mood/Affect:  Triggers (if applicable):  Cognition: Progress: Response:  Plan:   Patients Problems:  Patient Active Problem List   Diagnosis Date Noted   Alcohol abuse 11/15/2022   Alcohol dependence (HCC) 05/01/2013   Depression 05/01/2013

## 2022-11-19 DIAGNOSIS — F1090 Alcohol use, unspecified, uncomplicated: Secondary | ICD-10-CM | POA: Diagnosis not present

## 2022-11-19 MED ORDER — VITAMIN B-1 100 MG PO TABS: 100.0000 mg | ORAL_TABLET | Freq: Every day | ORAL | Status: DC

## 2022-11-19 MED ORDER — ADULT MULTIVITAMIN W/MINERALS CH: 1.0000 | ORAL_TABLET | Freq: Every day | ORAL | Status: DC

## 2022-11-19 NOTE — ED Notes (Signed)
Patient A&O x 4, ambulatory. Patient discharged AMA in no acute distress. Patient denied SI/HI, A/VH upon discharge. Pt given AVS with resources printed on paperwork. Pt belongings returned to patient from locker #27 intact. Patient escorted to lobby via staff for transport to destination. Safety maintained.

## 2022-11-19 NOTE — Discharge Planning (Addendum)
LCSW just received update from [place] [facility] that after further consideration they just realized that the patient has BCBS and cannot be accepted at their facility. LCSW to send referrals out to other facilities for review.   Fernande Boyden, LCSW Clinical Social Worker [place] [facility] Ph: [phone]

## 2022-11-19 NOTE — BH Assessment (Signed)
Patient alert & oriented x4. Denies intent to harm self or others when asked. Denies A/VH. Patient makes minimal eye contact but is pleasant and responsive to questions asked during medication administration. Patient denies any physical complaints when asked. Patient approached nurses desk to request shower shoes and while waiting for MHT to retrieve them was wringing hands. No complaints from patient at this time. No acute distress noted. Support and encouragement provided. Routine safety checks conducted per facility protocol. Encouraged patient to notify staff if any thoughts of harm towards self or others arise. Patient verbalizes understanding and agreement.  Sanjith Siwek, RN 9:49 AM 11/19/2022

## 2022-11-19 NOTE — Discharge Planning (Signed)
Patient informed LCSW that he would like to discharge at this time. Patient reports he knows that he will not have any options for placement so he does not want to waste any time. Patient made aware that LCSW is still seeking placement for the patient and that more referrals will be sent for review. Patient reports he would prefer not to be here and would rather handle his insurance issues and then return after he knows it has been cancelled. Patient reports he called [employer] for insurance verification and he was told that the policy is active and would take 30 days to remove. Patient reports he is unsure of how he has insurance as he has not applied for anything but knows someone that may have did it on his behalf. Patient reports he would just like to return home with his girlfriend at this time who he reports has no safety concerns with him returning. Patient reports he knows what to do to keep himself safe, and reports he has learned a lot within the two days of being here and reports he has coping strategies he can use to abstain from drinking. Patient reports having no desire to drink at this time. Brief counseling was provided to the patient and patient was receptive to the feedback provided. No other needs to report at this time.   MD made aware of patient's plan. Aftercare resources have been provided in his AVS for his follow up.   Fernande Boyden, LCSW Clinical Social Worker [place] [facility] Ph: [phone]

## 2022-11-19 NOTE — ED Notes (Signed)
Patient is sleeping. Respirations equal and unlabored, skin warm and dry. No change in assessment or acuity. Routine safety checks conducted according to facility protocol. Will continue to monitor for safety.   

## 2022-11-19 NOTE — ED Provider Notes (Signed)
FBC/OBS AMA Discharge Summary  Date and Time: 11/20/2022 9:45 AM  Name: Chad Tanner  MRN:  [id number]   Discharge Diagnoses:  Final diagnoses:  Alcohol use disorder  Prolonged QT interval    Subjective:  Patient requested to speak to this provider later in the afternoon, requesting discharge.  Expressed frustration at being declined from [facility].  He did not want to wait for additional referrals to other facilities.  Requested that this provider discharged him today, as he had already contacted his girlfriend to come get him.  He denied any suicidal ideations or homicidal ideations at discharge.  Verbalized understanding the risks involved in leaving AMA.  Stay Summary: Patient is a 32 year old male admitted for a longstanding history of alcohol use disorder. Patient has an upcoming court appearance on October 23rd for a misdemeanor charge of driving with a revoked license, driving a car without permission.  He was admitted to the [facility] on 11/15/2022 alcohol detox.   Alcohol use disorder, severe  - CIWA with as needed Ativan - Ativan taper - Add gabapentin 200 mg twice daily for residual withdrawal symptoms - Thiamine  Prolonged QT identified on admission "The patient's second EKG showed an interpretation of STEMI.  I went to assess the patient who stated that he had no chest pain and no history of any heart conditions.  He already denied using any other substances other than alcohol.  He was in no acute distress.   I called the emergency department provider Dr. Rhunette Croft, who was very helpful in interpreting the EKG.  He requested EKG reorder.  Please see his note for further details.  The patient was not transferred to the emergency department based on very low clinical suspicion for ACS.   Patient does need cardiology follow-up per Dr. Rhunette Croft.  We will start by getting the patient connected with a primary care doctor and we will continue to discuss the importance of him seeing a  cardiologist.     His QT interval has remained prolonged.  I reviewed his medication list and remove trazodone due to possible ability to prolong the QT.  The other medications that he is on are fine."  Patient expressed desire to leave AMA citing limited options regarding rehabilitation programs. Risks, including relapse, were thoroughly explained, and the patient verbalized understanding. Despite recommendations to remain for further care, the patient elected to discharge. At the time of leaving, the patient was evaluated and determined not to have any acute psychiatric symptoms requiring involuntary commitment (IVC). AMA form was signed, and discharge instructions were provided. Patient left in stable condition.  Total Time spent with patient: 15 minutes  Past Psychiatric History: None reported Past Medical History: Patient's EKG done here was not normal.  Cardiology consulted, the recommendation is to repeat EKG and lab work and to send a referral to Cardiology on discharge.  Patient has noted to have QT prolongation into previous EKGs Family History: None reported. Family Psychiatric  History: None reported. Social History: Patient is currently unemployed, lives with his girlfriend in [place].  Patient has a 26 year old daughter Tobacco Cessation:  Patient left AMA  Current Medications:  No current facility-administered medications for this encounter.   Current Outpatient Medications  Medication Sig Dispense Refill   Multiple Vitamin (MULTIVITAMIN WITH MINERALS) TABS tablet Take 1 tablet by mouth daily.     thiamine (VITAMIN B-1) 100 MG tablet Take 1 tablet (100 mg total) by mouth daily.      PTA Medications:  PTA Medications  Medication Sig   thiamine (VITAMIN B-1) 100 MG tablet Take 1 tablet (100 mg total) by mouth daily.   Multiple Vitamin (MULTIVITAMIN WITH MINERALS) TABS tablet Take 1 tablet by mouth daily.   Facility Ordered Medications  Medication    [EXPIRED] LORazepam (ATIVAN) tablet 1 mg   [EXPIRED] hydrOXYzine (ATARAX) tablet 25 mg   [EXPIRED] loperamide (IMODIUM) capsule 2-4 mg   [EXPIRED] LORazepam (ATIVAN) tablet 1 mg   Followed by   [COMPLETED] LORazepam (ATIVAN) tablet 1 mg   Followed by   [COMPLETED] LORazepam (ATIVAN) tablet 1 mg   [COMPLETED] cloNIDine (CATAPRES) tablet 0.1 mg   [EXPIRED] gabapentin (NEURONTIN) capsule 200 mg       11/16/2022   11:01 AM  Depression screen PHQ 2/9  Decreased Interest 2  Down, Depressed, Hopeless 2  PHQ - 2 Score 4  Altered sleeping 0  Tired, decreased energy 1  Change in appetite 3  Feeling bad or failure about yourself  1  Trouble concentrating 1  Moving slowly or fidgety/restless 0  Suicidal thoughts 0  PHQ-9 Score 10    Flowsheet Row ED from 11/15/2022 in [facility] Most recent reading at 11/15/2022 10:24 PM ED from 11/15/2022 in [facility] Most recent reading at 11/15/2022  4:21 PM  C-SSRS RISK CATEGORY No Risk No Risk       Musculoskeletal  Strength & Muscle Tone: within normal limits Gait & Station: normal Patient leans: N/A  Psychiatric Specialty Exam  Presentation  General Appearance:  Appropriate for Environment; Casual  Eye Contact: Fair  Speech: Clear and Coherent; Normal Rate  Speech Volume: Decreased  Handedness: Right   Mood and Affect  Mood: Anxious  Affect: Appropriate; Congruent   Thought Process  Thought Processes: Coherent; Goal Directed; Linear  Descriptions of Associations:Intact  Orientation:Full (Time, Place and Person)  Thought Content:Logical  Diagnosis of Schizophrenia or Schizoaffective disorder in past: No    Hallucinations:Hallucinations: None  Ideas of Reference:None  Suicidal Thoughts:Suicidal Thoughts: No  Homicidal Thoughts:Homicidal Thoughts: No   Sensorium  Memory: Immediate Good; Immediate Fair; Recent  Good  Judgment: Good  Insight: Good   Executive Functions  Concentration: Good  Attention Span: Good  Recall: Good  Fund of Knowledge: Good  Language: Good   Psychomotor Activity  Psychomotor Activity: Psychomotor Activity: Normal   Assets  Assets: Communication Skills; Desire for Improvement; Physical Health; Resilience   Sleep  Sleep: Sleep: Good   No data recorded  Physical Exam  Physical Exam Vitals and nursing note reviewed.  Constitutional:      General: He is not in acute distress.    Appearance: He is not ill-appearing.  HENT:     Head: Normocephalic and atraumatic.  Pulmonary:     Effort: Pulmonary effort is normal. No respiratory distress.  Skin:    General: Skin is warm and dry.    Review of Systems  All other systems reviewed and are negative.  Blood pressure (!) 129/91, pulse 62, temperature 97.9 F (36.6 C), temperature source Oral, resp. rate 18, SpO2 100%. There is no height or weight on file to calculate BMI.  Demographic Factors:  Male and Low socioeconomic status  Loss Factors: Legal issues  Historical Factors: NA  Risk Reduction Factors:   NA  Continued Clinical Symptoms:  Alcohol/Substance Abuse/Dependencies  Cognitive Features That Contribute To Risk:  None    Suicide Risk:  Mild:  Suicidal ideation of limited frequency, intensity, duration, and specificity.  There are no identifiable plans, no associated intent, mild dysphoria and related symptoms, good self-control (both objective and subjective assessment), few other risk factors, and identifiable protective factors, including available and accessible social support.  Plan Of Care/Follow-up recommendations:  Activity: as tolerated  Diet: heart healthy  Other: -Follow-up with your outpatient psychiatric provider -instructions on appointment date, time, and address (location) are provided to you in discharge paperwork.  -Take your psychiatric  medications as prescribed at discharge - instructions are provided to you in the discharge paperwork  -Follow-up with outpatient primary care doctor and other specialists -for management of preventative medicine and chronic medical disease, including:  Ambulatory referral to cardiology for prolonged QT recommended.  -Recommend abstinence from alcohol, tobacco, and other illicit drug use at discharge.   -If your psychiatric symptoms recur, worsen, or if you have side effects to your psychiatric medications, call your outpatient psychiatric provider, [phone] or go to the nearest emergency department.  -If suicidal thoughts recur, call your outpatient psychiatric provider, 911, [phone] or go to the nearest emergency department.   Disposition: Left AMA   Lorri Frederick, MD 11/20/2022, 9:45 AM

## 2022-11-19 NOTE — Progress Notes (Signed)
Patient resting with eyes closed in no apparent acute distress. Respirations even and unlabored. Environment secured. Safety checks in place according to facility policy.

## 2022-11-19 NOTE — Group Note (Signed)
Group Topic: Communication  Group Date: 11/19/2022 Start Time: 1645 End Time: 1652 Facilitators: Prentice Docker, RN  Department: [facility]  Number of Participants: 1  Group Focus: discharge education Treatment Modality:  Individual Therapy Interventions utilized were patient education Purpose: discharge recommendations  Name: Chad Tanner Date of Birth: 09-28-1990  MR: [id number]    Level of Participation: minimal Quality of Participation: cooperative Interactions with others gave feedback Mood/Affect: positive Triggers (if applicable): n/a Cognition: processing slowly Progress: None Response: n/a Plan: changes to discharge plan  Patients Problems:  Patient Active Problem List   Diagnosis Date Noted   Alcohol abuse 11/15/2022   Alcohol dependence (HCC) 05/01/2013   Depression 05/01/2013

## 2022-11-19 NOTE — Discharge Planning (Signed)
LCSW spoke with Marcelino Duster at [facility] who reports no insurance is listed for the patient under his demographics and SS number. Patient would be appropriate for a referral per [facility]. Referral has been sent for their review. Updates will be provided once received.   Fernande Boyden, LCSW Clinical Social Worker [facility] Ph: [phone]

## 2022-11-19 NOTE — ED Provider Notes (Signed)
Behavioral Health Progress Note  Date and Time: 11/19/2022 3:20 PM Name: Chad Tanner MRN:  [id number]  Subjective: Patient is a 32 year old male admitted for a longstanding history of alcohol use disorder. Patient has an upcoming court appearance on October 23rd for a misdemeanor charge of driving with a revoked license, driving a car without permission.  Patient evaluated on the unit. Reports sleep good. Reports appetite good. States mood is "fine, feeling better" today. Rate depression 0/10, anxiety "some" due to worrying about next steps after this hospitalization. Pt states his withdrawal symptoms are mostly improved. Reports improved shakiness and sweating. Denies nausea, vomiting, diarrhea, headache, change in appetite.  Reports goals for today include going to group and making any calls to residential treatment centers if needed. This morning, social work reported to be checking on [employer] and sending information. Patient states he prefers to go to [facility] recovery services for residential treatment. Patient is motivated to receive treatment and focused on getting better for himself and to be present for his daughter.  On interview, suicidal ideations are not present. Thoughts of self harm are not present. Homicidal ideations are not present.   There are no auditory hallucinations, visual hallucinations, or delusional thought processes.   Side effects to currently prescribed medications are none.    Diagnosis:  Final diagnoses:  Alcohol use disorder  Prolonged QT interval    Total Time spent with patient: 30 minutes  Past Psychiatric History: None reported Past Medical History: Patient's EKG done here was not normal.  Cardiology consulted, the recommendation is to repeat EKG and lab work and to send a referral to Cardiology on discharge.  Patient has noted to have QT prolongation into previous EKGs Family History: None reported. Family Psychiatric  History: None  reported. Social History: Patient is currently unemployed, lives with his girlfriend in [place].  Patient has a 2 year old daughter      Current Medications:  Current Facility-Administered Medications  Medication Dose Route Frequency Provider Last Rate Last Admin   acetaminophen (TYLENOL) tablet 650 mg  650 mg Oral Q6H PRN Marlou Sa, NP   650 mg at 11/18/22 1826   alum & mag hydroxide-simeth (MAALOX/MYLANTA) 200-200-20 MG/5ML suspension 30 mL  30 mL Oral Q4H PRN Marlou Sa, NP       [START ON 11/20/2022] LORazepam (ATIVAN) tablet 1 mg  1 mg Oral Daily Byungura, Veronique M, NP       magnesium hydroxide (MILK OF MAGNESIA) suspension 30 mL  30 mL Oral Daily PRN Rayburn Go, Veronique M, NP       multivitamin with minerals tablet 1 tablet  1 tablet Oral Daily Rayburn Go, Veronique M, NP   1 tablet at 11/19/22 0915   thiamine (VITAMIN B1) tablet 100 mg  100 mg Oral Daily Olin Pia M, NP   100 mg at 11/19/22 0915   No current outpatient medications on file.    Labs  Lab Results:  Admission on 11/15/2022  Component Date Value Ref Range Status   POC Amphetamine UR 11/16/2022 None Detected  NONE DETECTED (Cut Off Level 1000 ng/mL) Final   POC Secobarbital (BAR) 11/16/2022 None Detected  NONE DETECTED (Cut Off Level 300 ng/mL) Final   POC Buprenorphine (BUP) 11/16/2022 None Detected  NONE DETECTED (Cut Off Level 10 ng/mL) Final   POC Oxazepam (BZO) 11/16/2022 Positive (A)  NONE DETECTED (Cut Off Level 300 ng/mL) Final   POC Cocaine UR 11/16/2022 None Detected  NONE DETECTED (Cut Off Level 300  ng/mL) Final   POC Methamphetamine UR 11/16/2022 None Detected  NONE DETECTED (Cut Off Level 1000 ng/mL) Final   POC Morphine 11/16/2022 None Detected  NONE DETECTED (Cut Off Level 300 ng/mL) Final   POC Methadone UR 11/16/2022 None Detected  NONE DETECTED (Cut Off Level 300 ng/mL) Final   POC Oxycodone UR 11/16/2022 None Detected  NONE DETECTED (Cut Off Level  100 ng/mL) Final   POC Marijuana UR 11/16/2022 None Detected  NONE DETECTED (Cut Off Level 50 ng/mL) Final   WBC 11/16/2022 5.1  4.0 - 10.5 K/uL Final   RBC 11/16/2022 4.93  4.22 - 5.81 MIL/uL Final   Hemoglobin 11/16/2022 15.1  13.0 - 17.0 g/dL Final   HCT 40/98/1191 43.3  39.0 - 52.0 % Final   MCV 11/16/2022 87.8  80.0 - 100.0 fL Final   MCH 11/16/2022 30.6  26.0 - 34.0 pg Final   MCHC 11/16/2022 34.9  30.0 - 36.0 g/dL Final   RDW 47/82/9562 16.7 (H)  11.5 - 15.5 % Final   Platelets 11/16/2022 278  150 - 400 K/uL Final   nRBC 11/16/2022 0.0  0.0 - 0.2 % Final   Neutrophils Relative % 11/16/2022 57  % Final   Neutro Abs 11/16/2022 2.9  1.7 - 7.7 K/uL Final   Lymphocytes Relative 11/16/2022 33  % Final   Lymphs Abs 11/16/2022 1.7  0.7 - 4.0 K/uL Final   Monocytes Relative 11/16/2022 8  % Final   Monocytes Absolute 11/16/2022 0.4  0.1 - 1.0 K/uL Final   Eosinophils Relative 11/16/2022 1  % Final   Eosinophils Absolute 11/16/2022 0.0  0.0 - 0.5 K/uL Final   Basophils Relative 11/16/2022 1  % Final   Basophils Absolute 11/16/2022 0.0  0.0 - 0.1 K/uL Final   Immature Granulocytes 11/16/2022 0  % Final   Abs Immature Granulocytes 11/16/2022 0.02  0.00 - 0.07 K/uL Final   Performed at [facility], [address], [place], [place] [address]   Sodium 11/16/2022 137  135 - 145 mmol/L Final   Potassium 11/16/2022 4.1  3.5 - 5.1 mmol/L Final   Chloride 11/16/2022 100  98 - 111 mmol/L Final   CO2 11/16/2022 27  22 - 32 mmol/L Final   Glucose, Bld 11/16/2022 97  70 - 99 mg/dL Final   Glucose reference range applies only to samples taken after fasting for at least 8 hours.   BUN 11/16/2022 10  6 - 20 mg/dL Final   Creatinine, Ser 11/16/2022 1.13  0.61 - 1.24 mg/dL Final   Calcium 57/84/6962 9.1  8.9 - 10.3 mg/dL Final   Total Protein 95/28/4132 6.8  6.5 - 8.1 g/dL Final   Albumin 44/02/270 3.7  3.5 - 5.0 g/dL Final   AST 53/66/4403 32  15 - 41 U/L Final   ALT 11/16/2022 16  0 - 44 U/L  Final   Alkaline Phosphatase 11/16/2022 51  38 - 126 U/L Final   Total Bilirubin 11/16/2022 1.3 (H)  0.3 - 1.2 mg/dL Final   GFR, Estimated 11/16/2022 >60  >60 mL/min Final   Comment: (NOTE) Calculated using the CKD-EPI Creatinine Equation (2021)    Anion gap 11/16/2022 10  5 - 15 Final   Performed at [facility], [address], [place], [place] [address]   TSH 11/16/2022 1.599  0.350 - 4.500 uIU/mL Final   Comment: Performed by a 3rd Generation assay with a functional sensitivity of <=0.01 uIU/mL. Performed at [facility],  [address] [place], [place] [address]    Vitamin B-12 11/16/2022 322  180 - 914 pg/mL Final   Comment: (NOTE) This assay is not validated for testing neonatal or myeloproliferative syndrome specimens for Vitamin B12 levels. Performed at [facility], [address] [place], [place] [address]     Blood Alcohol level:  Lab Results  Component Value Date   ETH 255 (H) 07/19/2018   ETH 345 (H) 05/01/2013    Metabolic Disorder Labs: No results found for: "HGBA1C", "MPG" No results found for: "PROLACTIN" No results found for: "CHOL", "TRIG", "HDL", "CHOLHDL", "VLDL", "LDLCALC"  Therapeutic Lab Levels: No results found for: "LITHIUM" No results found for: "VALPROATE" No results found for: "CBMZ"  Physical Findings   PHQ2-9    Flowsheet Row ED from 11/15/2022 in [facility]  PHQ-2 Total Score 4  PHQ-9 Total Score 10      Flowsheet Row ED from 11/15/2022 in [facility] Most recent reading at 11/15/2022 10:24 PM ED from 11/15/2022 in [facility] Most recent reading at 11/15/2022  4:21 PM  C-SSRS RISK CATEGORY No Risk No Risk        Musculoskeletal  Strength & Muscle Tone: within normal limits Gait & Station: normal Patient leans: N/A  Psychiatric Specialty Exam  Presentation  General Appearance:  Appropriate for Environment;  Casual  Eye Contact: Fair  Speech: Clear and Coherent; Normal Rate  Speech Volume: Decreased  Handedness: Right   Mood and Affect  Mood: Anxious  Affect: Appropriate; Congruent   Thought Process  Thought Processes: Coherent; Goal Directed; Linear  Descriptions of Associations:Intact  Orientation: not assessed  Thought Content:Logical  Diagnosis of Schizophrenia or Schizoaffective disorder in past: No    Hallucinations:Hallucinations: None  Ideas of Reference:None  Suicidal Thoughts:Suicidal Thoughts: No  Homicidal Thoughts:Homicidal Thoughts: No   Sensorium  Memory: Immediate Good; Immediate Fair; Recent Good  Judgment: Good  Insight: Good   Executive Functions  Concentration: Good  Attention Span: Good  Recall: Good  Fund of Knowledge: Good  Language: Good   Psychomotor Activity  Psychomotor Activity:Psychomotor Activity: Normal   Assets  Assets: Communication Skills; Desire for Improvement; Physical Health; Resilience   Sleep  Sleep:Sleep: Good Number of Hours of Sleep: 7     Physical Exam  Physical Exam Vitals reviewed.  Constitutional:      General: He is not in acute distress.    Appearance: Normal appearance. He is not ill-appearing, toxic-appearing or diaphoretic.  HENT:     Head: Normocephalic and atraumatic.  Pulmonary:     Effort: Pulmonary effort is normal.  Neurological:     General: No focal deficit present.     Mental Status: He is alert.     Gait: Gait normal.    Review of Systems  Constitutional: Negative.   HENT: Negative.    Eyes: Negative.  Negative for blurred vision and double vision.  Cardiovascular: Negative.  Negative for chest pain and palpitations.  Gastrointestinal: Negative.  Negative for abdominal pain, heartburn, nausea and vomiting.  Skin: Negative.   Neurological: Negative.  Negative for dizziness, seizures, loss of consciousness, weakness and headaches.   Psychiatric/Behavioral:  Positive for substance abuse. Negative for depression, hallucinations and suicidal ideas. The patient is not nervous/anxious and does not have insomnia.    Blood pressure (!) 129/91, pulse 62, temperature 97.9 F (36.6 C), temperature source Oral, resp. rate 18, SpO2 100%. There is no height or weight  on file to calculate BMI.  Treatment Plan Summary: = Daily contact with patient to assess and evaluate symptoms and progress in treatment and Plan .  Per LCSW today, patient appropriate candidate for [facility] residential rehabilitation.  Withdrawal symptoms significantly improved, responding well to treatment.  Most recent CIWA score today is 1.    Alcohol Use Disorder - CIWA with as needed Ativan, and ativan taper (9/28-10/2) - Received short term gabapentin 200 mg twice daily (9/28-9/30) - Thiamine supplement 100 mg daily - multivitamin   Other PRN medications: Tylenol 650 mg every 6 hours as needed Maalox Mylanta every 4 hours as needed Milk of magnesia daily as needed     Lorri Frederick, MD 11/19/2022 3:20 PM  I personally was present and performed or re-performed the history, physical exam and medical decision-making activities of this service and have verified that the service and findings are accurately documented in the student's note.   Sign: Dr. Liston Alba, MD PGY-2, Psychiatry Residency

## 2022-11-19 NOTE — ED Notes (Addendum)
Pt sleeping in no acute distress. RR even and unlabored. Environment secured. Will continue to monitor for safety. 

## 2022-11-19 NOTE — Group Note (Signed)
Group Topic: Positive Affirmations  Group Date: 11/19/2022 Start Time: 1130 End Time: 1202 Facilitators: Vonzell Schlatter B  Department: [facility]  Number of Participants: 6  Group Focus: affirmation and daily focus Treatment Modality:  Psychoeducation Interventions utilized were problem solving Purpose: regain self-worth and relapse prevention strategies  Name: Chad Tanner Date of Birth: Apr 04, 1990  MR: [id number]    Level of Participation: active Quality of Participation: attentive and cooperative Interactions with others: gave feedback Mood/Affect: positive Triggers (if applicable): n/a Cognition: coherent/clear Progress: Moderate Response: n/a Plan: follow-up needed patient is waiting for next steps  Patients Problems:  Patient Active Problem List   Diagnosis Date Noted   Alcohol abuse 11/15/2022   Alcohol dependence (HCC) 05/01/2013   Depression 05/01/2013

## 2023-09-05 DIAGNOSIS — F1020 Alcohol dependence, uncomplicated: Secondary | ICD-10-CM | POA: Insufficient documentation

## 2023-09-06 ENCOUNTER — Other Ambulatory Visit (HOSPITAL_COMMUNITY)
Admission: EM | Admit: 2023-09-06 | Discharge: 2023-09-12 | Disposition: A | Discharge status: Home / Self Care | Source: Intra-hospital | Attending: Nurse Practitioner | Admitting: Nurse Practitioner

## 2023-09-06 ENCOUNTER — Ambulatory Visit (HOSPITAL_COMMUNITY)
Admission: EM | Admit: 2023-09-06 | Discharge: 2023-09-06 | Disposition: A | Discharge status: Other Acute Inpatient Hospital | Attending: Nurse Practitioner | Admitting: Nurse Practitioner

## 2023-09-06 DIAGNOSIS — F32A Depression, unspecified: Secondary | ICD-10-CM | POA: Diagnosis not present

## 2023-09-06 DIAGNOSIS — I10 Essential (primary) hypertension: Secondary | ICD-10-CM | POA: Insufficient documentation

## 2023-09-06 DIAGNOSIS — F1421 Cocaine dependence, in remission: Secondary | ICD-10-CM | POA: Insufficient documentation

## 2023-09-06 DIAGNOSIS — R9431 Abnormal electrocardiogram [ECG] [EKG]: Secondary | ICD-10-CM | POA: Insufficient documentation

## 2023-09-06 DIAGNOSIS — F1020 Alcohol dependence, uncomplicated: Secondary | ICD-10-CM

## 2023-09-06 DIAGNOSIS — F1094 Alcohol use, unspecified with alcohol-induced mood disorder: Secondary | ICD-10-CM

## 2023-09-06 DIAGNOSIS — F17210 Nicotine dependence, cigarettes, uncomplicated: Secondary | ICD-10-CM | POA: Diagnosis not present

## 2023-09-06 DIAGNOSIS — R519 Headache, unspecified: Secondary | ICD-10-CM | POA: Insufficient documentation

## 2023-09-06 DIAGNOSIS — Y906 Blood alcohol level of 120-199 mg/100 ml: Secondary | ICD-10-CM | POA: Insufficient documentation

## 2023-09-06 DIAGNOSIS — Z79899 Other long term (current) drug therapy: Secondary | ICD-10-CM | POA: Insufficient documentation

## 2023-09-06 DIAGNOSIS — Z560 Unemployment, unspecified: Secondary | ICD-10-CM | POA: Insufficient documentation

## 2023-09-06 DIAGNOSIS — G4700 Insomnia, unspecified: Secondary | ICD-10-CM | POA: Insufficient documentation

## 2023-09-06 DIAGNOSIS — F411 Generalized anxiety disorder: Secondary | ICD-10-CM | POA: Diagnosis not present

## 2023-09-06 DIAGNOSIS — Z6372 Alcoholism and drug addiction in family: Secondary | ICD-10-CM | POA: Insufficient documentation

## 2023-09-06 DIAGNOSIS — F1024 Alcohol dependence with alcohol-induced mood disorder: Secondary | ICD-10-CM | POA: Insufficient documentation

## 2023-09-06 DIAGNOSIS — F1090 Alcohol use, unspecified, uncomplicated: Secondary | ICD-10-CM | POA: Diagnosis present

## 2023-09-06 LAB — COMPREHENSIVE METABOLIC PANEL WITH GFR
ALT: 37 U/L (ref 0–44)
AST: 76 U/L — ABNORMAL HIGH (ref 15–41)
Albumin: 4.4 g/dL (ref 3.5–5.0)
Alkaline Phosphatase: 49 U/L (ref 38–126)
Anion gap: 15 (ref 5–15)
BUN: 8 mg/dL (ref 6–20)
CO2: 22 mmol/L (ref 22–32)
Calcium: 9.1 mg/dL (ref 8.9–10.3)
Chloride: 105 mmol/L (ref 98–111)
Creatinine, Ser: 1.03 mg/dL (ref 0.61–1.24)
GFR, Estimated: >60 mL/min (ref >60)
Glucose, Bld: 93 mg/dL (ref 70–99)
Potassium: 3.7 mmol/L (ref 3.5–5.1)
Sodium: 142 mmol/L (ref 135–145)
Total Bilirubin: 0.9 mg/dL (ref 0.0–1.2)
Total Protein: 7.3 g/dL (ref 6.5–8.1)

## 2023-09-06 LAB — CBC WITH DIFFERENTIAL/PLATELET
Abs Immature Granulocytes: 0.01 K/uL (ref 0.00–0.07)
Basophils Absolute: 0.1 K/uL (ref 0.0–0.1)
Basophils Relative: 1 %
Eosinophils Absolute: 0 K/uL (ref 0.0–0.5)
Eosinophils Relative: 0 %
HCT: 39.7 % (ref 39.0–52.0)
Hemoglobin: 13.9 g/dL (ref 13.0–17.0)
Immature Granulocytes: 0 %
Lymphocytes Relative: 34 %
Lymphs Abs: 2.6 K/uL (ref 0.7–4.0)
MCH: 31.2 pg (ref 26.0–34.0)
MCHC: 35 g/dL (ref 30.0–36.0)
MCV: 89 fL (ref 80.0–100.0)
Monocytes Absolute: 0.4 K/uL (ref 0.1–1.0)
Monocytes Relative: 5 %
Neutro Abs: 4.6 K/uL (ref 1.7–7.7)
Neutrophils Relative %: 60 %
Platelets: 198 K/uL (ref 150–400)
RBC: 4.46 MIL/uL (ref 4.22–5.81)
RDW: 14.2 % (ref 11.5–15.5)
WBC: 7.6 K/uL (ref 4.0–10.5)
nRBC: 0 % (ref 0.0–0.2)

## 2023-09-06 LAB — POCT URINE DRUG SCREEN - MANUAL ENTRY (I-SCREEN)
POC Amphetamine UR: NOT DETECTED
POC Buprenorphine (BUP): NOT DETECTED
POC Cocaine UR: NOT DETECTED
POC Marijuana UR: NOT DETECTED
POC Methadone UR: NOT DETECTED
POC Methamphetamine UR: NOT DETECTED
POC Morphine: NOT DETECTED
POC Oxazepam (BZO): NOT DETECTED
POC Oxycodone UR: NOT DETECTED
POC Secobarbital (BAR): NOT DETECTED

## 2023-09-06 LAB — MAGNESIUM: Magnesium: 1.9 mg/dL (ref 1.7–2.4)

## 2023-09-06 LAB — ETHANOL: Alcohol, Ethyl (B): 198 mg/dL — ABNORMAL HIGH (ref <15)

## 2023-09-06 MED ORDER — LORAZEPAM 2 MG/ML IJ SOLN: 2.0000 mg | Freq: Three times a day (TID) | INTRAMUSCULAR | Status: DC | PRN

## 2023-09-06 MED ORDER — LORAZEPAM 1 MG PO TABS: 1.0000 mg | ORAL_TABLET | Freq: Four times a day (QID) | ORAL | Status: DC

## 2023-09-06 MED ORDER — HALOPERIDOL 5 MG PO TABS: 5.0000 mg | ORAL_TABLET | Freq: Three times a day (TID) | ORAL | Status: DC | PRN

## 2023-09-06 MED ORDER — HALOPERIDOL LACTATE 5 MG/ML IJ SOLN: 10.0000 mg | Freq: Three times a day (TID) | INTRAMUSCULAR | Status: DC | PRN

## 2023-09-06 MED ORDER — LORAZEPAM 1 MG PO TABS: 1.0000 mg | ORAL_TABLET | Freq: Three times a day (TID) | ORAL | Status: DC

## 2023-09-06 MED ORDER — TRAZODONE HCL 50 MG PO TABS: 50.0000 mg | ORAL_TABLET | Freq: Every evening | ORAL | Status: DC | PRN

## 2023-09-06 MED ORDER — LOPERAMIDE HCL 2 MG PO CAPS: 2.0000 mg | ORAL_CAPSULE | ORAL | Status: AC | PRN

## 2023-09-06 MED ORDER — HALOPERIDOL LACTATE 5 MG/ML IJ SOLN: 5.0000 mg | Freq: Three times a day (TID) | INTRAMUSCULAR | Status: DC | PRN

## 2023-09-06 MED ORDER — ACETAMINOPHEN 325 MG PO TABS
650.0000 mg | ORAL_TABLET | Freq: Four times a day (QID) | ORAL | Status: DC | PRN
  Administered 2023-09-06 – 2023-09-11 (×2): 650 mg via ORAL
  Filled 2023-09-06 (×3): qty 2

## 2023-09-06 MED ORDER — THIAMINE MONONITRATE 100 MG PO TABS: 100.0000 mg | ORAL_TABLET | Freq: Every day | ORAL | Status: DC

## 2023-09-06 MED ORDER — LORAZEPAM 1 MG PO TABS: 1.0000 mg | ORAL_TABLET | Freq: Two times a day (BID) | ORAL | Status: DC

## 2023-09-06 MED ORDER — DIPHENHYDRAMINE HCL 50 MG/ML IJ SOLN: 50.0000 mg | Freq: Three times a day (TID) | INTRAMUSCULAR | Status: DC | PRN

## 2023-09-06 MED ORDER — LORAZEPAM 1 MG PO TABS
1.0000 mg | ORAL_TABLET | Freq: Four times a day (QID) | ORAL | Status: AC | PRN
  Administered 2023-09-07: 1 mg via ORAL

## 2023-09-06 MED ORDER — CLONIDINE HCL 0.1 MG PO TABS
0.1000 mg | ORAL_TABLET | Freq: Once | ORAL | Status: AC
  Administered 2023-09-06: 0.1 mg via ORAL
  Filled 2023-09-06: qty 1

## 2023-09-06 MED ORDER — LORAZEPAM 1 MG PO TABS
1.0000 mg | ORAL_TABLET | Freq: Three times a day (TID) | ORAL | Status: AC
  Administered 2023-09-08 (×3): 1 mg via ORAL
  Filled 2023-09-06 (×3): qty 1

## 2023-09-06 MED ORDER — ADULT MULTIVITAMIN W/MINERALS CH
1.0000 | ORAL_TABLET | Freq: Every day | ORAL | Status: DC
  Administered 2023-09-06 – 2023-09-12 (×7): 1 via ORAL
  Filled 2023-09-06 (×5): qty 1
  Filled 2023-09-06: qty 14
  Filled 2023-09-06 (×2): qty 1

## 2023-09-06 MED ORDER — LORAZEPAM 1 MG PO TABS: 1.0000 mg | ORAL_TABLET | Freq: Four times a day (QID) | ORAL | Status: DC | PRN

## 2023-09-06 MED ORDER — ALUM & MAG HYDROXIDE-SIMETH 200-200-20 MG/5ML PO SUSP: 30.0000 mL | ORAL | Status: DC | PRN

## 2023-09-06 MED ORDER — HYDROXYZINE HCL 25 MG PO TABS: 25.0000 mg | ORAL_TABLET | Freq: Four times a day (QID) | ORAL | Status: DC | PRN

## 2023-09-06 MED ORDER — THIAMINE HCL 100 MG/ML IJ SOLN
100.0000 mg | Freq: Once | INTRAMUSCULAR | Status: DC
  Filled 2023-09-06: qty 2

## 2023-09-06 MED ORDER — LOPERAMIDE HCL 2 MG PO CAPS: 2.0000 mg | ORAL_CAPSULE | ORAL | Status: DC | PRN

## 2023-09-06 MED ORDER — LORAZEPAM 1 MG PO TABS
1.0000 mg | ORAL_TABLET | Freq: Every day | ORAL | Status: AC
  Administered 2023-09-10: 1 mg via ORAL
  Filled 2023-09-06: qty 1

## 2023-09-06 MED ORDER — LORAZEPAM 1 MG PO TABS: 1.0000 mg | ORAL_TABLET | Freq: Every day | ORAL | Status: DC

## 2023-09-06 MED ORDER — DIPHENHYDRAMINE HCL 50 MG PO CAPS: 50.0000 mg | ORAL_CAPSULE | Freq: Three times a day (TID) | ORAL | Status: DC | PRN

## 2023-09-06 MED ORDER — ADULT MULTIVITAMIN W/MINERALS CH: 1.0000 | ORAL_TABLET | Freq: Every day | ORAL | Status: DC

## 2023-09-06 MED ORDER — TRAZODONE HCL 50 MG PO TABS
50.0000 mg | ORAL_TABLET | Freq: Every evening | ORAL | Status: DC | PRN
  Administered 2023-09-06 – 2023-09-10 (×4): 50 mg via ORAL
  Filled 2023-09-06 (×4): qty 1

## 2023-09-06 MED ORDER — MAGNESIUM HYDROXIDE 400 MG/5ML PO SUSP: 30.0000 mL | Freq: Every day | ORAL | Status: DC | PRN

## 2023-09-06 MED ORDER — ONDANSETRON 4 MG PO TBDP: 4.0000 mg | ORAL_TABLET | Freq: Four times a day (QID) | ORAL | Status: AC | PRN

## 2023-09-06 MED ORDER — ONDANSETRON 4 MG PO TBDP: 4.0000 mg | ORAL_TABLET | Freq: Four times a day (QID) | ORAL | Status: DC | PRN

## 2023-09-06 MED ORDER — DIPHENHYDRAMINE HCL 50 MG PO CAPS
50.0000 mg | ORAL_CAPSULE | Freq: Three times a day (TID) | ORAL | Status: DC | PRN
  Administered 2023-09-08: 50 mg via ORAL
  Filled 2023-09-06: qty 1

## 2023-09-06 MED ORDER — LORAZEPAM 1 MG PO TABS
1.0000 mg | ORAL_TABLET | Freq: Two times a day (BID) | ORAL | Status: AC
  Administered 2023-09-09: 1 mg via ORAL
  Filled 2023-09-06 (×2): qty 1

## 2023-09-06 MED ORDER — ACETAMINOPHEN 325 MG PO TABS: 650.0000 mg | ORAL_TABLET | Freq: Four times a day (QID) | ORAL | Status: DC | PRN

## 2023-09-06 MED ORDER — HYDROXYZINE HCL 25 MG PO TABS: 25.0000 mg | ORAL_TABLET | Freq: Four times a day (QID) | ORAL | Status: AC | PRN

## 2023-09-06 MED ORDER — LORAZEPAM 1 MG PO TABS
1.0000 mg | ORAL_TABLET | Freq: Four times a day (QID) | ORAL | Status: AC
  Administered 2023-09-06 – 2023-09-07 (×6): 1 mg via ORAL
  Filled 2023-09-06 (×6): qty 1

## 2023-09-06 MED ORDER — AMLODIPINE BESYLATE 5 MG PO TABS
5.0000 mg | ORAL_TABLET | ORAL | Status: AC
  Administered 2023-09-06: 5 mg via ORAL
  Filled 2023-09-06: qty 1

## 2023-09-06 MED ORDER — THIAMINE MONONITRATE 100 MG PO TABS
100.0000 mg | ORAL_TABLET | Freq: Every day | ORAL | Status: DC
  Administered 2023-09-07 – 2023-09-12 (×6): 100 mg via ORAL
  Filled 2023-09-06: qty 1
  Filled 2023-09-06: qty 14
  Filled 2023-09-06 (×5): qty 1

## 2023-09-06 MED ORDER — CLONIDINE HCL 0.1 MG PO TABS
0.1000 mg | ORAL_TABLET | Freq: Two times a day (BID) | ORAL | Status: DC | PRN
  Administered 2023-09-06 – 2023-09-10 (×2): 0.1 mg via ORAL
  Filled 2023-09-06 (×2): qty 1

## 2023-09-06 NOTE — Group Note (Signed)
 Group Topic: Communication  Group Date: 09/06/2023 Start Time: 0800 End Time: 0830 Facilitators: Nena Lesley PARAS, NT  Department: [facility]  Number of Participants: 4  Group Focus: check in, community group, daily focus, feeling awareness/expression, and goals/reality orientation Treatment Modality:  Psychoeducation Interventions utilized were clarification, exploration, and patient education Purpose: increase insight  Name: Chad Tanner Date of Birth: 1990/07/21  MR: [id number]    Level of Participation: PT did not attend group. Quality of Participation: N/A Interactions with others: N/A Mood/Affect: N/A Triggers (if applicable): N/A Cognition: N/A Progress: None Response: PT was admitted to [facility] during the early morning hours on this date. PT explained he was tired and was observed shaking and with a tired appearance. PT did not attend group. Plan: patient will be encouraged to come to groups once he is feeling better physically  Patients Problems:  Patient Active Problem List   Diagnosis Date Noted   Alcohol use disorder 09/06/2023   Alcohol abuse 11/15/2022   Alcohol dependence (HCC) 05/01/2013   Depression 05/01/2013

## 2023-09-06 NOTE — ED Notes (Signed)
 Pt presents to [facility] from [facility], requesting substance abuse treatment. Pt reports that he relapsed and has been drinking more heavily. Pt has been drinking daily for a few years now. Pt reports past treatment and was admitted to [facility] in 2024, but did not accept recommended care after discharge. Pt states  I thought was strong enough to go back home, but I should have stayed and finished treatment. Pt encouraged to stay and complete program to achieve results.  Pt denies psychiatric history, prior pyschiatric hospitalizations and self-injurious behaviors. Pt currently denies SI,HI,AVH. Admission process completed. Pt is oriented to the unit and provided with meal. Facility routine checks in place.
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
## 2023-09-06 NOTE — ED Notes (Signed)
 Patient is sleeping. Respirations equal and unlabored, skin warm and dry. No change in assessment or acuity. Routine safety checks conducted according to facility protocol.

## 2023-09-06 NOTE — ED Notes (Signed)
 Patient currently sleeping and resting in recliner. RR even and unlabored, appearing in no noted distress. Environmental check complete
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
## 2023-09-06 NOTE — ED Notes (Signed)
 Pt has generally isolated in room entire shift thus far, declined breakfast sand lunch. Pt provided with crakers and ginger ale. Pt denies nausea, just lack of appetite. Pt reports feeling like he is experiencing 'a bad hangover'. Pt generally cooperative and compliant with all scheduled medications.

## 2023-09-06 NOTE — ED Notes (Signed)
 Pt declined dinner. Pt currently watching TV in dayroom with other pts and drinking water. Provider was notified about elevated BP. Pt instructed to notify staff if he develops headache or other discomforts.

## 2023-09-06 NOTE — ED Provider Notes (Signed)
 Facility Based Crisis Admission H&P  Date: 09/06/23 Patient Name: Chad Tanner MRN: [id number] Chief Complaint: worsening cocaine and alcohol use.   Diagnoses:  Final diagnoses:  Alcohol use, unspecified with alcohol-induced mood disorder (HCC)  GAD (generalized anxiety disorder)   HPI: Chad Tanner is a 33 y.o. male with a history of cocaine dependence, as well as alcohol dependence who presented to the [facility] on 07/19 for worsening alcohol dependence, & seeking detoxification from the substances, in an effort to regain his sobriety and quality of life.  Patient was hospitalized at a facility based crisis of this facility in an effort to assist him with his goal as listed above.  Patient assessment & review of psychiatry symptoms: During encounter with patient, mood is melancholic, patient speaks to Clinical research associate with his back Furniture conservator/restorer, and face towards the wall, and blanket over his head.  He reports persistently depressed and worsening mood prior to presenting for this hospitalization.  He reports drinking 1/5 of vodka daily for the last couple of years, reports that his last drink was 4 to 5 PM yesterday prior to his presentation to this facility.  He reports a history of multiple blackouts related to alcohol use, denies a history of seizures related to alcohol use, denies a history of DTs.  Patient denies any other substance use such as THC, cocaine. Reports nicotine  use, states that he smokes 1 pack/day of cigarettes. He denies prescription medication abuse and denies using any other substances not mentioned above. States that he would like to pursue the option of going to rehab after his stay at this facility, states that he will like for CSW to assist with this goal. Education provided on the need to speak with CSW on Monday, and verbalizes understanding. UDS was negative for substances of abuse, and BAL was 198 on 07/19.   Patient reports worsening  depressive symptoms consisting of insomnia, reports that he has been drinking alcohol to self-medicate in order to get to sleep nightly.  He reports anhedonia, reports that he typically enjoys walking, but has not been doing so lately.  Reports decreased energy levels which have been persistently low for at least the past 2 weeks.  Reports poor appetite, reports mental clouding, reports feelings of hopelessness, helplessness, worthlessness, for at least the last few months.  He however denies suicidal ideations, denies any suicide attempts in the past, denies any mental health-related hospitalizations in the past, denies any psychotropic medication trials in the past.  As per chart review, information provided here by patient is not accurate, as documentation shows that he has been deceptive.  Patient has had a history of multiple ER presentations for mental health reasons, and has also been admitted to the facility based crisis here before as follows: 11/15/2022, and revealed that he had been charged with a DUI during that encounter, with a court date in October 2024.  Patient is dissected yet again, and denies today that he has a DUI or has any legal issues related to his drinking.  During that encounter, patient had reported that he was drinking a gallon of vodka per day, and had been seeking placement at [facility] residential treatment during that encounter, but apparently was unable to proceed with going there due to insurance issues.  Patient left AMA on 10/01 after that stay.  Patient reports history of his depression progress relationship with ex-girlfriend recently, reports financial stressors, reports currently having no job, states that the last time he  worked was 3 months ago at [employer].  He reports that he was residing with his girlfriend prior to presenting to this facility, but states that if discharged, he can return to his mother's house. He reports that he  Patient denies symptoms  significant for other mental health conditions, denies symptoms significant for bipolar 1/2 disorders.  Denies symptoms consistent for PTSD, denies emotional, physical, or sexual trauma as a child or as an adult. Denies self injurious behaviors. Denies aggression towards other people or animals, denies fire setting behaviors. Denies medical conditions, denies head injuries, denies history of seizures. Denies access to firearms. Reports that he quit his job three months ago related to transportation issues where his GF would not let him use vehicle to go to work. States that GF has history of alcohol use disorder as well.  [place] Suicide Risk assessment:  1. Do you wish to be dead? NO 2. Have you wished your dead or wished you could go to sleep and not wake up? NONE 3.  Have you actually had thoughts of killing yourself?  NO 4.  Have you been thinking about how you might do this?  NO 5.  Have you had these thoughts and some intention of acting on them? NO 6.  Have you started to work out or worked out the details to kill yourself? NO 7.  Do you intend to carry out this plan? N/a 8. On a scale of 1-5 with 1 being the least severe and 5 being the most severe answer the following questions place for intensity of ideation. ZERO 9. How many times have you had these thoughts? NONE REPORTED 10. When you have the thoughts how long to the last?  NONE REPORTED 11. Control ability.  Could you or can you stop thinking about killing herself or wanting to die if you want to?  YES 12. Are there any things anyone or anything family religion pain of death that stop you from wanting to die or acting on thoughts of committing suicide?  FAMILY 13.  What sort of reason to do have to think about wanting to die or killing yourself? NONE REPORTED 14.Was it to end the pain or stop the way you are feeling in other words you could not go on living with his pain or how you are feeling or was not to get attention revenge or  reaction from others?  Or both?  NONE REPORTED   PHQ 2-9:  Flowsheet Row ED from 09/06/2023 in [facility] ED from 11/15/2022 in [facility]  Thoughts that you would be better off dead, or of hurting yourself in some way Several days Not at all  PHQ-9 Total Score 9 10    Flowsheet Row ED from 09/06/2023 in [facility] Most recent reading at 09/06/2023  6:36 AM ED from 09/06/2023 in [facility] Most recent reading at 09/06/2023  2:32 AM ED from 11/15/2022 in [facility] Most recent reading at 11/15/2022 10:24 PM  C-SSRS RISK CATEGORY No Risk No Risk No Risk    Screenings    Flowsheet Row Most Recent Value  CIWA-Ar Total 6    Total Time spent with patient: 1.5 hours  Musculoskeletal  Strength & Muscle Tone: within normal limits Gait & Station: normal Patient leans: N/A  Psychiatric Specialty Exam  Presentation General Appearance:  Disheveled  Eye Contact: Fair  Speech: Clear and Coherent  Speech  Volume: Normal  Handedness: Right   Mood and Affect  Mood: Depressed; Anxious  Affect: Congruent   Thought Process  Thought Processes: Coherent  Descriptions of Associations:Intact  Orientation:Full (Time, Place and Person)  Thought Content:Logical  Diagnosis of Schizophrenia or Schizoaffective disorder in past: No   Hallucinations:Hallucinations: None  Ideas of Reference:None  Suicidal Thoughts:Suicidal Thoughts: No  Homicidal Thoughts:Homicidal Thoughts: No   Sensorium  Memory: Immediate Fair  Judgment: Fair  Insight: Fair   Art therapist  Concentration: Fair  Attention Span: Fair  Recall: Fair  Fund of Knowledge: Fair  Language: Fair   Psychomotor Activity  Psychomotor Activity: Psychomotor Activity: Normal   Assets  Assets: Desire for Improvement   Sleep  Sleep: Sleep:  Fair Number of Hours of Sleep: 6   Nutritional Assessment (For OBS and FBC admissions only) Has the patient had a weight loss or gain of 10 pounds or more in the last 3 months?: No Has the patient had a decrease in food intake/or appetite?: No Does the patient have dental problems?: No Does the patient have eating habits or behaviors that may be indicators of an eating disorder including binging or inducing vomiting?: No Has the patient recently lost weight without trying?: 0 Has the patient been eating poorly because of a decreased appetite?: 1 Malnutrition Screening Tool Score: 1   Physical Exam Vitals reviewed.    Review of Systems  Psychiatric/Behavioral:  Positive for depression and substance abuse. Negative for hallucinations, memory loss and suicidal ideas. The patient is nervous/anxious and has insomnia.   All other systems reviewed and are negative.   Blood pressure (!) 140/122, pulse (!) 114, resp. rate 16, SpO2 100%. There is no height or weight on file to calculate BMI.-Norvasc  5 mg and Clonidine  0.1 mg ordered, pt asymptomatic. Placed order for repeat EKG with instructions for the night shift provider to be notified of results due to patient with history of EKG showing stemi.  Is the patient at risk to self? No  Has the patient been a risk to self in the past 6 months? No .    Has the patient been a risk to self within the distant past? No   Is the patient a risk to others? No   Has the patient been a risk to others in the past 6 months? No   Has the patient been a risk to others within the distant past? No   Past Medical History: denies  Family History: denies Social History: denies  Last Labs:  Admission on 09/06/2023, Discharged on 09/06/2023  Component Date Value Ref Range Status   WBC 09/06/2023 7.6  4.0 - 10.5 K/uL Final   RBC 09/06/2023 4.46  4.22 - 5.81 MIL/uL Final   Hemoglobin 09/06/2023 13.9  13.0 - 17.0 g/dL Final   HCT 92/80/7974 39.7  39.0 - 52.0 %  Final   MCV 09/06/2023 89.0  80.0 - 100.0 fL Final   MCH 09/06/2023 31.2  26.0 - 34.0 pg Final   MCHC 09/06/2023 35.0  30.0 - 36.0 g/dL Final   RDW 92/80/7974 14.2  11.5 - 15.5 % Final   Platelets 09/06/2023 198  150 - 400 K/uL Final   nRBC 09/06/2023 0.0  0.0 - 0.2 % Final   Neutrophils Relative % 09/06/2023 60  % Final   Neutro Abs 09/06/2023 4.6  1.7 - 7.7 K/uL Final   Lymphocytes Relative 09/06/2023 34  % Final   Lymphs Abs 09/06/2023 2.6  0.7 - 4.0 K/uL Final  Monocytes Relative 09/06/2023 5  % Final   Monocytes Absolute 09/06/2023 0.4  0.1 - 1.0 K/uL Final   Eosinophils Relative 09/06/2023 0  % Final   Eosinophils Absolute 09/06/2023 0.0  0.0 - 0.5 K/uL Final   Basophils Relative 09/06/2023 1  % Final   Basophils Absolute 09/06/2023 0.1  0.0 - 0.1 K/uL Final   Immature Granulocytes 09/06/2023 0  % Final   Abs Immature Granulocytes 09/06/2023 0.01  0.00 - 0.07 K/uL Final   Performed at [facility], [address] [place], [place] [address]   Sodium 09/06/2023 142  135 - 145 mmol/L Final   Potassium 09/06/2023 3.7  3.5 - 5.1 mmol/L Final   Chloride 09/06/2023 105  98 - 111 mmol/L Final   CO2 09/06/2023 22  22 - 32 mmol/L Final   Glucose, Bld 09/06/2023 93  70 - 99 mg/dL Final   Glucose reference range applies only to samples taken after fasting for at least 8 hours.   BUN 09/06/2023 8  6 - 20 mg/dL Final   Creatinine, Ser 09/06/2023 1.03  0.61 - 1.24 mg/dL Final   Calcium 92/80/7974 9.1  8.9 - 10.3 mg/dL Final   Total Protein 92/80/7974 7.3  6.5 - 8.1 g/dL Final   Albumin 92/80/7974 4.4  3.5 - 5.0 g/dL Final   AST 92/80/7974 76 (H)  15 - 41 U/L Final   ALT 09/06/2023 37  0 - 44 U/L Final   Alkaline Phosphatase 09/06/2023 49  38 - 126 U/L Final   Total Bilirubin 09/06/2023 0.9  0.0 - 1.2 mg/dL Final   GFR, Estimated 09/06/2023 >60  >60 mL/min Final   Comment: (NOTE) Calculated using the CKD-EPI Creatinine Equation (2021)    Anion gap 09/06/2023 15  5 - 15 Final    Performed at [facility], [address] [place], [place] [address]   Alcohol, Ethyl (B) 09/06/2023 198 (H)  <15 mg/dL Final   Comment: (NOTE) For medical purposes only. Performed at [facility], [address] [place], [place] [address]    POC Amphetamine UR 09/06/2023 None Detected  NONE DETECTED (Cut Off Level 1000 ng/mL) Final   POC Secobarbital (BAR) 09/06/2023 None Detected  NONE DETECTED (Cut Off Level 300 ng/mL) Final   POC Buprenorphine (BUP) 09/06/2023 None Detected  NONE DETECTED (Cut Off Level 10 ng/mL) Final   POC Oxazepam (BZO) 09/06/2023 None Detected  NONE DETECTED (Cut Off Level 300 ng/mL) Final   POC Cocaine UR 09/06/2023 None Detected  NONE DETECTED (Cut Off Level 300 ng/mL) Final   POC Methamphetamine UR 09/06/2023 None Detected  NONE DETECTED (Cut Off Level 1000 ng/mL) Final   POC Morphine 09/06/2023 None Detected  NONE DETECTED (Cut Off Level 300 ng/mL) Final   POC Methadone UR 09/06/2023 None Detected  NONE DETECTED (Cut Off Level 300 ng/mL) Final   POC Oxycodone  UR 09/06/2023 None Detected  NONE DETECTED (Cut Off Level 100 ng/mL) Final   POC Marijuana UR 09/06/2023 None Detected  NONE DETECTED (Cut Off Level 50 ng/mL) Final   Magnesium  09/06/2023 1.9  1.7 - 2.4 mg/dL Final   Performed at [facility], [address], [place], [place] [address]    Allergies: Patient has no known allergies.  Medications:  Facility Ordered Medications  Medication   acetaminophen  (TYLENOL ) tablet 650 mg   alum & mag hydroxide-simeth (MAALOX/MYLANTA) 200-200-20 MG/5ML suspension 30 mL   magnesium  hydroxide (MILK OF MAGNESIA) suspension 30 mL  haloperidol  (HALDOL ) tablet 5 mg   And   diphenhydrAMINE  (BENADRYL ) capsule 50 mg   haloperidol  lactate (HALDOL ) injection 5 mg   And   diphenhydrAMINE  (BENADRYL ) injection 50 mg   And   LORazepam  (ATIVAN ) injection 2 mg   haloperidol  lactate (HALDOL ) injection 10 mg   And   diphenhydrAMINE  (BENADRYL ) injection 50  mg   And   LORazepam  (ATIVAN ) injection 2 mg   traZODone  (DESYREL ) tablet 50 mg   [START ON 09/07/2023] thiamine  (VITAMIN B1) tablet 100 mg   multivitamin with minerals tablet 1 tablet   LORazepam  (ATIVAN ) tablet 1 mg   hydrOXYzine  (ATARAX ) tablet 25 mg   loperamide  (IMODIUM ) capsule 2-4 mg   ondansetron  (ZOFRAN -ODT) disintegrating tablet 4 mg   LORazepam  (ATIVAN ) tablet 1 mg   Followed by   NOREEN ON 09/07/2023] LORazepam  (ATIVAN ) tablet 1 mg   Followed by   NOREEN ON 09/08/2023] LORazepam  (ATIVAN ) tablet 1 mg   Followed by   NOREEN ON 09/10/2023] LORazepam  (ATIVAN ) tablet 1 mg   [COMPLETED] cloNIDine  (CATAPRES ) tablet 0.1 mg   [COMPLETED] cloNIDine  (CATAPRES ) tablet 0.1 mg   cloNIDine  (CATAPRES ) tablet 0.1 mg   [COMPLETED] amLODipine  (NORVASC ) tablet 5 mg    Long Term Goals: Improvement in symptoms so as ready for discharge  Short Term Goals: Patient will verbalize feelings in meetings with treatment team members., Patient will attend at least of 50% of the groups daily., Pt will complete the PHQ9 on admission, day 3 and discharge., Patient will participate in completing the [place] Suicide Severity Rating Scale, Patient will score a low risk of violence for 24 hours prior to discharge, and Patient will take medications as prescribed daily.  Medical Decision Making  -Continue FBC admission - Continue Ativan  taper-CD medication administration record for details -Continue Multivitamins/thiamine  replacements as per MAR - Continue trazodone  50 mg nightly as needed for sleep -Continue as needed 50 mg hydroxyzine  every 6 hours for anxiety -Start clonidine  0.1 mg as needed twice daily for SBP although 160 or DBP over 100 -Start Norvasc  5 mg daily for hypertension -Give one-time dose of Norvasc  5 mg & 1 time dose of clonidine  0.1 mg   Recommendations  Based on my evaluation the patient appears to have an emergency mental health condition for which I recommend the patient be kept  iat the  Facility Based Treatment center here at the [facility] for treatment and stabilization.  -Monitor for signs of withdrawal -Continue CIWA Scores as per protocol - Oral thiamine  and MVI replacement - Abstinence from substances encouraged  - SW to look into options for outpatient SA treatment at discharge  -Q15 minute checks as per unit protocol  Donia Snell, NP 09/06/23  8:01 PM

## 2023-09-06 NOTE — ED Notes (Signed)
 Pt reports to not feel well, including headache. Elevated BP reported to provider.

## 2023-09-06 NOTE — ED Notes (Signed)
 Patient was anxious and cooperative during initial nurse shift assessment. Patient to be transferred to [facility] once labs result per provider Roxianne, NP order. Patient denies any SI, HI, AVH. Patient denies any pain. Patient refused Vitamin B1 IM 100 mg. Patient education on the medication. Patient refused medication after education.   RR even and unlabored, appearing in no noted distress. Environmental check complete.

## 2023-09-06 NOTE — ED Notes (Signed)
 Pt denies si hi, verbal contract for safety provided. Pt denied appetite, denies s/s nausea. Pt administered prn tylenol  for 7/10 headache. Pt has visual tremors, pt reports they are due to 'drinking'. Pt denies hx of seizures. Pt currently resting in room.

## 2023-09-06 NOTE — ED Provider Notes (Signed)
 [facility] Continuous Assessment Admission H&P  Date: 09/06/23 Patient Name: Chad Tanner MRN: [id number] Chief Complaint: I want to stop drinking  Diagnoses:  Final diagnoses:  Alcohol use disorder, severe, dependence (HCC)    HPI: Chad Tanner is a 33 y/o male with a history alcohol abuse presented voluntarily to [facility] as a walk in accompanied with complaints of worsening alcohol abuse.   Chad Tanner, 33 y.o., male patient seen face to face by this provider and chart reviewed on 09/06/23.  On evaluation Chad Tanner reports that he has been drinking 12-24 beers daily and 2 pints of vodka daily.  Patient reports he started drinking at age 60 and that everyone that he is around drinks excessively.  Patient reports that he lives with his girlfriend and she drinks heavily as well and he broke up with her today.  Patient states that he knew if he stayed in the relationship with his girlfriend he was going to continue to drink.  Patient reports that his girlfriend is very verbally disrespectful towards him.  Patient acknowledges that  drinking is a coping mechanism for him.  Patient endorses having racing thoughts, anhedonia, sadness and depression symptoms.  Patient endorses poor sleep and poor appetite.  Patient denies any other illicit substances.  Patient denies any SI HI or AVH.  Patient denies any history of seizures.  Patient denies any outpatient mental health services or therapy.  Patient denies any current psychiatric medications.  During evaluation Chad Tanner is sitting in no acute distress. He is alert, oriented x 4, calm, cooperative and attentive. His mood is euthymic with congruent affect. He has normal speech, and behavior.  Objectively there is no evidence of psychosis/mania or delusional thinking.  Patient is able to converse coherently, goal directed thoughts, no distractibility, or pre-occupation. He also denies suicidal/self-harm/homicidal ideation,  psychosis, and paranoia.  Patient answered question appropriately.     Patient is requesting inpatient alcohol detox treatment. Patient will be admitted to [facility]anner until labs result and then patient will transition to [facility] to safely detox from alcohol.  Total Time spent with patient: 15 minutes  Musculoskeletal  Strength & Muscle Tone: within normal limits Gait & Station: normal Patient leans: N/A  Psychiatric Specialty Exam  Presentation General Appearance:  Disheveled  Eye Contact: Fair  Speech: Clear and Coherent  Speech Volume: Normal  Handedness: Right   Mood and Affect  Mood: Depressed  Affect: Appropriate   Thought Process  Thought Processes: Coherent; Goal Directed  Descriptions of Associations:Intact  Orientation:Full (Time, Place and Person)  Thought Content:Logical  Diagnosis of Schizophrenia or Schizoaffective disorder in past: No   Hallucinations:Hallucinations: None  Ideas of Reference:None  Suicidal Thoughts:Suicidal Thoughts: No  Homicidal Thoughts:Homicidal Thoughts: No   Sensorium  Memory: Immediate Good; Recent Good; Remote Good  Judgment: Fair  Insight: Fair   Chartered certified accountant: Fair  Attention Span: Fair  Recall: [employer] of Knowledge: Fair  Language: Fair   Psychomotor Activity  Psychomotor Activity: Psychomotor Activity: Normal   Assets  Assets: Communication Skills; Desire for Improvement; Housing; Resilience   Sleep  Sleep: Sleep: Fair Number of Hours of Sleep: 6   Nutritional Assessment (For OBS and [facility]BC admissions only) Has the patient had a weight loss or gain of 10 pounds or more in the last 3 months?: No Has the patient had a decrease in food intake/or appetite?: No Does the patient have dental problems?: No Does  the patient have eating habits or behaviors that may be indicators of an eating disorder including binging or inducing vomiting?:  No Has the patient recently lost weight without trying?: 0 Has the patient been eating poorly because of a decreased appetite?: 1 Malnutrition Screening Tool Score: 1    Physical Exam HENT:     Head: Normocephalic.     Nose: Nose normal.  Eyes:     Pupils: Pupils are equal, round, and reactive to light.  Cardiovascular:     Rate and Rhythm: Normal rate.  Pulmonary:     Effort: Pulmonary effort is normal.  Abdominal:     General: Abdomen is flat.  Musculoskeletal:        General: Normal range of motion.     Cervical back: Normal range of motion.  Skin:    General: Skin is warm.  Neurological:     Mental Status: He is alert and oriented to person, place, and time.  Psychiatric:        Attention and Perception: Attention normal.        Mood and Affect: Mood is anxious and depressed.        Speech: Speech normal.        Behavior: Behavior is cooperative.        Thought Content: Thought content is not paranoid or delusional. Thought content does not include homicidal or suicidal ideation. Thought content does not include homicidal or suicidal plan.        Cognition and Memory: Cognition normal.        Judgment: Judgment is impulsive.    Review of Systems  Constitutional: Negative.   HENT: Negative.    Eyes: Negative.   Respiratory: Negative.    Cardiovascular: Negative.   Gastrointestinal: Negative.   Genitourinary: Negative.   Musculoskeletal: Negative.   Skin: Negative.   Neurological: Negative.   Endo/Heme/Allergies: Negative.   Psychiatric/Behavioral:  Positive for substance abuse.     Blood pressure (!) 147/96, pulse 100, temperature 98.7 F (37.1 C), temperature source Oral, resp. rate 20, SpO2 100%. There is no height or weight on file to calculate BMI.  Past Psychiatric History: Alcohol abuse  Is the patient at risk to self? No  Has the patient been a risk to self in the past 6 months? No .    Has the patient been a risk to self within the distant past? No    Is the patient a risk to others? No   Has the patient been a risk to others in the past 6 months? No   Has the patient been a risk to others within the distant past? No   Past Medical History: No significant medical history  Family History:  maternal uncles and aunts-alcoholism  Social History: 33 y/o male unemployed lives at home with his mother  Last Labs:  No visits with results within 6 Month(s) from this visit.  Latest known visit with results is:  Admission on 11/15/2022, Discharged on 11/19/2022  Component Date Value Ref Range Status   POC Amphetamine UR 11/16/2022 None Detected  NONE DETECTED (Cut Off Level 1000 ng/mL) Final   POC Secobarbital (BAR) 11/16/2022 None Detected  NONE DETECTED (Cut Off Level 300 ng/mL) Final   POC Buprenorphine (BUP) 11/16/2022 None Detected  NONE DETECTED (Cut Off Level 10 ng/mL) Final   POC Oxazepam (BZO) 11/16/2022 Positive (A)  NONE DETECTED (Cut Off Level 300 ng/mL) Final   POC Cocaine UR 11/16/2022 None Detected  NONE DETECTED (Cut Off  Level 300 ng/mL) Final   POC Methamphetamine UR 11/16/2022 None Detected  NONE DETECTED (Cut Off Level 1000 ng/mL) Final   POC Morphine 11/16/2022 None Detected  NONE DETECTED (Cut Off Level 300 ng/mL) Final   POC Methadone UR 11/16/2022 None Detected  NONE DETECTED (Cut Off Level 300 ng/mL) Final   POC Oxycodone  UR 11/16/2022 None Detected  NONE DETECTED (Cut Off Level 100 ng/mL) Final   POC Marijuana UR 11/16/2022 None Detected  NONE DETECTED (Cut Off Level 50 ng/mL) Final   WBC 11/16/2022 5.1  4.0 - 10.5 K/uL Final   RBC 11/16/2022 4.93  4.22 - 5.81 MIL/uL Final   Hemoglobin 11/16/2022 15.1  13.0 - 17.0 g/dL Final   HCT 90/71/7975 43.3  39.0 - 52.0 % Final   MCV 11/16/2022 87.8  80.0 - 100.0 fL Final   MCH 11/16/2022 30.6  26.0 - 34.0 pg Final   MCHC 11/16/2022 34.9  30.0 - 36.0 g/dL Final   RDW 90/71/7975 16.7 (H)  11.5 - 15.5 % Final   Platelets 11/16/2022 278  150 - 400 K/uL Final   nRBC 11/16/2022  0.0  0.0 - 0.2 % Final   Neutrophils Relative % 11/16/2022 57  % Final   Neutro Abs 11/16/2022 2.9  1.7 - 7.7 K/uL Final   Lymphocytes Relative 11/16/2022 33  % Final   Lymphs Abs 11/16/2022 1.7  0.7 - 4.0 K/uL Final   Monocytes Relative 11/16/2022 8  % Final   Monocytes Absolute 11/16/2022 0.4  0.1 - 1.0 K/uL Final   Eosinophils Relative 11/16/2022 1  % Final   Eosinophils Absolute 11/16/2022 0.0  0.0 - 0.5 K/uL Final   Basophils Relative 11/16/2022 1  % Final   Basophils Absolute 11/16/2022 0.0  0.0 - 0.1 K/uL Final   Immature Granulocytes 11/16/2022 0  % Final   Abs Immature Granulocytes 11/16/2022 0.02  0.00 - 0.07 K/uL Final   Performed at [facility], [address], [place], [place] [address]   Sodium 11/16/2022 137  135 - 145 mmol/L Final   Potassium 11/16/2022 4.1  3.5 - 5.1 mmol/L Final   Chloride 11/16/2022 100  98 - 111 mmol/L Final   CO2 11/16/2022 27  22 - 32 mmol/L Final   Glucose, Bld 11/16/2022 97  70 - 99 mg/dL Final   Glucose reference range applies only to samples taken after fasting for at least 8 hours.   BUN 11/16/2022 10  6 - 20 mg/dL Final   Creatinine, Ser 11/16/2022 1.13  0.61 - 1.24 mg/dL Final   Calcium 90/71/7975 9.1  8.9 - 10.3 mg/dL Final   Total Protein 90/71/7975 6.8  6.5 - 8.1 g/dL Final   Albumin 90/71/7975 3.7  3.5 - 5.0 g/dL Final   AST 90/71/7975 32  15 - 41 U/L Final   ALT 11/16/2022 16  0 - 44 U/L Final   Alkaline Phosphatase 11/16/2022 51  38 - 126 U/L Final   Total Bilirubin 11/16/2022 1.3 (H)  0.3 - 1.2 mg/dL Final   GFR, Estimated 11/16/2022 >60  >60 mL/min Final   Comment: (NOTE) Calculated using the CKD-EPI Creatinine Equation (2021)    Anion gap 11/16/2022 10  5 - 15 Final   Performed at [facility], [address] [place], [place] [address]   TSH 11/16/2022 1.599  0.350 - 4.500 uIU/mL Final   Comment: Performed by a 3rd Generation assay with a functional sensitivity of <=0.01 uIU/mL. Performed at [facility], [address], [place], [place] [address]    Vitamin B-12 11/16/2022 322  180 - 914 pg/mL Final   Comment: (NOTE) This assay is not validated for testing neonatal or myeloproliferative syndrome specimens for Vitamin B12 levels. Performed at [facility], [address], [place], [place] [address]     Allergies: Patient has no known allergies.  Medications:  Facility Ordered Medications  Medication   acetaminophen  (TYLENOL ) tablet 650 mg   alum & mag hydroxide-simeth (MAALOX/MYLANTA) 200-200-20 MG/5ML suspension 30 mL   magnesium  hydroxide (MILK OF MAGNESIA) suspension 30 mL   haloperidol  (HALDOL ) tablet 5 mg   And   diphenhydrAMINE  (BENADRYL ) capsule 50 mg   haloperidol  lactate (HALDOL ) injection 5 mg   And   diphenhydrAMINE  (BENADRYL ) injection 50 mg   And   LORazepam  (ATIVAN ) injection 2 mg   haloperidol  lactate (HALDOL ) injection 10 mg   And   diphenhydrAMINE  (BENADRYL ) injection 50 mg   And   LORazepam  (ATIVAN ) injection 2 mg   traZODone  (DESYREL ) tablet 50 mg   thiamine  (VITAMIN B1) injection 100 mg   [START ON 09/07/2023] thiamine  (VITAMIN B1) tablet 100 mg   multivitamin with minerals tablet 1 tablet   LORazepam  (ATIVAN ) tablet 1 mg   hydrOXYzine  (ATARAX ) tablet 25 mg   loperamide  (IMODIUM ) capsule 2-4 mg   ondansetron  (ZOFRAN -ODT) disintegrating tablet 4 mg   LORazepam  (ATIVAN ) tablet 1 mg   Followed by   NOREEN ON 09/07/2023] LORazepam  (ATIVAN ) tablet 1 mg   Followed by   NOREEN ON 09/08/2023] LORazepam  (ATIVAN ) tablet 1 mg   Followed by   NOREEN ON 09/10/2023] LORazepam  (ATIVAN ) tablet 1 mg   PTA Medications  Medication Sig   thiamine  (VITAMIN B-1) 100 MG tablet Take 1 tablet (100 mg total) by mouth daily.   Multiple Vitamin (MULTIVITAMIN WITH MINERALS) TABS tablet Take 1 tablet by mouth daily.      Medical Decision Making  Chad Tanner is a 33 y/o male with a history alcohol abuse presented voluntarily to [facility] as a walk in accompanied with  complaints of worsening alcohol abuse.     Recommendations  Based on my evaluation the patient does not appear to have an emergency medical condition.  Patient will be admitted to [facility] until labs result and then patient will transition to [facility] to safely detox from alcohol.  Chad No E Nanami [place], NP 09/06/23  1:43 AM

## 2023-09-06 NOTE — Group Note (Signed)
 Group Topic: Emotional Regulation  Group Date: 09/06/2023 Start Time: 1710 End Time: 1740 Facilitators: Daved Tinnie HERO, RN  Department: [facility]  Number of Participants: 7  Group Focus: loss/grief issues Treatment Modality:  Psychoeducation Interventions utilized were exploration Purpose: increase insight  Name: Chad Tanner Date of Birth: 1990/03/10  MR: [id number]    Level of Participation: pt did not attend group Plan: patient will be encouraged to attend future RN education groups  Patients Problems:  Patient Active Problem List   Diagnosis Date Noted   Alcohol use disorder 09/06/2023   Alcohol abuse 11/15/2022   Alcohol dependence (HCC) 05/01/2013   Depression 05/01/2013

## 2023-09-07 DIAGNOSIS — F32A Depression, unspecified: Secondary | ICD-10-CM | POA: Diagnosis not present

## 2023-09-07 DIAGNOSIS — F17210 Nicotine dependence, cigarettes, uncomplicated: Secondary | ICD-10-CM | POA: Diagnosis not present

## 2023-09-07 DIAGNOSIS — F411 Generalized anxiety disorder: Secondary | ICD-10-CM | POA: Diagnosis not present

## 2023-09-07 DIAGNOSIS — F1024 Alcohol dependence with alcohol-induced mood disorder: Secondary | ICD-10-CM | POA: Diagnosis not present

## 2023-09-07 NOTE — Group Note (Signed)
 Group Topic: Decisional Balance/Substance Abuse  Group Date: 09/07/2023 Start Time: 0800 End Time: 0900 Facilitators: Lonzell Dwayne RAMAN, NT  Department: [facility]  Number of Participants: 8  Group Focus: chemical dependency issues Treatment Modality:  Patient-Centered Therapy Interventions utilized were patient education Purpose: reinforce self-care  Name: Chad Tanner Date of Birth: December 11, 1990  MR: [id number]    Level of Participation: Patient attended group active Quality of Participation: attentive Interactions with others: gave feedback Mood/Affect: positive Triggers (if applicable): N/A Cognition: coherent/clear Progress: Moderate Response: Appropriate Plan: patient will be encouraged to continue with treatment.  Patients Problems:  Patient Active Problem List   Diagnosis Date Noted   Alcohol use disorder 09/06/2023   Alcohol abuse 11/15/2022   Alcohol dependence (HCC) 05/01/2013   Depression 05/01/2013

## 2023-09-07 NOTE — ED Notes (Signed)
 Patient sitting in dayroom watching tv with peers.  No distress.  Will monitor.

## 2023-09-07 NOTE — Group Note (Signed)
 Group Topic: Recovery Basics  Group Date: 09/07/2023 Start Time: 1205 End Time: 1240 Facilitators: Stanly Stabile, RN  Department: [facility]  Number of Participants: 7  Group Focus: chemical dependency education, chemical dependency issues, clarity of thought, communication, and coping skills Treatment Modality:  Behavior Modification Therapy Interventions utilized were clarification, exploration, patient education, and problem solving Purpose: enhance coping skills, explore maladaptive thinking, express feelings, express irrational fears, improve communication skills, increase insight, regain self-worth, reinforce self-care, and relapse prevention strategies  Name: Chad Tanner Date of Birth: 1990-04-23  MR: [id number]    Level of Participation: minimal Quality of Participation: attentive Interactions with others: gave feedback Mood/Affect: appropriate Triggers (if applicable):   Cognition: coherent/clear Progress: Minimal Response:   Plan: follow-up needed  Patients Problems:  Patient Active Problem List   Diagnosis Date Noted   Alcohol use disorder 09/06/2023   Alcohol abuse 11/15/2022   Alcohol dependence (HCC) 05/01/2013   Depression 05/01/2013

## 2023-09-07 NOTE — ED Provider Notes (Signed)
 Facility Based Crisis Admission H&P  Date: 09/07/23 Patient Name: Chad Tanner MRN: [id number] Chief Complaint: worsening cocaine and alcohol use.   Diagnoses:  Final diagnoses:  Alcohol use, unspecified with alcohol-induced mood disorder (HCC)  GAD (generalized anxiety disorder)   HPI: Chad Tanner is a 33 y.o. male with a history of cocaine dependence, as well as alcohol dependence who presented to the [facility] on 07/19 for worsening alcohol dependence, & seeking detoxification from the substances, in an effort to regain his sobriety and quality of life.  Patient was hospitalized at a facility based crisis of this facility in an effort to assist him with his goal as listed above.  Patient assessment 09/07/2023: During encounter with patient mood remains dysphoric & melancholic, patient appears disheveled, continues to present with a flat affect and depressed mood, attention to personal hygiene and grooming is poor, eye contact is poor, speech is clear & coherent, but low in tone. Thought contents are organized and logical, and pt currently denies SI/HI/AVH or paranoia. There is no evidence of delusional thoughts. He denies first rank symptoms.  Patient reports that sleep last night was fair, reports that appetite is fair, reports some cravings for alcohol, and is agreeable to Naltrexone  being ordered for alcohol use disorder. We will start patient on 25 mg daily starting 07/21. Patient has been educated on medication rationales, benefits as well as possible side effects, and has verbalized understanding and is agreeable to medication trials.   We will continue to monitor, and patient is continuing to be open to going to rehabilitation in an effort to regain and maintain his sobriety. Patient to discuss this with CSW tomorrow morning. Discharge date to be determine based on availability of rehab and pt's improvement in symptoms.  Labs Reviewed:  Qt  prolonged yesterday at 488, but was prolonged during his stay here in April of this year and was recommended to f/u outpatient, but never did as per him. We have yet again, educated him to do so. BP was elevated yesterday, Norvasc  5 mg daily was started, which seems to have resolved the issue and BP is WNL at this time. We are continuing with meds as listed below and patient will work with his treatment team tomorrow to determine a safe discharge disposition. Currently tolerating Ativan  taper well.  PHQ 2-9:  Flowsheet Row ED from 09/06/2023 in [facility] ED from 11/15/2022 in [facility]  Thoughts that you would be better off dead, or of hurting yourself in some way Several days Not at all  PHQ-9 Total Score 9 10    Flowsheet Row ED from 09/06/2023 in [facility] Most recent reading at 09/06/2023  6:36 AM ED from 09/06/2023 in [facility] Most recent reading at 09/06/2023  2:32 AM ED from 11/15/2022 in [facility] Most recent reading at 11/15/2022 10:24 PM  C-SSRS RISK CATEGORY No Risk No Risk No Risk    Screenings    Flowsheet Row Most Recent Value  CIWA-Ar Total 2    Total Time spent with patient: 1.5 hours  Musculoskeletal  Strength & Muscle Tone: within normal limits Gait & Station: normal Patient leans: N/A  Psychiatric Specialty Exam  Presentation General Appearance:  Disheveled  Eye Contact: Fair  Speech: Clear and Coherent  Speech Volume: Normal  Handedness: Right   Mood and Affect  Mood: Depressed; Anxious  Affect: Congruent  Thought Process  Thought Processes: Coherent  Descriptions of Associations:Intact  Orientation:Full (Time, Place and Person)  Thought Content:WDL  Diagnosis of Schizophrenia or Schizoaffective disorder in past: No   Hallucinations:Hallucinations: None  Ideas of  Reference:None  Suicidal Thoughts:Suicidal Thoughts: No  Homicidal Thoughts:Homicidal Thoughts: No   Sensorium  Memory: Immediate Fair  Judgment: Fair  Insight: Fair   Art therapist  Concentration: Fair  Attention Span: Fair  Recall: Fair  Fund of Knowledge: Fair  Language: Fair   Psychomotor Activity  Psychomotor Activity: Psychomotor Activity: Normal   Assets  Assets: Communication Skills   Sleep  Sleep: Sleep: Fair Number of Hours of Sleep: 6   Nutritional Assessment (For OBS and FBC admissions only) Has the patient had a weight loss or gain of 10 pounds or more in the last 3 months?: No Has the patient had a decrease in food intake/or appetite?: No Does the patient have dental problems?: No Does the patient have eating habits or behaviors that may be indicators of an eating disorder including binging or inducing vomiting?: No Has the patient recently lost weight without trying?: 0 Has the patient been eating poorly because of a decreased appetite?: 1 Malnutrition Screening Tool Score: 1   Physical Exam Vitals reviewed.  HENT:     Head: Normocephalic.  Neurological:     General: No focal deficit present.     Mental Status: He is alert and oriented to person, place, and time.  Psychiatric:        Behavior: Behavior normal.        Thought Content: Thought content normal.        Judgment: Judgment normal.    Review of Systems  Psychiatric/Behavioral:  Positive for depression and substance abuse. Negative for hallucinations, memory loss and suicidal ideas. The patient is nervous/anxious and has insomnia.   All other systems reviewed and are negative.   Blood pressure 129/80, pulse 75, temperature 98.2 F (36.8 C), temperature source Oral, resp. rate 18, SpO2 99%. There is no height or weight on file to calculate BMI.  Is the patient at risk to self? No  Has the patient been a risk to self in the past 6 months? No .    Has the  patient been a risk to self within the distant past? No   Is the patient a risk to others? No   Has the patient been a risk to others in the past 6 months? No   Has the patient been a risk to others within the distant past? No   Past Medical History: denies  Family History: denies Social History: denies  Last Labs:  Admission on 09/06/2023, Discharged on 09/06/2023  Component Date Value Ref Range Status   WBC 09/06/2023 7.6  4.0 - 10.5 K/uL Final   RBC 09/06/2023 4.46  4.22 - 5.81 MIL/uL Final   Hemoglobin 09/06/2023 13.9  13.0 - 17.0 g/dL Final   HCT 92/80/7974 39.7  39.0 - 52.0 % Final   MCV 09/06/2023 89.0  80.0 - 100.0 fL Final   MCH 09/06/2023 31.2  26.0 - 34.0 pg Final   MCHC 09/06/2023 35.0  30.0 - 36.0 g/dL Final   RDW 92/80/7974 14.2  11.5 - 15.5 % Final   Platelets 09/06/2023 198  150 - 400 K/uL Final   nRBC 09/06/2023 0.0  0.0 - 0.2 % Final   Neutrophils Relative % 09/06/2023 60  % Final   Neutro Abs 09/06/2023 4.6  1.7 - 7.7 K/uL Final  Lymphocytes Relative 09/06/2023 34  % Final   Lymphs Abs 09/06/2023 2.6  0.7 - 4.0 K/uL Final   Monocytes Relative 09/06/2023 5  % Final   Monocytes Absolute 09/06/2023 0.4  0.1 - 1.0 K/uL Final   Eosinophils Relative 09/06/2023 0  % Final   Eosinophils Absolute 09/06/2023 0.0  0.0 - 0.5 K/uL Final   Basophils Relative 09/06/2023 1  % Final   Basophils Absolute 09/06/2023 0.1  0.0 - 0.1 K/uL Final   Immature Granulocytes 09/06/2023 0  % Final   Abs Immature Granulocytes 09/06/2023 0.01  0.00 - 0.07 K/uL Final   Performed at [facility], [address], [place], [place] [address]   Sodium 09/06/2023 142  135 - 145 mmol/L Final   Potassium 09/06/2023 3.7  3.5 - 5.1 mmol/L Final   Chloride 09/06/2023 105  98 - 111 mmol/L Final   CO2 09/06/2023 22  22 - 32 mmol/L Final   Glucose, Bld 09/06/2023 93  70 - 99 mg/dL Final   Glucose reference range applies only to samples taken after fasting for at least 8 hours.   BUN 09/06/2023 8   6 - 20 mg/dL Final   Creatinine, Ser 09/06/2023 1.03  0.61 - 1.24 mg/dL Final   Calcium 92/80/7974 9.1  8.9 - 10.3 mg/dL Final   Total Protein 92/80/7974 7.3  6.5 - 8.1 g/dL Final   Albumin 92/80/7974 4.4  3.5 - 5.0 g/dL Final   AST 92/80/7974 76 (H)  15 - 41 U/L Final   ALT 09/06/2023 37  0 - 44 U/L Final   Alkaline Phosphatase 09/06/2023 49  38 - 126 U/L Final   Total Bilirubin 09/06/2023 0.9  0.0 - 1.2 mg/dL Final   GFR, Estimated 09/06/2023 >60  >60 mL/min Final   Comment: (NOTE) Calculated using the CKD-EPI Creatinine Equation (2021)    Anion gap 09/06/2023 15  5 - 15 Final   Performed at [facility], [address] [place], [place] [address]   Alcohol, Ethyl (B) 09/06/2023 198 (H)  <15 mg/dL Final   Comment: (NOTE) For medical purposes only. Performed at [facility], [address], [place], [place] [address]    POC Amphetamine UR 09/06/2023 None Detected  NONE DETECTED (Cut Off Level 1000 ng/mL) Final   POC Secobarbital (BAR) 09/06/2023 None Detected  NONE DETECTED (Cut Off Level 300 ng/mL) Final   POC Buprenorphine (BUP) 09/06/2023 None Detected  NONE DETECTED (Cut Off Level 10 ng/mL) Final   POC Oxazepam (BZO) 09/06/2023 None Detected  NONE DETECTED (Cut Off Level 300 ng/mL) Final   POC Cocaine UR 09/06/2023 None Detected  NONE DETECTED (Cut Off Level 300 ng/mL) Final   POC Methamphetamine UR 09/06/2023 None Detected  NONE DETECTED (Cut Off Level 1000 ng/mL) Final   POC Morphine 09/06/2023 None Detected  NONE DETECTED (Cut Off Level 300 ng/mL) Final   POC Methadone UR 09/06/2023 None Detected  NONE DETECTED (Cut Off Level 300 ng/mL) Final   POC Oxycodone  UR 09/06/2023 None Detected  NONE DETECTED (Cut Off Level 100 ng/mL) Final   POC Marijuana UR 09/06/2023 None Detected  NONE DETECTED (Cut Off Level 50 ng/mL) Final   Magnesium  09/06/2023 1.9  1.7 - 2.4 mg/dL Final   Performed at [facility], [address] [place], [place] [address]    Allergies:  Patient has no known allergies.  Medications:  Facility Ordered Medications  Medication   acetaminophen  (TYLENOL ) tablet 650 mg  alum & mag hydroxide-simeth (MAALOX/MYLANTA) 200-200-20 MG/5ML suspension 30 mL   magnesium  hydroxide (MILK OF MAGNESIA) suspension 30 mL   haloperidol  (HALDOL ) tablet 5 mg   And   diphenhydrAMINE  (BENADRYL ) capsule 50 mg   haloperidol  lactate (HALDOL ) injection 5 mg   And   diphenhydrAMINE  (BENADRYL ) injection 50 mg   And   LORazepam  (ATIVAN ) injection 2 mg   haloperidol  lactate (HALDOL ) injection 10 mg   And   diphenhydrAMINE  (BENADRYL ) injection 50 mg   And   LORazepam  (ATIVAN ) injection 2 mg   traZODone  (DESYREL ) tablet 50 mg   thiamine  (VITAMIN B1) tablet 100 mg   multivitamin with minerals tablet 1 tablet   LORazepam  (ATIVAN ) tablet 1 mg   hydrOXYzine  (ATARAX ) tablet 25 mg   loperamide  (IMODIUM ) capsule 2-4 mg   ondansetron  (ZOFRAN -ODT) disintegrating tablet 4 mg   [COMPLETED] LORazepam  (ATIVAN ) tablet 1 mg   Followed by   LORazepam  (ATIVAN ) tablet 1 mg   Followed by   NOREEN ON 09/08/2023] LORazepam  (ATIVAN ) tablet 1 mg   Followed by   NOREEN ON 09/10/2023] LORazepam  (ATIVAN ) tablet 1 mg   [COMPLETED] cloNIDine  (CATAPRES ) tablet 0.1 mg   [COMPLETED] cloNIDine  (CATAPRES ) tablet 0.1 mg   cloNIDine  (CATAPRES ) tablet 0.1 mg   [COMPLETED] amLODipine  (NORVASC ) tablet 5 mg    Long Term Goals: Improvement in symptoms so as ready for discharge  Short Term Goals: Patient will verbalize feelings in meetings with treatment team members., Patient will attend at least of 50% of the groups daily., Pt will complete the PHQ9 on admission, day 3 and discharge., Patient will participate in completing the [place] Suicide Severity Rating Scale, Patient will score a low risk of violence for 24 hours prior to discharge, and Patient will take medications as prescribed daily.  Medical Decision Making  -Continue FBC admission - Continue Ativan  taper-CD  medication administration record for details -Continue Multivitamins/thiamine  replacements as per [facility] - Continue trazodone  50 mg nightly as needed for sleep -Continue as needed 50 mg hydroxyzine  every 6 hours for anxiety -Continue clonidine  0.1 mg as needed twice daily for SBP although 160 or DBP over 100 -Continue Norvasc  5 mg daily for hypertension -Given one-time dose of Norvasc  5 mg & 1 time dose of clonidine  0.1 mg on 7/19 -Start Naltrexone  25 mg on 7/21 for alcohol cravings   Recommendations  Based on my evaluation the patient continues to meet criteria for continuous stay at the Facility Based Treatment center here at the [facility] for treatment and stabilization, prior to rehab  -Monitor for signs of withdrawal -Continue CIWA Scores as per protocol - Oral thiamine  and MVI replacement - Abstinence from substances encouraged  - SW to look into options for outpatient SA treatment at discharge  -Q15 minute checks as per unit protocol  Donia Snell, NP 09/07/23  6:05 PM

## 2023-09-07 NOTE — Group Note (Signed)
 Group Topic: Understanding Self  Group Date: 09/07/2023 Start Time: 8069 End Time: 2000 Facilitators: Anice Benton LABOR, NT  Department: [facility]  Number of Participants: 4  Group Focus: self-awareness Treatment Modality:  Cognitive Behavioral Therapy Interventions utilized were mental fitness Purpose: explore maladaptive thinking  Name: Chad Tanner Date of Birth: 20-Jun-1990  MR: [id number]    Level of Participation: Did Not Attend Quality of Participation: N/A Interactions with others: N/A Mood/Affect: N/A Triggers (if applicable): N/A Cognition: N/A Progress: Other Response: N/A Plan: patient will be encouraged to attend group  Patients Problems:  Patient Active Problem List   Diagnosis Date Noted   Alcohol use disorder 09/06/2023   Alcohol abuse 11/15/2022   Alcohol dependence (HCC) 05/01/2013   Depression 05/01/2013

## 2023-09-08 DIAGNOSIS — F1024 Alcohol dependence with alcohol-induced mood disorder: Secondary | ICD-10-CM | POA: Diagnosis not present

## 2023-09-08 DIAGNOSIS — F17210 Nicotine dependence, cigarettes, uncomplicated: Secondary | ICD-10-CM | POA: Diagnosis not present

## 2023-09-08 DIAGNOSIS — F411 Generalized anxiety disorder: Secondary | ICD-10-CM | POA: Diagnosis not present

## 2023-09-08 DIAGNOSIS — F32A Depression, unspecified: Secondary | ICD-10-CM | POA: Diagnosis not present

## 2023-09-08 MED ORDER — NALTREXONE HCL 50 MG PO TABS
25.0000 mg | ORAL_TABLET | Freq: Every day | ORAL | Status: DC
  Administered 2023-09-08 – 2023-09-12 (×5): 25 mg via ORAL
  Filled 2023-09-08: qty 7
  Filled 2023-09-08 (×5): qty 1

## 2023-09-08 NOTE — ED Provider Notes (Signed)
 Behavioral Health Progress Note        Date: 09/08/23 Patient Name: Chad Tanner MRN: [id number] Chief Complaint: worsening cocaine and alcohol use.   Diagnoses:  Final diagnoses:  Alcohol use, unspecified with alcohol-induced mood disorder (HCC)  GAD (generalized anxiety disorder)   HPI: Chad Tanner is a 33 y.o. male with a history of cocaine dependence, as well as alcohol dependence who presented to the [facility] on 07/19 for worsening alcohol dependence, & seeking detoxification from the substances, in an effort to regain his sobriety and quality of life.  Patient was hospitalized at a facility based crisis of this facility in an effort to assist him with his goal as listed above.  Patient assessment 09/08/2023: During encounter with patient mood remains dysphoric & melancholic, is continuing to appear disheveled, seems not to be tending to personal hygiene and grooming needs. He shares that last night, he was given Trazodone  and it caused him to itch, and rendered him not to be able to sleep. Patient also states that Trazodone  caused him to have auditory and visual disturbances; He  describes what seems more consistent with alcohol withdrawal related symptoms; states that he saw a tv in his room last night and heard his mother's voice in the room as well. At the time of this assessment, he was no longer having these symptoms. Currently denying SI/HI/AVH, denies paranoia and denies delusional thinking. There are no olvert signs of psychosis. We will keep a close eye on patient's withdrawal symptoms.   Sleep is reported today as poor, appetite as fair, he denies physical pain, states that he is moving bowels well, reporting hot flashes especially at nighttime, otherwise no other symptoms reported.  We will continue to follow closely, and discharge date will be revisited tomorrow, based on pt's progress. He continues to abe motivated to go to  rehab. Patient has a history of Qt prolongation, and his EKG was repeated today and remains prolonged. As per chart review, this was same during this last visit here at this facility, and he did not f/u with PCP as instructed. He has been educated to do so at time of discharge, and has verbalized understanding. He is currently asymptomatic. Norvasc  5 mg daily was started, which seems to have resolved the issue and BP is WNL at this time. We are continuing with meds as listed below and patient will work with his treatment team tomorrow to determine a safe discharge disposition. Currently tolerating Ativan  taper well. Taper ends on 7/23 at 1000, and he will work with CSW to go to rehab. Projected dc date will be same day or following day, 7/24.  PHQ 2-9:  Flowsheet Row ED from 09/06/2023 in [facility] ED from 11/15/2022 in [facility]  Thoughts that you would be better off dead, or of hurting yourself in some way Several days Not at all  PHQ-9 Total Score 9 10    Flowsheet Row ED from 09/06/2023 in [facility] Most recent reading at 09/06/2023  6:36 AM ED from 09/06/2023 in [facility] Most recent reading at 09/06/2023  2:32 AM ED from 11/15/2022 in [facility] Most recent reading at 11/15/2022 10:24 PM  C-SSRS RISK CATEGORY No Risk No Risk No Risk    Screenings    Flowsheet Row Most Recent Value  CIWA-Ar Total 0    Total Time spent with  patient: 1.5 hours  Musculoskeletal  Strength & Muscle Tone: within normal limits Gait & Station: normal Patient leans: N/A  Psychiatric Specialty Exam  Presentation General Appearance:  Disheveled  Eye Contact: Fair  Speech: Clear and Coherent  Speech Volume: Normal  Handedness: Right   Mood and Affect  Mood: Anxious; Depressed  Affect: Depressed; Congruent   Thought Process  Thought  Processes: Coherent  Descriptions of Associations:Intact  Orientation:Full (Time, Place and Person)  Thought Content:Logical  Diagnosis of Schizophrenia or Schizoaffective disorder in past: No   Hallucinations:Hallucinations: None  Ideas of Reference:None  Suicidal Thoughts:Suicidal Thoughts: No  Homicidal Thoughts:Homicidal Thoughts: No   Sensorium  Memory: Recent Fair  Judgment: Fair  Insight: Fair   Chartered certified accountant: Fair  Attention Span: Fair  Recall: [employer] of Knowledge: Fair  Language: Fair   Psychomotor Activity  Psychomotor Activity: Psychomotor Activity: Normal   Assets  Assets: Resilience   Sleep  Sleep: Sleep: Fair   No data recorded  Physical Exam Vitals reviewed.  HENT:     Head: Normocephalic.  Neurological:     General: No focal deficit present.     Mental Status: He is alert and oriented to person, place, and time.  Psychiatric:        Behavior: Behavior normal.        Thought Content: Thought content normal.        Judgment: Judgment normal.    Review of Systems  Psychiatric/Behavioral:  Positive for depression and substance abuse. Negative for hallucinations, memory loss and suicidal ideas. The patient is nervous/anxious and has insomnia.   All other systems reviewed and are negative.   Blood pressure (!) 127/96, pulse 82, temperature 98.2 F (36.8 C), temperature source Oral, resp. rate 15, SpO2 99%. There is no height or weight on file to calculate BMI.  Is the patient at risk to self? No  Has the patient been a risk to self in the past 6 months? No .    Has the patient been a risk to self within the distant past? No   Is the patient a risk to others? No   Has the patient been a risk to others in the past 6 months? No   Has the patient been a risk to others within the distant past? No   Past Medical History: denies  Family History: denies Social History: denies  Last Labs:   Admission on 09/06/2023, Discharged on 09/06/2023  Component Date Value Ref Range Status   WBC 09/06/2023 7.6  4.0 - 10.5 K/uL Final   RBC 09/06/2023 4.46  4.22 - 5.81 MIL/uL Final   Hemoglobin 09/06/2023 13.9  13.0 - 17.0 g/dL Final   HCT 92/80/7974 39.7  39.0 - 52.0 % Final   MCV 09/06/2023 89.0  80.0 - 100.0 fL Final   MCH 09/06/2023 31.2  26.0 - 34.0 pg Final   MCHC 09/06/2023 35.0  30.0 - 36.0 g/dL Final   RDW 92/80/7974 14.2  11.5 - 15.5 % Final   Platelets 09/06/2023 198  150 - 400 K/uL Final   nRBC 09/06/2023 0.0  0.0 - 0.2 % Final   Neutrophils Relative % 09/06/2023 60  % Final   Neutro Abs 09/06/2023 4.6  1.7 - 7.7 K/uL Final   Lymphocytes Relative 09/06/2023 34  % Final   Lymphs Abs 09/06/2023 2.6  0.7 - 4.0 K/uL Final   Monocytes Relative 09/06/2023 5  % Final   Monocytes Absolute 09/06/2023 0.4  0.1 -  1.0 K/uL Final   Eosinophils Relative 09/06/2023 0  % Final   Eosinophils Absolute 09/06/2023 0.0  0.0 - 0.5 K/uL Final   Basophils Relative 09/06/2023 1  % Final   Basophils Absolute 09/06/2023 0.1  0.0 - 0.1 K/uL Final   Immature Granulocytes 09/06/2023 0  % Final   Abs Immature Granulocytes 09/06/2023 0.01  0.00 - 0.07 K/uL Final   Performed at [facility], [address], [place], [place] [address]   Sodium 09/06/2023 142  135 - 145 mmol/L Final   Potassium 09/06/2023 3.7  3.5 - 5.1 mmol/L Final   Chloride 09/06/2023 105  98 - 111 mmol/L Final   CO2 09/06/2023 22  22 - 32 mmol/L Final   Glucose, Bld 09/06/2023 93  70 - 99 mg/dL Final   Glucose reference range applies only to samples taken after fasting for at least 8 hours.   BUN 09/06/2023 8  6 - 20 mg/dL Final   Creatinine, Ser 09/06/2023 1.03  0.61 - 1.24 mg/dL Final   Calcium 92/80/7974 9.1  8.9 - 10.3 mg/dL Final   Total Protein 92/80/7974 7.3  6.5 - 8.1 g/dL Final   Albumin 92/80/7974 4.4  3.5 - 5.0 g/dL Final   AST 92/80/7974 76 (H)  15 - 41 U/L Final   ALT 09/06/2023 37  0 - 44 U/L Final   Alkaline  Phosphatase 09/06/2023 49  38 - 126 U/L Final   Total Bilirubin 09/06/2023 0.9  0.0 - 1.2 mg/dL Final   GFR, Estimated 09/06/2023 >60  >60 mL/min Final   Comment: (NOTE) Calculated using the CKD-EPI Creatinine Equation (2021)    Anion gap 09/06/2023 15  5 - 15 Final   Performed at [facility], [address], [place], [place] [address]   Alcohol, Ethyl (B) 09/06/2023 198 (H)  <15 mg/dL Final   Comment: (NOTE) For medical purposes only. Performed at [facility], [address], [place], [place] [address]    POC Amphetamine UR 09/06/2023 None Detected  NONE DETECTED (Cut Off Level 1000 ng/mL) Final   POC Secobarbital (BAR) 09/06/2023 None Detected  NONE DETECTED (Cut Off Level 300 ng/mL) Final   POC Buprenorphine (BUP) 09/06/2023 None Detected  NONE DETECTED (Cut Off Level 10 ng/mL) Final   POC Oxazepam (BZO) 09/06/2023 None Detected  NONE DETECTED (Cut Off Level 300 ng/mL) Final   POC Cocaine UR 09/06/2023 None Detected  NONE DETECTED (Cut Off Level 300 ng/mL) Final   POC Methamphetamine UR 09/06/2023 None Detected  NONE DETECTED (Cut Off Level 1000 ng/mL) Final   POC Morphine 09/06/2023 None Detected  NONE DETECTED (Cut Off Level 300 ng/mL) Final   POC Methadone UR 09/06/2023 None Detected  NONE DETECTED (Cut Off Level 300 ng/mL) Final   POC Oxycodone  UR 09/06/2023 None Detected  NONE DETECTED (Cut Off Level 100 ng/mL) Final   POC Marijuana UR 09/06/2023 None Detected  NONE DETECTED (Cut Off Level 50 ng/mL) Final   Magnesium  09/06/2023 1.9  1.7 - 2.4 mg/dL Final   Performed at [facility], [address], [place], [place] [address]    Allergies: Patient has no known allergies.  Medications:  Facility Ordered Medications  Medication   acetaminophen  (TYLENOL ) tablet 650 mg   alum & mag hydroxide-simeth (MAALOX/MYLANTA) 200-200-20 MG/5ML suspension 30 mL   magnesium  hydroxide (MILK OF MAGNESIA) suspension 30 mL   haloperidol  (HALDOL ) tablet 5 mg   And    diphenhydrAMINE  (BENADRYL ) capsule 50  mg   haloperidol  lactate (HALDOL ) injection 5 mg   And   diphenhydrAMINE  (BENADRYL ) injection 50 mg   And   LORazepam  (ATIVAN ) injection 2 mg   haloperidol  lactate (HALDOL ) injection 10 mg   And   diphenhydrAMINE  (BENADRYL ) injection 50 mg   And   LORazepam  (ATIVAN ) injection 2 mg   traZODone  (DESYREL ) tablet 50 mg   thiamine  (VITAMIN B1) tablet 100 mg   multivitamin with minerals tablet 1 tablet   LORazepam  (ATIVAN ) tablet 1 mg   hydrOXYzine  (ATARAX ) tablet 25 mg   loperamide  (IMODIUM ) capsule 2-4 mg   ondansetron  (ZOFRAN -ODT) disintegrating tablet 4 mg   [COMPLETED] LORazepam  (ATIVAN ) tablet 1 mg   Followed by   LORazepam  (ATIVAN ) tablet 1 mg   Followed by   LORazepam  (ATIVAN ) tablet 1 mg   Followed by   NOREEN ON 09/10/2023] LORazepam  (ATIVAN ) tablet 1 mg   [COMPLETED] cloNIDine  (CATAPRES ) tablet 0.1 mg   [COMPLETED] cloNIDine  (CATAPRES ) tablet 0.1 mg   cloNIDine  (CATAPRES ) tablet 0.1 mg   [COMPLETED] amLODipine  (NORVASC ) tablet 5 mg   naltrexone  (DEPADE) tablet 25 mg    Long Term Goals: Improvement in symptoms so as ready for discharge  Short Term Goals: Patient will verbalize feelings in meetings with treatment team members., Patient will attend at least of 50% of the groups daily., Pt will complete the PHQ9 on admission, day 3 and discharge., Patient will participate in completing the [place] Suicide Severity Rating Scale, Patient will score a low risk of violence for 24 hours prior to discharge, and Patient will take medications as prescribed daily.  Medical Decision Making  -Continue [facility]BC admission - Continue Ativan  taper-CD medication administration record for details -Continue Multivitamins/thiamine  replacements as per MAR - Continue trazodone  50 mg nightly as needed for sleep -Continue as needed 50 mg hydroxyzine  every 6 hours for anxiety -Continue clonidine  0.1 mg as needed twice daily for SBP although 160 or DBP over  100 -Continue Norvasc  5 mg daily for hypertension -Given one-time dose of Norvasc  5 mg & 1 time dose of clonidine  0.1 mg on 7/19 -Continue Naltrexone  25 mg for alcohol cravings   Recommendations  Based on my evaluation the patient continues to meet criteria for continuous stay at the Facility Based Treatment center here at the [facility] for treatment and stabilization, prior to rehab  -Monitor for signs of withdrawal -Continue CIWA Scores as per protocol - Oral thiamine  and MVI replacement - Abstinence from substances encouraged  - SW to look into options for outpatient SA treatment at discharge  -Q15 minute checks as per unit protocol  Donia Snell, NP 09/08/23  2:40 PM

## 2023-09-08 NOTE — ED Notes (Signed)
 Patient said, I am getting better now. He reported sleeping well and eating adequate. He reported sweating in the night. Denied SI/HI, audiovisual hallucination and N/V. Denied having depression and anxiety. BM remains as usual Med compliant. No pain reported. Currently the patient is doing well.

## 2023-09-08 NOTE — ED Notes (Signed)
 Patient complained of itching attaching to the trazodone  PRN he took for sleeping. Benadryl  50 mg prn administered to him and then went back to sleep. Now he sleeping, no more issue

## 2023-09-08 NOTE — ED Notes (Signed)
 Patient is in the bedroom calm and sleeping. NAD. Respirations even and unlabored.  Environment secured per policy. Will keep monitoring for safety.

## 2023-09-08 NOTE — ED Notes (Signed)
Patient is in the bathroom

## 2023-09-08 NOTE — ED Notes (Signed)
 Patient woke up irritable with complaints of itching and lack of sleep last night.  He also did not like how the medication given to him last night made him feel.  Patient received benadryl  for itching which did not relieve it.  Patient given support.  Patient encouraged to take a cool shower increase PO fluids and to change his linens and scrubs.  Patient also encouraged to apply lotion.  Patient took RN's advice and he did as suggested.  Patient now resting in bed attempting to nap.  He reports feeling better.  Will monitor and provide support as needed.  Tolerating ativan  taper.

## 2023-09-08 NOTE — ED Notes (Signed)
 Patient is in the bedroom calm and and sleeping. NAD.  Will monitor for safety

## 2023-09-08 NOTE — Group Note (Signed)
 Group Topic: Change and Accountability  Group Date: 09/08/2023 Start Time: 2100 End Time: 2200 Facilitators: Joan Plowman B  Department: [facility]  Number of Participants: 1  Group Focus: abuse issues, acceptance, anger management, anxiety, chemical dependency education, chemical dependency issues, co-dependency, daily focus, depression, diagnosis education, discharge education, and individual meeting Treatment Modality:  Individual Therapy Interventions utilized were leisure development Purpose: enhance coping skills, express feelings, express irrational fears, increase insight, and relapse prevention strategies  Name: Chad Tanner Date of Birth: 1990/03/16  MR: [id number]    Level of Participation: PT DID NOT ATTEND GROUP Quality of Participation: cooperative Interactions with others: gave feedback Mood/Affect: appropriate Triggers (if applicable): NA Cognition: coherent/clear Progress: None Response: NA Plan: patient will be encouraged to go to groups.   Patients Problems:  Patient Active Problem List   Diagnosis Date Noted   Alcohol use disorder 09/06/2023   Alcohol abuse 11/15/2022   Alcohol dependence (HCC) 05/01/2013   Depression 05/01/2013

## 2023-09-08 NOTE — Discharge Planning (Signed)
 LCSW met with patient to assess current mood, affect, physical state, and inquire about needs/goals while here in [facility] and after discharge. Patient reports he presented to [facility] for alcohol dependence and reported drinking 2-3 1/5th's of alcohol daily. Patient reports he has been living in between his girlfriends and his aunt/uncles home.   Patient stated his girlfriend was not a good influence and had been with her on and off for 17 years.Patient stated he asked her to bring him here and she wouldn't so he walked here Friday night and took him several hours in the dark.  Patient reported having good supports of his dad and aunt and uncle. Patient stated that he was incarcerated for DWI and in jail for 8 months and released in April 2024 and had begun drinking again. Patient denies having a PCP or taking any medications prior.   Patient reports his current goal is to seek residential placement for substance use. Patient does not have insurance and aware of limitations with residential treatment centers having bed availability in the time frame of general length of stay here and agreed to follow up with these programs. Patient denies any prior history of outpatient or inpatient substance abuse treatment.  Patient currently denies any SI/HI/AVH and reports mood as calm and cooperative.   Patient aware that LCSW will send referrals out for review and will follow up to provide updates as received. Patient expressed understanding and appreciation of LCSW assistance. No other needs were reported at this time by patient.

## 2023-09-09 DIAGNOSIS — F32A Depression, unspecified: Secondary | ICD-10-CM | POA: Diagnosis not present

## 2023-09-09 DIAGNOSIS — F17210 Nicotine dependence, cigarettes, uncomplicated: Secondary | ICD-10-CM | POA: Diagnosis not present

## 2023-09-09 DIAGNOSIS — F411 Generalized anxiety disorder: Secondary | ICD-10-CM | POA: Diagnosis not present

## 2023-09-09 DIAGNOSIS — F1024 Alcohol dependence with alcohol-induced mood disorder: Secondary | ICD-10-CM | POA: Diagnosis not present

## 2023-09-09 NOTE — ED Notes (Signed)
 Pt asleep at this time, CIWA assessment could not be completed.

## 2023-09-09 NOTE — ED Notes (Signed)
 Patient asleep in bed. NAD. Will keep monitoring for safety.

## 2023-09-09 NOTE — Group Note (Signed)
 Group Topic: Communication  Group Date: 09/09/2023 Start Time: 2000 End Time: 2100 Facilitators: Joan Plowman B  Department: [facility]  Number of Participants: 5  Group Focus: abuse issues and activities of daily living skills Treatment Modality:  Individual Therapy Interventions utilized were leisure development Purpose: enhance coping skills, express feelings, relapse prevention strategies, and trigger / craving management  Name: Chad Tanner Date of Birth: 1990-09-23  MR: [id number]    Level of Participation: active Quality of Participation: attentive and cooperative Interactions with others: gave feedback Mood/Affect: appropriate and brightens with interaction Triggers (if applicable): NA Cognition: coherent/clear Progress: Gaining insight Response: Pt wants to be woke up at 7am to speak with someone about his discharging plans.  Plan: changes to discharge plan and changes to treatment plan  Patients Problems:  Patient Active Problem List   Diagnosis Date Noted   Alcohol use disorder 09/06/2023   Alcohol abuse 11/15/2022   Alcohol dependence (HCC) 05/01/2013   Depression 05/01/2013

## 2023-09-09 NOTE — ED Notes (Signed)
 Patient A&Ox4. Denies intent to harm self/others when asked. Denies A/VH. Patient denies any physical complaints when asked. No acute distress noted. Support and encouragement provided. Routine safety checks conducted according to facility protocol. Encouraged patient to notify staff if thoughts of harm toward self or others arise. Patient verbalize understanding and agreement. Will continue to monitor for safety.

## 2023-09-09 NOTE — ED Notes (Signed)
 Pt sitting in dayroom eating lunch quietly. No acute distress noted. No concerns voiced. Informed pt to notify staff with any needs or assistance. Pt verbalized understanding and agreement. Will continue to monitor for safety.

## 2023-09-09 NOTE — ED Notes (Signed)
 Pt wants to be awake at 7am

## 2023-09-09 NOTE — ED Provider Notes (Signed)
 Behavioral Health Progress Note        Date: 09/09/23 Patient Name: Chad Tanner MRN: [id number] Chief Complaint: worsening cocaine and alcohol use.   Diagnoses:  Final diagnoses:  Alcohol use, unspecified with alcohol-induced mood disorder (HCC)  GAD (generalized anxiety disorder)   HPI: Chad Tanner is a 33 y.o. male with a history of cocaine dependence, as well as alcohol dependence who presented to the [facility] on 07/19 for worsening alcohol dependence, & seeking detoxification from the substances, in an effort to regain his sobriety and quality of life.  Patient was hospitalized at a facility based crisis of this facility in an effort to assist him with his goal as listed above.  Patient assessment 09/09/2023: On assessment today, the pt reports that their mood is still depressed, but slightly improved today as compared to yesterday. Reports that anxiety is less than yesterday as well, and rates both depression and anxiety 5, 10 being worst. Sleep last night was better with him not getting the Trazodone . He shares that he experienced crawling sensations on his body with the Trazodone , but this is thought to be more related to withdrawal symptoms. Appetite is fair but improving. Concentration is fair.  Energy level is moderate today, and better than yesterday. Denies suicidal thoughts. Denies suicidal intent and plan.  Denies having any HI.  Denies having psychotic symptoms.Denies AVH, denies paranoia and denies delusional thinking.  Denies having side effects to current psychiatric medications.   We discussed no changes to current medication regimen, but to keep them the same.   Discussed the following psychosocial stressors: Persistent substance abuse: He continues to show motivation to go to rehab. We are continuing with meds as listed below and patient will work with his CSW to determine a safe discharge disposition. Currently  tolerating Ativan  taper well. Taper ends on 7/23 at 1000, and he will work with CSW to go to rehab. Projected dc date will be same day or following day, 7/24.  PHQ 2-9:  Flowsheet Row ED from 09/06/2023 in [facility] ED from 11/15/2022 in [facility]  Thoughts that you would be better off dead, or of hurting yourself in some way Several days Not at all  PHQ-9 Total Score 9 10    Flowsheet Row ED from 09/06/2023 in [facility] Most recent reading at 09/06/2023  6:36 AM ED from 09/06/2023 in [facility] Most recent reading at 09/06/2023  2:32 AM ED from 11/15/2022 in [facility] Most recent reading at 11/15/2022 10:24 PM  C-SSRS RISK CATEGORY No Risk No Risk No Risk    Screenings    Flowsheet Row Most Recent Value  CIWA-Ar Total 0    Total Time spent with patient: 1.5 hours  Musculoskeletal  Strength & Muscle Tone: within normal limits Gait & Station: normal Patient leans: N/A  Psychiatric Specialty Exam  Presentation General Appearance:  Appropriate for Environment; Fairly Groomed  Eye Contact: Fair  Speech: Clear and Coherent  Speech Volume: Normal  Handedness: Right   Mood and Affect  Mood: Depressed; Anxious  Affect: Congruent   Thought Process  Thought Processes: Coherent  Descriptions of Associations:Intact  Orientation:Full (Time, Place and Person)  Thought Content:Logical  Diagnosis of Schizophrenia or Schizoaffective disorder in past: No   Hallucinations:Hallucinations: None  Ideas of Reference:None  Suicidal Thoughts:Suicidal Thoughts: No  Homicidal Thoughts:Homicidal Thoughts: No   Sensorium  Memory: Immediate Fair  Judgment: Fair  Insight: Fair   Chartered certified accountant: Fair  Attention Span: Fair  Recall: Dotti Abe of  Knowledge: Fair  Language: Fair   Psychomotor Activity  Psychomotor Activity: Psychomotor Activity: Normal   Assets  Assets: Resilience   Sleep  Sleep: Sleep: Fair   No data recorded  Physical Exam Vitals reviewed.  HENT:     Head: Normocephalic.  Neurological:     General: No focal deficit present.     Mental Status: He is alert and oriented to person, place, and time.  Psychiatric:        Behavior: Behavior normal.        Thought Content: Thought content normal.        Judgment: Judgment normal.    Review of Systems  Psychiatric/Behavioral:  Positive for depression and substance abuse. Negative for hallucinations, memory loss and suicidal ideas. The patient is nervous/anxious and has insomnia.   All other systems reviewed and are negative.   Blood pressure 118/83, pulse 69, temperature 98.4 F (36.9 C), temperature source Oral, resp. rate 18, SpO2 98%. There is no height or weight on file to calculate BMI.  Is the patient at risk to self? No  Has the patient been a risk to self in the past 6 months? No .    Has the patient been a risk to self within the distant past? No   Is the patient a risk to others? No   Has the patient been a risk to others in the past 6 months? No   Has the patient been a risk to others within the distant past? No   Past Medical History: denies  Family History: denies Social History: denies  Last Labs:  Admission on 09/06/2023, Discharged on 09/06/2023  Component Date Value Ref Range Status   WBC 09/06/2023 7.6  4.0 - 10.5 K/uL Final   RBC 09/06/2023 4.46  4.22 - 5.81 MIL/uL Final   Hemoglobin 09/06/2023 13.9  13.0 - 17.0 g/dL Final   HCT 92/80/7974 39.7  39.0 - 52.0 % Final   MCV 09/06/2023 89.0  80.0 - 100.0 fL Final   MCH 09/06/2023 31.2  26.0 - 34.0 pg Final   MCHC 09/06/2023 35.0  30.0 - 36.0 g/dL Final   RDW 92/80/7974 14.2  11.5 - 15.5 % Final   Platelets 09/06/2023 198  150 - 400 K/uL Final   nRBC 09/06/2023 0.0   0.0 - 0.2 % Final   Neutrophils Relative % 09/06/2023 60  % Final   Neutro Abs 09/06/2023 4.6  1.7 - 7.7 K/uL Final   Lymphocytes Relative 09/06/2023 34  % Final   Lymphs Abs 09/06/2023 2.6  0.7 - 4.0 K/uL Final   Monocytes Relative 09/06/2023 5  % Final   Monocytes Absolute 09/06/2023 0.4  0.1 - 1.0 K/uL Final   Eosinophils Relative 09/06/2023 0  % Final   Eosinophils Absolute 09/06/2023 0.0  0.0 - 0.5 K/uL Final   Basophils Relative 09/06/2023 1  % Final   Basophils Absolute 09/06/2023 0.1  0.0 - 0.1 K/uL Final   Immature Granulocytes 09/06/2023 0  % Final   Abs Immature Granulocytes 09/06/2023 0.01  0.00 - 0.07 K/uL Final   Performed at [facility], [address] [place], [place] [address]   Sodium 09/06/2023 142  135 - 145 mmol/L Final   Potassium 09/06/2023 3.7  3.5 - 5.1 mmol/L Final   Chloride 09/06/2023 105  98 -  111 mmol/L Final   CO2 09/06/2023 22  22 - 32 mmol/L Final   Glucose, Bld 09/06/2023 93  70 - 99 mg/dL Final   Glucose reference range applies only to samples taken after fasting for at least 8 hours.   BUN 09/06/2023 8  6 - 20 mg/dL Final   Creatinine, Ser 09/06/2023 1.03  0.61 - 1.24 mg/dL Final   Calcium 92/80/7974 9.1  8.9 - 10.3 mg/dL Final   Total Protein 92/80/7974 7.3  6.5 - 8.1 g/dL Final   Albumin 92/80/7974 4.4  3.5 - 5.0 g/dL Final   AST 92/80/7974 76 (H)  15 - 41 U/L Final   ALT 09/06/2023 37  0 - 44 U/L Final   Alkaline Phosphatase 09/06/2023 49  38 - 126 U/L Final   Total Bilirubin 09/06/2023 0.9  0.0 - 1.2 mg/dL Final   GFR, Estimated 09/06/2023 >60  >60 mL/min Final   Comment: (NOTE) Calculated using the CKD-EPI Creatinine Equation (2021)    Anion gap 09/06/2023 15  5 - 15 Final   Performed at [facility], [address], [place], [place] [address]   Alcohol, Ethyl (B) 09/06/2023 198 (H)  <15 mg/dL Final   Comment: (NOTE) For medical purposes only. Performed at [facility], [address], [place], [place] [address]     POC Amphetamine UR 09/06/2023 None Detected  NONE DETECTED (Cut Off Level 1000 ng/mL) Final   POC Secobarbital (BAR) 09/06/2023 None Detected  NONE DETECTED (Cut Off Level 300 ng/mL) Final   POC Buprenorphine (BUP) 09/06/2023 None Detected  NONE DETECTED (Cut Off Level 10 ng/mL) Final   POC Oxazepam (BZO) 09/06/2023 None Detected  NONE DETECTED (Cut Off Level 300 ng/mL) Final   POC Cocaine UR 09/06/2023 None Detected  NONE DETECTED (Cut Off Level 300 ng/mL) Final   POC Methamphetamine UR 09/06/2023 None Detected  NONE DETECTED (Cut Off Level 1000 ng/mL) Final   POC Morphine 09/06/2023 None Detected  NONE DETECTED (Cut Off Level 300 ng/mL) Final   POC Methadone UR 09/06/2023 None Detected  NONE DETECTED (Cut Off Level 300 ng/mL) Final   POC Oxycodone  UR 09/06/2023 None Detected  NONE DETECTED (Cut Off Level 100 ng/mL) Final   POC Marijuana UR 09/06/2023 None Detected  NONE DETECTED (Cut Off Level 50 ng/mL) Final   Magnesium  09/06/2023 1.9  1.7 - 2.4 mg/dL Final   Performed at [facility], [address] [place], [place] [address]    Allergies: Patient has no known allergies.  Medications:  Facility Ordered Medications  Medication   acetaminophen  (TYLENOL ) tablet 650 mg   alum & mag hydroxide-simeth (MAALOX/MYLANTA) 200-200-20 MG/5ML suspension 30 mL   magnesium  hydroxide (MILK OF MAGNESIA) suspension 30 mL   haloperidol  (HALDOL ) tablet 5 mg   And   diphenhydrAMINE  (BENADRYL ) capsule 50 mg   haloperidol  lactate (HALDOL ) injection 5 mg   And   diphenhydrAMINE  (BENADRYL ) injection 50 mg   And   LORazepam  (ATIVAN ) injection 2 mg   haloperidol  lactate (HALDOL ) injection 10 mg   And   diphenhydrAMINE  (BENADRYL ) injection 50 mg   And   LORazepam  (ATIVAN ) injection 2 mg   traZODone  (DESYREL ) tablet 50 mg   thiamine  (VITAMIN B1) tablet 100 mg   multivitamin with minerals tablet 1 tablet   [EXPIRED] LORazepam  (ATIVAN ) tablet 1 mg   [EXPIRED] hydrOXYzine  (ATARAX ) tablet 25 mg    [EXPIRED] loperamide  (IMODIUM ) capsule 2-4 mg   [EXPIRED] ondansetron  (ZOFRAN -ODT) disintegrating tablet 4  mg   [COMPLETED] LORazepam  (ATIVAN ) tablet 1 mg   Followed by   [COMPLETED] LORazepam  (ATIVAN ) tablet 1 mg   Followed by   [COMPLETED] LORazepam  (ATIVAN ) tablet 1 mg   Followed by   NOREEN ON 09/10/2023] LORazepam  (ATIVAN ) tablet 1 mg   [COMPLETED] cloNIDine  (CATAPRES ) tablet 0.1 mg   [COMPLETED] cloNIDine  (CATAPRES ) tablet 0.1 mg   cloNIDine  (CATAPRES ) tablet 0.1 mg   [COMPLETED] amLODipine  (NORVASC ) tablet 5 mg   naltrexone  (DEPADE) tablet 25 mg    Long Term Goals: Improvement in symptoms so as ready for discharge  Short Term Goals: Patient will verbalize feelings in meetings with treatment team members., Patient will attend at least of 50% of the groups daily., Pt will complete the PHQ9 on admission, day 3 and discharge., Patient will participate in completing the [place] Suicide Severity Rating Scale, Patient will score a low risk of violence for 24 hours prior to discharge, and Patient will take medications as prescribed daily.  Medical Decision Making  -Continue [facility] admission - Continue Ativan  taper-CD medication administration record for details -Continue Multivitamins/thiamine  replacements as per MAR - Continue trazodone  50 mg nightly as needed for sleep -Continue as needed 50 mg hydroxyzine  every 6 hours for anxiety -Continue clonidine  0.1 mg as needed twice daily for SBP although 160 or DBP over 100 -Continue Norvasc  5 mg daily for hypertension -Given one-time dose of Norvasc  5 mg & 1 time dose of clonidine  0.1 mg on 7/19 -Continue Naltrexone  25 mg for alcohol cravings   Recommendations  Based on my evaluation the patient continues to meet criteria for continuous stay at the Facility Based Treatment center here at the [facility] for treatment and stabilization, prior to rehab  -Monitor for signs of withdrawal -Continue CIWA Scores as per protocol - Oral thiamine  and MVI  replacement - Abstinence from substances encouraged  - SW to look into options for outpatient SA treatment at discharge  -Q15 minute checks as per unit protocol  Donia Snell, NP 09/09/23  5:13 PM

## 2023-09-09 NOTE — Group Note (Signed)
 Group Topic: Change and Accountability  Group Date: 09/09/2023 Start Time: 0800 End Time: 0900 Facilitators: Lonzell Dwayne RAMAN, NT  Department: [facility]  Number of Participants: 7  Group Focus: acceptance Treatment Modality:  Behavior Modification Therapy Interventions utilized were problem solving Purpose: increase insight  Name: Chad Tanner Date of Birth: 1990-10-24  MR: [id number]    Level of Participation: Patient attended groupminimal Quality of Participation: attentive Interactions with others: gave feedback Mood/Affect: appropriate Triggers (if applicable): N/A Cognition: coherent/clear Progress: Gaining insight Response: Appropriate  Plan: patient will be encouraged to be more positive in his thought process.  Patients Problems:  Patient Active Problem List   Diagnosis Date Noted   Alcohol use disorder 09/06/2023   Alcohol abuse 11/15/2022   Alcohol dependence (HCC) 05/01/2013   Depression 05/01/2013

## 2023-09-09 NOTE — Group Note (Signed)
 Group Topic: Relapse and Recovery  Group Date: 09/08/2023 Start Time: 1205 End Time: 1230 Facilitators: Stanly Stabile, RN  Department: [facility]  Number of Participants: 5  Group Focus: check in, chemical dependency education, and chemical dependency issues Treatment Modality:  Behavior Modification Therapy Interventions utilized were clarification, exploration, patient education, and problem solving Purpose: enhance coping skills, explore maladaptive thinking, express feelings, express irrational fears, improve communication skills, increase insight, regain self-worth, reinforce self-care, and relapse prevention strategies  Name: Chad Tanner Date of Birth: [birth year]  MR: [id number]    Level of Participation: minimal Quality of Participation: attentive Interactions with others: gave feedback Mood/Affect: appropriate Triggers (if applicable):   Cognition: coherent/clear Progress: Minimal Response:   Plan: follow-up needed  Patients Problems:  Patient Active Problem List   Diagnosis Date Noted   Alcohol use disorder 09/06/2023   Alcohol abuse 11/15/2022   Alcohol dependence (HCC) 05/01/2013   Depression 05/01/2013

## 2023-09-09 NOTE — Group Note (Signed)
 Group Topic: Relapse and Recovery  Group Date: 09/09/2023 Start Time: 9089 End Time: 1000 Facilitators: Herold Lajuana NOVAK, RN  Department: [facility]  Number of Participants: 6  Group Focus: coping skills Treatment Modality:  Leisure Development Interventions utilized were leisure development Purpose: relapse prevention strategies  Name: Chad Tanner Date of Birth: 05/15/1990  MR: [id number]      Patients Problems:  Level of Participation: active Quality of Participation: cooperative Interactions with others: gave feedback Mood/Affect: appropriate Triggers (if applicable): none identified Cognition: coherent/clear Progress: gaining insight Response: I just get on social media to distract my mind

## 2023-09-10 DIAGNOSIS — F17210 Nicotine dependence, cigarettes, uncomplicated: Secondary | ICD-10-CM | POA: Diagnosis not present

## 2023-09-10 DIAGNOSIS — F1024 Alcohol dependence with alcohol-induced mood disorder: Secondary | ICD-10-CM | POA: Diagnosis not present

## 2023-09-10 DIAGNOSIS — F32A Depression, unspecified: Secondary | ICD-10-CM | POA: Diagnosis not present

## 2023-09-10 DIAGNOSIS — F411 Generalized anxiety disorder: Secondary | ICD-10-CM | POA: Diagnosis not present

## 2023-09-10 MED ORDER — MELATONIN 5 MG PO TABS
10.0000 mg | ORAL_TABLET | Freq: Every day | ORAL | Status: DC
  Administered 2023-09-10 – 2023-09-11 (×2): 10 mg via ORAL
  Filled 2023-09-10 (×2): qty 2

## 2023-09-10 NOTE — ED Notes (Signed)
 Patient A&Ox4. Denies intent to harm self/others when asked. Denies A/VH. Patient denies any physical complaints when asked. No acute distress noted. Support and encouragement provided. Routine safety checks conducted according to facility protocol. Encouraged patient to notify staff if thoughts of harm toward self or others arise. Patient verbalize understanding and agreement. Will continue to monitor for safety.

## 2023-09-10 NOTE — ED Notes (Signed)
Pt is sleeping, no acute distress noted.

## 2023-09-10 NOTE — Discharge Instructions (Signed)
 Referral was received by [facility] and per Admissions, patient has been accepted to their facility for admission. Patient can admit to their facility on Friday, July 25 @ 11:00. [facility] will provide transport via [employer] or [employer] to facility.  Transport will be arranged for pick up at 10:30.  Patient will need 30 day scripts and a 7 day supply of medication and clothing.  No other needs to report at this time.   Stephane Posner, LCSW Clinical Social Worker [place] Ph: [phone]

## 2023-09-10 NOTE — ED Notes (Signed)
 Pt sleeping in no acute distress. RR even and unlabored. Environment secured. Will continue to monitor for safety.

## 2023-09-10 NOTE — Group Note (Signed)
 Group Topic: Recovery Basics  Group Date: 09/10/2023 Start Time: 1000 End Time: 1100 Facilitators: Nicholaus Arlyne BIRCH, NT  Department: [facility]  Number of Participants: 5  Group Focus: substance abuse education Treatment Modality:  Spiritual Interventions utilized were clarification Purpose: increase insight  Name: Chad Tanner Date of Birth: 05/22/1990  MR: [id number]    Level of Participation: active Quality of Participation: cooperative Interactions with others: cohesive Mood/Affect: appropriate Triggers (if applicable): none Cognition: coherent/clear Progress: Gaining insight Response: positive Plan: patient will be encouraged to continue recovery process  Patients Problems:  Patient Active Problem List   Diagnosis Date Noted   Alcohol use disorder 09/06/2023   Alcohol abuse 11/15/2022   Alcohol dependence (HCC) 05/01/2013   Depression 05/01/2013

## 2023-09-10 NOTE — ED Notes (Signed)
 Patient is sleeping. Respirations equal and unlabored. No change in assessment or acuity. Routine safety checks conducted according to facility protocol.

## 2023-09-10 NOTE — Discharge Planning (Signed)
 SW received call from [facility] [place] [facility] [place] with patient approval for admission to [facility] residential treatment program.    Patient will be discharging to [facility] on Friday July 25 at 11:00 am with transportation provided via [facility]    Patient will be transported via cab for pick up at 10:30. He will be provided with 30 days of scripts and 14 days of medications.   Location of facility is [address] [place], [place]. Number to call for emergency: [phone]

## 2023-09-10 NOTE — Group Note (Signed)
 Group Topic: Communication  Group Date: 09/10/2023 Start Time: 0900 End Time: 1000 Facilitators: Herold Lajuana NOVAK, RN  Department: [facility]  Number of Participants: 8  Group Focus: communication Treatment Modality:  Individual Therapy Interventions utilized were patient education Purpose: increase insight  Name: Chad Tanner Date of Birth: [birth year]  MR: [id number]    Level of Participation: active Quality of Participation: cooperative Interactions with others: gave feedback Mood/Affect: appropriate Triggers (if applicable): none identified Cognition: coherent/clear and goal directed Progress: Significant Response: Pt verbalized understanding of all medications administered Plan: patient will be encouraged to remain med compliant throughout tx and to notify staff with any questions or concerns  Patients Problems:  Patient Active Problem List   Diagnosis Date Noted   Alcohol use disorder 09/06/2023   Alcohol abuse 11/15/2022   Alcohol dependence (HCC) 05/01/2013   Depression 05/01/2013

## 2023-09-10 NOTE — Group Note (Signed)
 Group Topic: Feelings about Diagnosis  Group Date: 09/10/2023 Start Time: 2000 End Time: 2130 Facilitators: Joan Plowman B  Department: [facility]  Number of Participants: 6  Group Focus: abuse issues, acceptance, activities of daily living skills, chemical dependency issues, coping skills, daily focus, depression, discharge education, healthy friendships, personal responsibility, relapse prevention, relaxation, self-esteem, social skills, and substance abuse education Treatment Modality:  Leisure Development Interventions utilized were leisure development and patient education Purpose: express feelings, increase insight, and relapse prevention strategies  Name: Chad Tanner Date of Birth: Jun 21, 1990  MR: [id number]    Level of Participation: active Quality of Participation: attentive, cooperative, and initiates communication Interactions with others: gave feedback Mood/Affect: appropriate, brightens with interaction, and positive Triggers (if applicable): NA Cognition: coherent/clear, goal directed, and insightful Progress: Gaining insight Response: really wants to do a program to get himself better. He siads its the first time he's came for himself and he really wants to get help to get better.  Plan: patient will be encouraged to keep going to groups.   Patients Problems:  Patient Active Problem List   Diagnosis Date Noted   Alcohol use disorder 09/06/2023   Alcohol abuse 11/15/2022   Alcohol dependence (HCC) 05/01/2013   Depression 05/01/2013

## 2023-09-10 NOTE — ED Notes (Signed)
 PT wants to speak with the social worker ASAP this morning to talk about his discharge.

## 2023-09-10 NOTE — ED Provider Notes (Signed)
 Behavioral Health Progress Note        Date: 09/10/23 Patient Name: Chad Tanner MRN: [id number] Chief Complaint: worsening cocaine and alcohol use.   Diagnoses:  Final diagnoses:  Alcohol use, unspecified with alcohol-induced mood disorder (HCC)  GAD (generalized anxiety disorder)   HPI: Chad Tanner is a 33 y.o. male with a history of cocaine dependence, as well as alcohol dependence who presented to the [facility] on 07/19 for worsening alcohol dependence, & seeking detoxification from the substances, in an effort to regain his sobriety and quality of life.  Patient was hospitalized at a facility based crisis of this facility in an effort to assist him with his goal as listed above.  Patient assessment 09/10/2023: On assessment today, the pt reports that their mood is still moderately depressed. As per objective assessment, he presents with flat affect and depressed mood, attention to personal hygiene and grooming is fair, eye contact is good, speech is clear & coherent. Thought contents are organized and logical, and pt currently denies SI/HI/AVH or paranoia. There is no evidence of delusional thoughts.   Denies having side effects to current psychiatric medications.   We discussed no changes to current medication regimen, but to keep them the same.   Discussed the following psychosocial stressors: Persistent substance abuse: He continues to show motivation to go to rehab. We are continuing with meds as listed below and patient has begun conversations with his CSW to determine a safe discharge disposition. Currently tolerating Ativan  taper well. Taper ended today on 7/23 at 1000, and CSW has not yet found a rehab willing to take patient. There was a mix up today with [facility] as they made a mistake, stating that pt has legal issues with CSW was able to clarify, as they were mistaking this pt for another one of their patients. Projected dc date for   patient remains 7/24, unless CSW makes arrangements for transfer to a rehab door to door. In this case, the need to remain at the facility for 1-2 days will be reevaluated in order to ensure that pt does not relapse prior to going to rehab. In the absence of an accepting facility by midday on 7/24, pt will be discharged with options to follow up outpatient.  PHQ 2-9:  Flowsheet Row ED from 09/06/2023 in [facility] ED from 11/15/2022 in [facility]  Thoughts that you would be better off dead, or of hurting yourself in some way Several days Not at all  PHQ-9 Total Score 9 10    Flowsheet Row ED from 09/06/2023 in [facility] Most recent reading at 09/06/2023  6:36 AM ED from 09/06/2023 in [facility] Most recent reading at 09/06/2023  2:32 AM ED from 11/15/2022 in [facility] Most recent reading at 11/15/2022 10:24 PM  C-SSRS RISK CATEGORY No Risk No Risk No Risk    Screenings    Flowsheet Row Most Recent Value  CIWA-Ar Total 0    Total Time spent with patient: 1.5 hours  Musculoskeletal  Strength & Muscle Tone: within normal limits Gait & Station: normal Patient leans: N/A  Psychiatric Specialty Exam  Presentation General Appearance:  Fairly Groomed  Eye Contact: Fair  Speech: Clear and Coherent  Speech Volume: Normal  Handedness: Right   Mood and Affect  Mood: Depressed  Affect: Congruent   Thought Process  Thought Processes: Coherent  Descriptions of Associations:Intact  Orientation:Full (Time, Place and Person)  Thought Content:Logical  Diagnosis of Schizophrenia or Schizoaffective disorder in past: No   Hallucinations:Hallucinations: None  Ideas of Reference:None  Suicidal Thoughts:Suicidal Thoughts: No  Homicidal Thoughts:Homicidal Thoughts: No   Sensorium  Memory: Immediate  Fair  Judgment: Fair  Insight: Fair   Art therapist  Concentration: Fair  Attention Span: Fair  Recall: [employer] of Knowledge: Fair  Language: Fair   Psychomotor Activity  Psychomotor Activity: Psychomotor Activity: Normal   Assets  Assets: Resilience   Sleep  Sleep: Sleep: Fair   No data recorded  Physical Exam Vitals reviewed.  HENT:     Head: Normocephalic.  Neurological:     General: No focal deficit present.     Mental Status: He is alert and oriented to person, place, and time.  Psychiatric:        Behavior: Behavior normal.        Thought Content: Thought content normal.        Judgment: Judgment normal.    Review of Systems  Psychiatric/Behavioral:  Positive for depression and substance abuse. Negative for hallucinations, memory loss and suicidal ideas. The patient is nervous/anxious and has insomnia.   All other systems reviewed and are negative.   Blood pressure 127/86, pulse 83, temperature 98.4 F (36.9 C), temperature source Oral, resp. rate 16, SpO2 100%. There is no height or weight on file to calculate BMI.  Is the patient at risk to self? No  Has the patient been a risk to self in the past 6 months? No .    Has the patient been a risk to self within the distant past? No   Is the patient a risk to others? No   Has the patient been a risk to others in the past 6 months? No   Has the patient been a risk to others within the distant past? No   Past Medical History: denies  Family History: denies Social History: denies  Last Labs:  Admission on 09/06/2023, Discharged on 09/06/2023  Component Date Value Ref Range Status   WBC 09/06/2023 7.6  4.0 - 10.5 K/uL Final   RBC 09/06/2023 4.46  4.22 - 5.81 MIL/uL Final   Hemoglobin 09/06/2023 13.9  13.0 - 17.0 g/dL Final   HCT 92/80/7974 39.7  39.0 - 52.0 % Final   MCV 09/06/2023 89.0  80.0 - 100.0 fL Final   MCH 09/06/2023 31.2  26.0 - 34.0 pg Final   MCHC 09/06/2023 35.0   30.0 - 36.0 g/dL Final   RDW 92/80/7974 14.2  11.5 - 15.5 % Final   Platelets 09/06/2023 198  150 - 400 K/uL Final   nRBC 09/06/2023 0.0  0.0 - 0.2 % Final   Neutrophils Relative % 09/06/2023 60  % Final   Neutro Abs 09/06/2023 4.6  1.7 - 7.7 K/uL Final   Lymphocytes Relative 09/06/2023 34  % Final   Lymphs Abs 09/06/2023 2.6  0.7 - 4.0 K/uL Final   Monocytes Relative 09/06/2023 5  % Final   Monocytes Absolute 09/06/2023 0.4  0.1 - 1.0 K/uL Final   Eosinophils Relative 09/06/2023 0  % Final   Eosinophils Absolute 09/06/2023 0.0  0.0 - 0.5 K/uL Final   Basophils Relative 09/06/2023 1  % Final   Basophils Absolute 09/06/2023 0.1  0.0 - 0.1 K/uL Final   Immature Granulocytes 09/06/2023 0  % Final   Abs Immature Granulocytes 09/06/2023 0.01  0.00 - 0.07 K/uL Final  Performed at [facility], [address], [place], [place] [address]   Sodium 09/06/2023 142  135 - 145 mmol/L Final   Potassium 09/06/2023 3.7  3.5 - 5.1 mmol/L Final   Chloride 09/06/2023 105  98 - 111 mmol/L Final   CO2 09/06/2023 22  22 - 32 mmol/L Final   Glucose, Bld 09/06/2023 93  70 - 99 mg/dL Final   Glucose reference range applies only to samples taken after fasting for at least 8 hours.   BUN 09/06/2023 8  6 - 20 mg/dL Final   Creatinine, Ser 09/06/2023 1.03  0.61 - 1.24 mg/dL Final   Calcium 92/80/7974 9.1  8.9 - 10.3 mg/dL Final   Total Protein 92/80/7974 7.3  6.5 - 8.1 g/dL Final   Albumin 92/80/7974 4.4  3.5 - 5.0 g/dL Final   AST 92/80/7974 76 (H)  15 - 41 U/L Final   ALT 09/06/2023 37  0 - 44 U/L Final   Alkaline Phosphatase 09/06/2023 49  38 - 126 U/L Final   Total Bilirubin 09/06/2023 0.9  0.0 - 1.2 mg/dL Final   GFR, Estimated 09/06/2023 >60  >60 mL/min Final   Comment: (NOTE) Calculated using the CKD-EPI Creatinine Equation (2021)    Anion gap 09/06/2023 15  5 - 15 Final   Performed at [facility], [address], [place], [place] [address]   Alcohol, Ethyl (B) 09/06/2023 198 (H)  <15  mg/dL Final   Comment: (NOTE) For medical purposes only. Performed at [facility], [address], [place], [place] [address]    POC Amphetamine UR 09/06/2023 None Detected  NONE DETECTED (Cut Off Level 1000 ng/mL) Final   POC Secobarbital (BAR) 09/06/2023 None Detected  NONE DETECTED (Cut Off Level 300 ng/mL) Final   POC Buprenorphine (BUP) 09/06/2023 None Detected  NONE DETECTED (Cut Off Level 10 ng/mL) Final   POC Oxazepam (BZO) 09/06/2023 None Detected  NONE DETECTED (Cut Off Level 300 ng/mL) Final   POC Cocaine UR 09/06/2023 None Detected  NONE DETECTED (Cut Off Level 300 ng/mL) Final   POC Methamphetamine UR 09/06/2023 None Detected  NONE DETECTED (Cut Off Level 1000 ng/mL) Final   POC Morphine 09/06/2023 None Detected  NONE DETECTED (Cut Off Level 300 ng/mL) Final   POC Methadone UR 09/06/2023 None Detected  NONE DETECTED (Cut Off Level 300 ng/mL) Final   POC Oxycodone  UR 09/06/2023 None Detected  NONE DETECTED (Cut Off Level 100 ng/mL) Final   POC Marijuana UR 09/06/2023 None Detected  NONE DETECTED (Cut Off Level 50 ng/mL) Final   Magnesium  09/06/2023 1.9  1.7 - 2.4 mg/dL Final   Performed at [facility], [address], [place], [place] [address]    Allergies: Patient has no known allergies.  Medications:  Facility Ordered Medications  Medication   acetaminophen  (TYLENOL ) tablet 650 mg   alum & mag hydroxide-simeth (MAALOX/MYLANTA) 200-200-20 MG/5ML suspension 30 mL   magnesium  hydroxide (MILK OF MAGNESIA) suspension 30 mL   haloperidol  (HALDOL ) tablet 5 mg   And   diphenhydrAMINE  (BENADRYL ) capsule 50 mg   haloperidol  lactate (HALDOL ) injection 5 mg   And   diphenhydrAMINE  (BENADRYL ) injection 50 mg   And   LORazepam  (ATIVAN ) injection 2 mg   haloperidol  lactate (HALDOL ) injection 10 mg   And   diphenhydrAMINE  (BENADRYL ) injection 50 mg   And   LORazepam  (ATIVAN ) injection 2 mg   traZODone  (DESYREL ) tablet 50 mg   thiamine  (VITAMIN B1) tablet  100 mg    multivitamin with minerals tablet 1 tablet   [EXPIRED] LORazepam  (ATIVAN ) tablet 1 mg   [EXPIRED] hydrOXYzine  (ATARAX ) tablet 25 mg   [EXPIRED] loperamide  (IMODIUM ) capsule 2-4 mg   [EXPIRED] ondansetron  (ZOFRAN -ODT) disintegrating tablet 4 mg   [COMPLETED] LORazepam  (ATIVAN ) tablet 1 mg   Followed by   [COMPLETED] LORazepam  (ATIVAN ) tablet 1 mg   Followed by   [COMPLETED] LORazepam  (ATIVAN ) tablet 1 mg   Followed by   [COMPLETED] LORazepam  (ATIVAN ) tablet 1 mg   [COMPLETED] cloNIDine  (CATAPRES ) tablet 0.1 mg   [COMPLETED] cloNIDine  (CATAPRES ) tablet 0.1 mg   cloNIDine  (CATAPRES ) tablet 0.1 mg   [COMPLETED] amLODipine  (NORVASC ) tablet 5 mg   naltrexone  (DEPADE) tablet 25 mg    Long Term Goals: Improvement in symptoms so as ready for discharge  Short Term Goals: Patient will verbalize feelings in meetings with treatment team members., Patient will attend at least of 50% of the groups daily., Pt will complete the PHQ9 on admission, day 3 and discharge., Patient will participate in completing the [place] Suicide Severity Rating Scale, Patient will score a low risk of violence for 24 hours prior to discharge, and Patient will take medications as prescribed daily.  Medical Decision Making  -Continue [facility] admission -Completed Ativan  taper-See MAR for details -Continue Multivitamins/thiamine  replacements as per MAR -Continue trazodone  50 mg nightly as needed for sleep -Continue as needed 50 mg hydroxyzine  every 6 hours for anxiety -Continue clonidine  0.1 mg as needed twice daily for SBP although 160 or DBP over 100 -Continue Norvasc  5 mg daily for hypertension -Given one-time dose of Norvasc  5 mg & 1 time dose of clonidine  0.1 mg on 7/19 -Continue Naltrexone  25 mg for alcohol cravings   Recommendations  Based on my evaluation the patient continues to meet criteria for continuous stay at the Facility Based Treatment center here at the [facility] for treatment and stabilization, prior to  rehab  -Monitor for signs of withdrawal -Continue CIWA Scores as per protocol - Oral thiamine  and MVI replacement - Abstinence from substances encouraged  - SW to look into options for outpatient SA treatment at discharge  -Q15 minute checks as per unit protocol  Donia Snell, NP 09/10/23  7:18 PM

## 2023-09-11 DIAGNOSIS — F32A Depression, unspecified: Secondary | ICD-10-CM | POA: Diagnosis not present

## 2023-09-11 DIAGNOSIS — F411 Generalized anxiety disorder: Secondary | ICD-10-CM | POA: Diagnosis not present

## 2023-09-11 DIAGNOSIS — F1024 Alcohol dependence with alcohol-induced mood disorder: Secondary | ICD-10-CM | POA: Diagnosis not present

## 2023-09-11 DIAGNOSIS — F17210 Nicotine dependence, cigarettes, uncomplicated: Secondary | ICD-10-CM | POA: Diagnosis not present

## 2023-09-11 MED ORDER — NALTREXONE HCL 50 MG PO TABS: 25.0000 mg | ORAL_TABLET | Freq: Every day | ORAL | 1 refills | Status: DC

## 2023-09-11 MED ORDER — TRAZODONE HCL 100 MG PO TABS
100.0000 mg | ORAL_TABLET | Freq: Every day | ORAL | Status: DC
  Administered 2023-09-11: 100 mg via ORAL
  Filled 2023-09-11: qty 14
  Filled 2023-09-11: qty 1

## 2023-09-11 MED ORDER — VITAMIN B-1 100 MG PO TABS: 100.0000 mg | ORAL_TABLET | Freq: Every day | ORAL | 1 refills | Status: DC

## 2023-09-11 MED ORDER — TRAZODONE HCL 100 MG PO TABS: 100.0000 mg | ORAL_TABLET | Freq: Every day | ORAL | 1 refills | Status: DC

## 2023-09-11 MED ORDER — TRAZODONE HCL 100 MG PO TABS: 100.0000 mg | ORAL_TABLET | Freq: Every evening | ORAL | Status: DC | PRN

## 2023-09-11 MED ORDER — ADULT MULTIVITAMIN W/MINERALS CH: 1.0000 | ORAL_TABLET | Freq: Every day | ORAL | 1 refills | Status: DC

## 2023-09-11 MED ORDER — IBUPROFEN 600 MG PO TABS: 600.0000 mg | ORAL_TABLET | Freq: Three times a day (TID) | ORAL | Status: DC | PRN

## 2023-09-11 NOTE — ED Notes (Signed)
 Patient observed/assessed at nursing station. Patient alert and oriented x 4. Affect is bright. Patient denies pain and anxiety. He denies A/V/H. He denies having any thoughts/plan of self harm and harm towards others. Fluid and snack offered. Patient states that appetite has been good throughout the day and ready to be discharge tomorrow. Verbalizes no further complaints at this time.

## 2023-09-11 NOTE — Group Note (Signed)
 Group Topic: Coping Skills  Group Date: 09/11/2023 Start Time: 1500 End Time: 1600 Facilitators: [place] Stephane HERO, [place]  Department: [facility]  Number of Participants: 9  Group Focus: anger management, anxiety, clarity of thought, coping skills, and feeling awareness/expression Treatment Modality:  Psychoeducation Interventions utilized were clarification, exploration, patient education, and problem solving Purpose: enhance coping skills, explore maladaptive thinking, express feelings, increase insight, reinforce self-care, relapse prevention strategies, and trigger / craving management  Name: Chad Tanner Date of Birth: May 19, 1990  MR: [id number]    Level of Participation: active Quality of Participation: attentive, cooperative, engaged, and initiates communication Interactions with others: gave feedback Mood/Affect: brightens with interaction and positive Triggers (if applicable): n/a Cognition: coherent/clear, concrete, and insightful Progress: Gaining insight Response: Gave supportive feedback Plan: referral / recommendations  Patients Problems:  Patient Active Problem List   Diagnosis Date Noted   Alcohol use disorder 09/06/2023   Alcohol abuse 11/15/2022   Alcohol dependence (HCC) 05/01/2013   Depression 05/01/2013

## 2023-09-11 NOTE — ED Notes (Signed)
 Patient is sleeping. Respirations equal and unlabored. No change in assessment or acuity. Routine safety checks conducted according to facility protocol.

## 2023-09-11 NOTE — ED Notes (Addendum)
 Pt c/o level 10 headache, prn tylenol  administered. Pt reports feeling 'fine' r/t mental health. Pt denies si hi avh- verbal contract for safety provided.

## 2023-09-11 NOTE — ED Notes (Addendum)
 Pt observed using the unit telephone and interacting appropriately on the milieu. He denies SI/HI/AVH. He denies physical symptoms. BP was  142/108 at 2021. Clonidine  0.1mg  given as per NP order. Manual BP was 132/80 at 2140. Pt asked for melatonin along with Trazodone  due to trouble sleeping last night. NP notified and Melatonin 10 mg ordered. He is safe on the unit at this time with Q15 minute safety checks in place.

## 2023-09-11 NOTE — Progress Notes (Signed)
 Patient resting quietly in bed with eyes closed with unlabored breathing. Q 15 minute safety checks remain in place.  Pt remains safe on the unit at this time.

## 2023-09-11 NOTE — ED Provider Notes (Signed)
 FBC/OBS ASAP Discharge Summary  Date and Time: 09/11/2023 2:12 PM  Name: Chad Tanner  MRN:  [id number]   Discharge Diagnoses:  Final diagnoses:  Alcohol use, unspecified with alcohol-induced mood disorder (HCC)  GAD (generalized anxiety disorder)    Subjective: Today, patient denies suicidal thoughts. He denies homicidal thoughts. He denies auditory or visual hallucinations.  No paranoia. He reports a good appetite. He reports difficulty sleeping and does not feel like the trazodone  is working. Offered to increased the trazodone  to 100 mg at bedtime PRN to help promote sleep. He denies depressive symptoms. He reports feeling anxious due to worrying about going to long-term treatment because he's never been before. Verbal support provided and patient was given the opportunity to ask questions regarding residential treatment. He denies withdrawal symptoms. He denies medication side effects. He reports a headache and states that he took Tylenol  this morning but continues to have a headache. Will prescribe Motrin  as needed for headache.  Stay Summary: Chad Tanner is a 33 year old male patient with a past psychiatric history significant for alcohol dependence, and depression who presented to the [facility] voluntary on 09/06/2023 with complaints of worsening alcohol abuse. He reported drinking 12 to 24 beers per day along with 2 pints of vodka daily. He reported drinking since he was 33 years old. BAL was 198 on arrival. He endorsed depressive symptoms of racing thoughts, anhedonia, sadness poor sleep and poor appetite. He was started on an Ativan  taper for alcohol withdrawal and tolerated the protocol without any side effects or severe withdrawal. He was prescribed clonidine  0.1 mg as needed for elevated blood pressure due to periods of hypertension. B/p today is 117/81. He was started on naltrexone  25 mg for alcohol cravings and has tolerated the medication  without any noticeable side effects. He has been accepted to [facility] and will be discharged on 09/12/2502 for long-term rehabilitation.  Total Time spent with patient: 30 minutes  Past Psychiatric History: history of alcohol dependence, depression Past Medical History: No reported history Family Psychiatric History: Maternal uncles and aunt history of alcoholism Social History: Patient resides with his mother.  He is unemployed. Tobacco Cessation:  A prescription for an FDA-approved tobacco cessation medication was offered at discharge and the patient refused  Current Medications:  Current Facility-Administered Medications  Medication Dose Route Frequency Provider Last Rate Last Admin   acetaminophen  (TYLENOL ) tablet 650 mg  650 mg Oral Q6H PRN Bobbitt, Shalon E, NP   650 mg at 09/11/23 0909   alum & mag hydroxide-simeth (MAALOX/MYLANTA) 200-200-20 MG/5ML suspension 30 mL  30 mL Oral Q4H PRN Bobbitt, Shalon E, NP       cloNIDine  (CATAPRES ) tablet 0.1 mg  0.1 mg Oral BID PRN Tex Drilling, NP   0.1 mg at 09/10/23 2021   haloperidol  (HALDOL ) tablet 5 mg  5 mg Oral TID PRN Bobbitt, Shalon E, NP       And   diphenhydrAMINE  (BENADRYL ) capsule 50 mg  50 mg Oral TID PRN Bobbitt, Shalon E, NP   50 mg at 09/08/23 0232   haloperidol  lactate (HALDOL ) injection 5 mg  5 mg Intramuscular TID PRN Bobbitt, Shalon E, NP       And   diphenhydrAMINE  (BENADRYL ) injection 50 mg  50 mg Intramuscular TID PRN Bobbitt, Shalon E, NP       And   LORazepam  (ATIVAN ) injection 2 mg  2 mg Intramuscular TID PRN Bobbitt, Shalon E, NP  haloperidol  lactate (HALDOL ) injection 10 mg  10 mg Intramuscular TID PRN Bobbitt, Shalon E, NP       And   diphenhydrAMINE  (BENADRYL ) injection 50 mg  50 mg Intramuscular TID PRN Bobbitt, Shalon E, NP       And   LORazepam  (ATIVAN ) injection 2 mg  2 mg Intramuscular TID PRN Bobbitt, Shalon E, NP       ibuprofen  (ADVIL ) tablet 600 mg  600 mg Oral Q8H PRN Bitania Shankland L, NP        magnesium  hydroxide (MILK OF MAGNESIA) suspension 30 mL  30 mL Oral Daily PRN Bobbitt, Shalon E, NP       melatonin tablet 10 mg  10 mg Oral QHS Trudy Carwin, NP   10 mg at 09/10/23 2052   multivitamin with minerals tablet 1 tablet  1 tablet Oral Daily Bobbitt, Shalon E, NP   1 tablet at 09/11/23 0910   naltrexone  (DEPADE) tablet 25 mg  25 mg Oral Daily Nkwenti, Donia, NP   25 mg at 09/11/23 9089   thiamine  (VITAMIN B1) tablet 100 mg  100 mg Oral Daily Bobbitt, Shalon E, NP   100 mg at 09/11/23 9088   traZODone  (DESYREL ) tablet 100 mg  100 mg Oral QHS Lavaughn Haberle L, NP       Current Outpatient Medications  Medication Sig Dispense Refill   [START ON 09/12/2023] Multiple Vitamin (MULTIVITAMIN WITH MINERALS) TABS tablet Take 1 tablet by mouth daily. 30 tablet 1   [START ON 09/12/2023] naltrexone  (DEPADE) 50 MG tablet Take 0.5 tablets (25 mg total) by mouth daily. 15 tablet 1   [START ON 09/12/2023] thiamine  (VITAMIN B-1) 100 MG tablet Take 1 tablet (100 mg total) by mouth daily. 30 tablet 1   traZODone  (DESYREL ) 100 MG tablet Take 1 tablet (100 mg total) by mouth at bedtime. 30 tablet 1    PTA Medications:  Facility Ordered Medications  Medication   acetaminophen  (TYLENOL ) tablet 650 mg   alum & mag hydroxide-simeth (MAALOX/MYLANTA) 200-200-20 MG/5ML suspension 30 mL   magnesium  hydroxide (MILK OF MAGNESIA) suspension 30 mL   haloperidol  (HALDOL ) tablet 5 mg   And   diphenhydrAMINE  (BENADRYL ) capsule 50 mg   haloperidol  lactate (HALDOL ) injection 5 mg   And   diphenhydrAMINE  (BENADRYL ) injection 50 mg   And   LORazepam  (ATIVAN ) injection 2 mg   haloperidol  lactate (HALDOL ) injection 10 mg   And   diphenhydrAMINE  (BENADRYL ) injection 50 mg   And   LORazepam  (ATIVAN ) injection 2 mg   thiamine  (VITAMIN B1) tablet 100 mg   multivitamin with minerals tablet 1 tablet   [EXPIRED] LORazepam  (ATIVAN ) tablet 1 mg   [EXPIRED] hydrOXYzine  (ATARAX ) tablet 25 mg   [EXPIRED] loperamide  (IMODIUM )  capsule 2-4 mg   [EXPIRED] ondansetron  (ZOFRAN -ODT) disintegrating tablet 4 mg   [COMPLETED] LORazepam  (ATIVAN ) tablet 1 mg   Followed by   [COMPLETED] LORazepam  (ATIVAN ) tablet 1 mg   Followed by   [COMPLETED] LORazepam  (ATIVAN ) tablet 1 mg   Followed by   [COMPLETED] LORazepam  (ATIVAN ) tablet 1 mg   [COMPLETED] cloNIDine  (CATAPRES ) tablet 0.1 mg   [COMPLETED] cloNIDine  (CATAPRES ) tablet 0.1 mg   cloNIDine  (CATAPRES ) tablet 0.1 mg   [COMPLETED] amLODipine  (NORVASC ) tablet 5 mg   naltrexone  (DEPADE) tablet 25 mg   melatonin tablet 10 mg   traZODone  (DESYREL ) tablet 100 mg   ibuprofen  (ADVIL ) tablet 600 mg   PTA Medications  Medication Sig   [START ON 09/12/2023] naltrexone  (DEPADE)  50 MG tablet Take 0.5 tablets (25 mg total) by mouth daily.   [START ON 09/12/2023] thiamine  (VITAMIN B-1) 100 MG tablet Take 1 tablet (100 mg total) by mouth daily.   [START ON 09/12/2023] Multiple Vitamin (MULTIVITAMIN WITH MINERALS) TABS tablet Take 1 tablet by mouth daily.   traZODone  (DESYREL ) 100 MG tablet Take 1 tablet (100 mg total) by mouth at bedtime.       09/06/2023    6:45 PM 11/16/2022   11:01 AM  Depression screen PHQ 2/9  Decreased Interest 1 2  Down, Depressed, Hopeless 1 2  PHQ - 2 Score 2 4  Altered sleeping 1 0  Tired, decreased energy 1 1  Change in appetite 1 3  Feeling bad or failure about yourself  1 1  Trouble concentrating 1 1  Moving slowly or fidgety/restless 1 0  Suicidal thoughts 1 0  PHQ-9 Score 9 10  Difficult doing work/chores Somewhat difficult     Flowsheet Row ED from 09/06/2023 in [facility] Most recent reading at 09/06/2023  6:36 AM ED from 09/06/2023 in [facility] Most recent reading at 09/06/2023  2:32 AM ED from 11/15/2022 in [facility] Most recent reading at 11/15/2022 10:24 PM  C-SSRS RISK CATEGORY No Risk No Risk No Risk    Musculoskeletal  Strength & Muscle Tone:  within normal limits Gait & Station: normal Patient leans: N/A  Psychiatric Specialty Exam  Presentation  General Appearance:  Appropriate for Environment  Eye Contact: Fair  Speech: Clear and Coherent  Speech Volume: Normal  Handedness: Right   Mood and Affect  Mood: Anxious  Affect: Congruent   Thought Process  Thought Processes: Coherent  Descriptions of Associations:Intact  Orientation:Full (Time, Place and Person)  Thought Content:Logical  Diagnosis of Schizophrenia or Schizoaffective disorder in past: No    Hallucinations:Hallucinations: None  Ideas of Reference:None  Suicidal Thoughts:Suicidal Thoughts: No  Homicidal Thoughts:Homicidal Thoughts: No   Sensorium  Memory: Immediate Fair; Recent Fair; Remote Fair  Judgment: Fair  Insight: Fair   Chartered certified accountant: Fair  Attention Span: Fair  Recall: [employer] of Knowledge: Fair  Language: Fair   Psychomotor Activity  Psychomotor Activity: Psychomotor Activity: Normal   Assets  Assets: Communication Skills; Desire for Improvement; Physical Health; Leisure Time   Sleep  Sleep: Sleep: Fair   Physical Exam  Physical Exam Cardiovascular:     Rate and Rhythm: Normal rate.  Pulmonary:     Effort: Pulmonary effort is normal.  Musculoskeletal:        General: Normal range of motion.     Cervical back: Normal range of motion.  Neurological:     Mental Status: He is alert and oriented to person, place, and time.    Review of Systems  Constitutional: Negative.   HENT: Negative.    Eyes: Negative.   Respiratory: Negative.    Cardiovascular: Negative.   Gastrointestinal: Negative.   Genitourinary: Negative.   Musculoskeletal: Negative.   Neurological: Negative.   Endo/Heme/Allergies: Negative.    Blood pressure 117/81, pulse 79, temperature 98.2 F (36.8 C), temperature source Oral, resp. rate 18, SpO2 97%. There is no height or weight on  file to calculate BMI.  Demographic Factors:  Male, Low socioeconomic status, and Unemployed  Loss Factors: Financial problems/change in socioeconomic status  Historical Factors: NA  Risk Reduction Factors:   Sense of responsibility to family and Living with another person, especially a relative  Continued Clinical Symptoms:  Alcohol/Substance Abuse/Dependencies  Cognitive Features That Contribute To Risk:  None    Suicide Risk:  Minimal: No identifiable suicidal ideation.  Patients presenting with no risk factors but with morbid ruminations; may be classified as minimal risk based on the severity of the depressive symptoms  Plan Of Care/Follow-up recommendations:    Disposition: Patient to be discharged to [facility] on 09/12/2023. Patient provided with a 14-day supply of sample medications and a 30-day prescription +1 refill. Will prescribe thiamine  100 mg daily, Trazodone  100 mg as needed at bedtime for sleep, Multivitamin daily and Naltrexone  25 mg daily.  Arthea Nobel L, NP 09/11/2023, 2:12 PM

## 2023-09-11 NOTE — Group Note (Signed)
 Group Topic: Wellness  Group Date: 09/11/2023 Start Time: 1800 End Time: 1830 Facilitators: Daved Tinnie HERO, RN  Department: [facility]  Number of Participants: 10  Group Focus: nursing group Treatment Modality:  Psychoeducation Interventions utilized were patient education Purpose: reinforce self-care  Name: Chad Tanner Date of Birth: 09/01/1990  MR: [id number]    Level of Participation: moderate Quality of Participation: cooperative Interactions with others: gave feedback Mood/Affect: appropriate Triggers (if applicable): n/a Cognition: coherent/clear Progress: Gaining insight Response: RN reviewed medications with pt, questions denied Plan: patient will be encouraged to attend future RN education groups  Patients Problems:  Patient Active Problem List   Diagnosis Date Noted   Alcohol use disorder 09/06/2023   Alcohol abuse 11/15/2022   Alcohol dependence (HCC) 05/01/2013   Depression 05/01/2013

## 2023-09-11 NOTE — Group Note (Signed)
 Group Topic: Overcoming Obstacles  Group Date: 09/11/2023 Start Time: 0800 End Time: 0900 Facilitators: Lonzell Dwayne RAMAN, NT  Department: [facility]  Number of Participants: 7  Group Focus: goals/reality orientation Treatment Modality:  Patient-Centered Therapy Interventions utilized were mental fitness Purpose: enhance coping skills  Name: DESTON BILYEU Date of Birth: 1990/04/27  MR: [id number]    Level of Participation: Patient attended groupactive Quality of Participation: attentive Interactions with others: gave feedback Mood/Affect: positive Triggers (if applicable): N/A Cognition: coherent/clear Progress: Moderate Response: Appropriate  Plan: patient will be encouraged to continue to be positive   Patients Problems:  Patient Active Problem List   Diagnosis Date Noted   Alcohol use disorder 09/06/2023   Alcohol abuse 11/15/2022   Alcohol dependence (HCC) 05/01/2013   Depression 05/01/2013

## 2023-09-12 DIAGNOSIS — F1024 Alcohol dependence with alcohol-induced mood disorder: Secondary | ICD-10-CM | POA: Diagnosis not present

## 2023-09-12 DIAGNOSIS — F32A Depression, unspecified: Secondary | ICD-10-CM | POA: Diagnosis not present

## 2023-09-12 DIAGNOSIS — F411 Generalized anxiety disorder: Secondary | ICD-10-CM | POA: Diagnosis not present

## 2023-09-12 DIAGNOSIS — F17210 Nicotine dependence, cigarettes, uncomplicated: Secondary | ICD-10-CM | POA: Diagnosis not present

## 2023-09-12 NOTE — ED Notes (Signed)
 RN reviewed discharge paperwork including discharge plan, questions denied. Pt escorted off unit by staff. Items returned.

## 2023-09-12 NOTE — ED Notes (Signed)
 Pt is sleeping . No acute distress noted.

## 2023-09-12 NOTE — ED Notes (Signed)
 Pt says that he feels 'good', ready for discharge. Pt denies si hi avh- verbal contract for safety provided. Pt denies physical pain and discomforts. Pt ate breakfast.

## 2024-01-04 ENCOUNTER — Emergency Department (HOSPITAL_COMMUNITY)
Admission: EM | Admit: 2024-01-04 | Discharge: 2024-01-04 | Disposition: A | Discharge status: Home / Self Care | Payer: Self-pay | Attending: Emergency Medicine | Admitting: Emergency Medicine

## 2024-01-04 ENCOUNTER — Other Ambulatory Visit (HOSPITAL_COMMUNITY)
Admission: EM | Admit: 2024-01-04 | Discharge: 2024-01-09 | Disposition: A | Discharge status: Home / Self Care | Source: Intra-hospital

## 2024-01-04 ENCOUNTER — Ambulatory Visit (HOSPITAL_COMMUNITY)
Admission: EM | Admit: 2024-01-04 | Discharge: 2024-01-04 | Disposition: A | Discharge status: Other Acute Inpatient Hospital | Attending: Behavioral Health | Admitting: Behavioral Health

## 2024-01-04 ENCOUNTER — Other Ambulatory Visit: Payer: Self-pay

## 2024-01-04 DIAGNOSIS — Z560 Unemployment, unspecified: Secondary | ICD-10-CM | POA: Insufficient documentation

## 2024-01-04 DIAGNOSIS — F10239 Alcohol dependence with withdrawal, unspecified: Secondary | ICD-10-CM | POA: Insufficient documentation

## 2024-01-04 DIAGNOSIS — R9431 Abnormal electrocardiogram [ECG] [EKG]: Secondary | ICD-10-CM

## 2024-01-04 DIAGNOSIS — F10282 Alcohol dependence with alcohol-induced sleep disorder: Secondary | ICD-10-CM | POA: Insufficient documentation

## 2024-01-04 DIAGNOSIS — F1090 Alcohol use, unspecified, uncomplicated: Secondary | ICD-10-CM | POA: Insufficient documentation

## 2024-01-04 DIAGNOSIS — Z811 Family history of alcohol abuse and dependence: Secondary | ICD-10-CM | POA: Diagnosis not present

## 2024-01-04 DIAGNOSIS — F17210 Nicotine dependence, cigarettes, uncomplicated: Secondary | ICD-10-CM | POA: Insufficient documentation

## 2024-01-04 DIAGNOSIS — F10982 Alcohol use, unspecified with alcohol-induced sleep disorder: Secondary | ICD-10-CM

## 2024-01-04 DIAGNOSIS — Y902 Blood alcohol level of 40-59 mg/100 ml: Secondary | ICD-10-CM | POA: Insufficient documentation

## 2024-01-04 DIAGNOSIS — Z814 Family history of other substance abuse and dependence: Secondary | ICD-10-CM | POA: Insufficient documentation

## 2024-01-04 DIAGNOSIS — F1024 Alcohol dependence with alcohol-induced mood disorder: Secondary | ICD-10-CM | POA: Insufficient documentation

## 2024-01-04 DIAGNOSIS — Z79899 Other long term (current) drug therapy: Secondary | ICD-10-CM | POA: Insufficient documentation

## 2024-01-04 DIAGNOSIS — F1094 Alcohol use, unspecified with alcohol-induced mood disorder: Secondary | ICD-10-CM

## 2024-01-04 LAB — CBC WITH DIFFERENTIAL/PLATELET
Abs Immature Granulocytes: 0.02 K/uL (ref 0.00–0.07)
Abs Immature Granulocytes: 0.02 K/uL (ref 0.00–0.07)
Basophils Absolute: 0 K/uL (ref 0.0–0.1)
Basophils Absolute: 0 K/uL (ref 0.0–0.1)
Basophils Relative: 0 %
Basophils Relative: 1 %
Eosinophils Absolute: 0 K/uL (ref 0.0–0.5)
Eosinophils Absolute: 0 K/uL (ref 0.0–0.5)
Eosinophils Relative: 0 %
Eosinophils Relative: 0 %
HCT: 43.7 % (ref 39.0–52.0)
HCT: 43.4 % (ref 39.0–52.0)
Hemoglobin: 15.1 g/dL (ref 13.0–17.0)
Hemoglobin: 15.1 g/dL (ref 13.0–17.0)
Immature Granulocytes: 0 %
Immature Granulocytes: 0 %
Lymphocytes Relative: 12 %
Lymphocytes Relative: 17 %
Lymphs Abs: 1.1 K/uL (ref 0.7–4.0)
Lymphs Abs: 1.4 K/uL (ref 0.7–4.0)
MCH: 30.8 pg (ref 26.0–34.0)
MCH: 30.8 pg (ref 26.0–34.0)
MCHC: 34.6 g/dL (ref 30.0–36.0)
MCHC: 34.8 g/dL (ref 30.0–36.0)
MCV: 89 fL (ref 80.0–100.0)
MCV: 88.4 fL (ref 80.0–100.0)
Monocytes Absolute: 0.6 K/uL (ref 0.1–1.0)
Monocytes Absolute: 0.5 K/uL (ref 0.1–1.0)
Monocytes Relative: 7 %
Monocytes Relative: 6 %
Neutro Abs: 7.1 K/uL (ref 1.7–7.7)
Neutro Abs: 6.6 K/uL (ref 1.7–7.7)
Neutrophils Relative %: 81 %
Neutrophils Relative %: 76 %
Platelets: 235 K/uL (ref 150–400)
Platelets: 235 K/uL (ref 150–400)
RBC: 4.91 MIL/uL (ref 4.22–5.81)
RBC: 4.91 MIL/uL (ref 4.22–5.81)
RDW: 16.4 % — ABNORMAL HIGH (ref 11.5–15.5)
RDW: 16 % — ABNORMAL HIGH (ref 11.5–15.5)
WBC: 8.8 K/uL (ref 4.0–10.5)
WBC: 8.5 K/uL (ref 4.0–10.5)
nRBC: 0 % (ref 0.0–0.2)
nRBC: 0 % (ref 0.0–0.2)

## 2024-01-04 LAB — COMPREHENSIVE METABOLIC PANEL WITH GFR
ALT: 17 U/L (ref 0–44)
ALT: 17 U/L (ref 0–44)
AST: 60 U/L — ABNORMAL HIGH (ref 15–41)
AST: 60 U/L — ABNORMAL HIGH (ref 15–41)
Albumin: 4.3 g/dL (ref 3.5–5.0)
Albumin: 4.2 g/dL (ref 3.5–5.0)
Alkaline Phosphatase: 56 U/L (ref 38–126)
Alkaline Phosphatase: 56 U/L (ref 38–126)
Anion gap: 14 (ref 5–15)
Anion gap: 12 (ref 5–15)
BUN: 10 mg/dL (ref 6–20)
BUN: 10 mg/dL (ref 6–20)
CO2: 25 mmol/L (ref 22–32)
CO2: 27 mmol/L (ref 22–32)
Calcium: 9 mg/dL (ref 8.9–10.3)
Calcium: 9.3 mg/dL (ref 8.9–10.3)
Chloride: 101 mmol/L (ref 98–111)
Chloride: 99 mmol/L (ref 98–111)
Creatinine, Ser: 0.98 mg/dL (ref 0.61–1.24)
Creatinine, Ser: 1.17 mg/dL (ref 0.61–1.24)
GFR, Estimated: >60 mL/min (ref >60)
GFR, Estimated: >60 mL/min (ref >60)
Glucose, Bld: 64 mg/dL — ABNORMAL LOW (ref 70–99)
Glucose, Bld: 88 mg/dL (ref 70–99)
Potassium: 4.3 mmol/L (ref 3.5–5.1)
Potassium: 4.4 mmol/L (ref 3.5–5.1)
Sodium: 140 mmol/L (ref 135–145)
Sodium: 138 mmol/L (ref 135–145)
Total Bilirubin: 1.4 mg/dL — ABNORMAL HIGH (ref 0.0–1.2)
Total Bilirubin: 1.2 mg/dL (ref 0.0–1.2)
Total Protein: 7.4 g/dL (ref 6.5–8.1)
Total Protein: 7.5 g/dL (ref 6.5–8.1)

## 2024-01-04 LAB — POCT URINE DRUG SCREEN - MANUAL ENTRY (I-SCREEN)
POC Amphetamine UR: NOT DETECTED
POC Buprenorphine (BUP): NOT DETECTED
POC Cocaine UR: NOT DETECTED
POC Marijuana UR: NOT DETECTED
POC Methadone UR: NOT DETECTED
POC Methamphetamine UR: NOT DETECTED
POC Morphine: NOT DETECTED
POC Oxazepam (BZO): NOT DETECTED
POC Oxycodone UR: NOT DETECTED
POC Secobarbital (BAR): NOT DETECTED

## 2024-01-04 LAB — TSH: TSH: 0.392 u[IU]/mL (ref 0.350–4.500)

## 2024-01-04 LAB — HEMOGLOBIN A1C
Hgb A1c MFr Bld: 5.5 % (ref 4.8–5.6)
Mean Plasma Glucose: 111.15 mg/dL

## 2024-01-04 LAB — MAGNESIUM: Magnesium: 1.9 mg/dL (ref 1.7–2.4)

## 2024-01-04 LAB — LIPID PANEL
Cholesterol: 223 mg/dL — ABNORMAL HIGH (ref 0–200)
HDL: 124 mg/dL (ref >40)
LDL Cholesterol: 91 mg/dL (ref 0–99)
Total CHOL/HDL Ratio: 1.8 ratio
Triglycerides: 38 mg/dL (ref <150)
VLDL: 8 mg/dL (ref 0–40)

## 2024-01-04 LAB — ETHANOL
Alcohol, Ethyl (B): 55 mg/dL — ABNORMAL HIGH (ref <15)
Alcohol, Ethyl (B): <15 mg/dL (ref <15)

## 2024-01-04 MED ORDER — HALOPERIDOL LACTATE 5 MG/ML IJ SOLN: 5.0000 mg | Freq: Three times a day (TID) | INTRAMUSCULAR | Status: DC | PRN

## 2024-01-04 MED ORDER — DIPHENHYDRAMINE HCL 50 MG/ML IJ SOLN: 50.0000 mg | Freq: Three times a day (TID) | INTRAMUSCULAR | Status: DC | PRN

## 2024-01-04 MED ORDER — ALUM & MAG HYDROXIDE-SIMETH 200-200-20 MG/5ML PO SUSP: 30.0000 mL | ORAL | Status: DC | PRN

## 2024-01-04 MED ORDER — HYDROXYZINE HCL 25 MG PO TABS: 25.0000 mg | ORAL_TABLET | Freq: Three times a day (TID) | ORAL | Status: DC | PRN

## 2024-01-04 MED ORDER — LORAZEPAM 1 MG PO TABS: 1.0000 mg | ORAL_TABLET | Freq: Two times a day (BID) | ORAL | Status: DC

## 2024-01-04 MED ORDER — LORAZEPAM 1 MG PO TABS: 1.0000 mg | ORAL_TABLET | Freq: Three times a day (TID) | ORAL | Status: DC

## 2024-01-04 MED ORDER — LOPERAMIDE HCL 2 MG PO CAPS: 2.0000 mg | ORAL_CAPSULE | ORAL | Status: AC | PRN

## 2024-01-04 MED ORDER — ACETAMINOPHEN 325 MG PO TABS: 650.0000 mg | ORAL_TABLET | Freq: Four times a day (QID) | ORAL | Status: DC | PRN

## 2024-01-04 MED ORDER — CHLORDIAZEPOXIDE HCL 25 MG PO CAPS: 25.0000 mg | ORAL_CAPSULE | ORAL | Status: DC

## 2024-01-04 MED ORDER — DIPHENHYDRAMINE HCL 50 MG PO CAPS: 50.0000 mg | ORAL_CAPSULE | Freq: Three times a day (TID) | ORAL | Status: DC | PRN

## 2024-01-04 MED ORDER — THIAMINE MONONITRATE 100 MG PO TABS
100.0000 mg | ORAL_TABLET | Freq: Every day | ORAL | Status: DC
  Administered 2024-01-04: 100 mg via ORAL
  Filled 2024-01-04: qty 1

## 2024-01-04 MED ORDER — CHLORDIAZEPOXIDE HCL 25 MG PO CAPS: 25.0000 mg | ORAL_CAPSULE | Freq: Every day | ORAL | Status: DC

## 2024-01-04 MED ORDER — OLANZAPINE 10 MG IM SOLR: 10.0000 mg | Freq: Three times a day (TID) | INTRAMUSCULAR | Status: DC | PRN

## 2024-01-04 MED ORDER — THIAMINE HCL 100 MG/ML IJ SOLN: 100.0000 mg | Freq: Every day | INTRAMUSCULAR | Status: DC

## 2024-01-04 MED ORDER — LORAZEPAM 1 MG PO TABS
2.0000 mg | ORAL_TABLET | Freq: Four times a day (QID) | ORAL | Status: AC
  Administered 2024-01-04 – 2024-01-06 (×6): 2 mg via ORAL
  Filled 2024-01-04 (×6): qty 2

## 2024-01-04 MED ORDER — TRAZODONE HCL 50 MG PO TABS: 50.0000 mg | ORAL_TABLET | Freq: Every evening | ORAL | Status: DC | PRN

## 2024-01-04 MED ORDER — MELATONIN 3 MG PO TABS
3.0000 mg | ORAL_TABLET | Freq: Every day | ORAL | Status: DC
  Administered 2024-01-04: 3 mg via ORAL
  Filled 2024-01-04: qty 1

## 2024-01-04 MED ORDER — CHLORDIAZEPOXIDE HCL 25 MG PO CAPS: 25.0000 mg | ORAL_CAPSULE | Freq: Three times a day (TID) | ORAL | Status: DC

## 2024-01-04 MED ORDER — HALOPERIDOL LACTATE 5 MG/ML IJ SOLN: 10.0000 mg | Freq: Three times a day (TID) | INTRAMUSCULAR | Status: DC | PRN

## 2024-01-04 MED ORDER — HALOPERIDOL 5 MG PO TABS: 5.0000 mg | ORAL_TABLET | Freq: Three times a day (TID) | ORAL | Status: DC | PRN

## 2024-01-04 MED ORDER — ONDANSETRON 4 MG PO TBDP: 4.0000 mg | ORAL_TABLET | Freq: Four times a day (QID) | ORAL | Status: DC | PRN

## 2024-01-04 MED ORDER — LORAZEPAM 2 MG/ML IJ SOLN: 1.0000 mg | INTRAMUSCULAR | Status: DC | PRN

## 2024-01-04 MED ORDER — ADULT MULTIVITAMIN W/MINERALS CH
1.0000 | ORAL_TABLET | Freq: Every day | ORAL | Status: DC
  Administered 2024-01-04: 1 via ORAL
  Filled 2024-01-04: qty 1

## 2024-01-04 MED ORDER — MAGNESIUM OXIDE -MG SUPPLEMENT 400 (240 MG) MG PO TABS
200.0000 mg | ORAL_TABLET | Freq: Two times a day (BID) | ORAL | Status: DC
  Administered 2024-01-04 – 2024-01-09 (×10): 200 mg via ORAL
  Filled 2024-01-04 (×11): qty 1

## 2024-01-04 MED ORDER — LORAZEPAM 2 MG/ML IJ SOLN
1.0000 mg | INTRAMUSCULAR | Status: DC | PRN
Start: 2024-01-04 — End: 2024-01-09

## 2024-01-04 MED ORDER — FOLIC ACID 1 MG PO TABS
1.0000 mg | ORAL_TABLET | Freq: Every day | ORAL | Status: DC
  Administered 2024-01-04: 1 mg via ORAL
  Filled 2024-01-04: qty 1

## 2024-01-04 MED ORDER — LORAZEPAM 2 MG/ML IJ SOLN: 2.0000 mg | Freq: Three times a day (TID) | INTRAMUSCULAR | Status: DC | PRN

## 2024-01-04 MED ORDER — MAGNESIUM HYDROXIDE 400 MG/5ML PO SUSP: 30.0000 mL | Freq: Every day | ORAL | Status: DC | PRN

## 2024-01-04 MED ORDER — LORAZEPAM 1 MG PO TABS: 1.0000 mg | ORAL_TABLET | Freq: Every day | ORAL | Status: DC

## 2024-01-04 MED ORDER — THIAMINE MONONITRATE 100 MG PO TABS
100.0000 mg | ORAL_TABLET | Freq: Every day | ORAL | Status: DC
  Administered 2024-01-05 – 2024-01-09 (×5): 100 mg via ORAL
  Filled 2024-01-04 (×5): qty 1

## 2024-01-04 MED ORDER — LORAZEPAM 1 MG PO TABS
1.0000 mg | ORAL_TABLET | Freq: Every day | ORAL | Status: AC
  Administered 2024-01-09: 1 mg via ORAL
  Filled 2024-01-04: qty 1

## 2024-01-04 MED ORDER — LOPERAMIDE HCL 2 MG PO CAPS: 2.0000 mg | ORAL_CAPSULE | ORAL | Status: DC | PRN

## 2024-01-04 MED ORDER — LORAZEPAM 1 MG PO TABS
1.0000 mg | ORAL_TABLET | Freq: Three times a day (TID) | ORAL | Status: AC
  Administered 2024-01-06 – 2024-01-07 (×3): 1 mg via ORAL
  Filled 2024-01-04 (×3): qty 1

## 2024-01-04 MED ORDER — CHLORDIAZEPOXIDE HCL 25 MG PO CAPS
25.0000 mg | ORAL_CAPSULE | Freq: Four times a day (QID) | ORAL | Status: DC
  Administered 2024-01-04: 25 mg via ORAL
  Filled 2024-01-04: qty 1

## 2024-01-04 MED ORDER — OLANZAPINE 5 MG PO TBDP: 5.0000 mg | ORAL_TABLET | Freq: Three times a day (TID) | ORAL | Status: DC | PRN

## 2024-01-04 MED ORDER — LORAZEPAM 1 MG PO TABS
1.0000 mg | ORAL_TABLET | ORAL | Status: DC | PRN
  Administered 2024-01-04: 1 mg via ORAL
  Filled 2024-01-04: qty 1

## 2024-01-04 MED ORDER — LORAZEPAM 1 MG PO TABS: 1.0000 mg | ORAL_TABLET | Freq: Four times a day (QID) | ORAL | Status: DC

## 2024-01-04 MED ORDER — CHLORDIAZEPOXIDE HCL 25 MG PO CAPS: 25.0000 mg | ORAL_CAPSULE | Freq: Four times a day (QID) | ORAL | Status: DC | PRN

## 2024-01-04 MED ORDER — LORAZEPAM 1 MG PO TABS
1.0000 mg | ORAL_TABLET | Freq: Four times a day (QID) | ORAL | Status: AC | PRN
  Administered 2024-01-04: 1 mg via ORAL

## 2024-01-04 MED ORDER — THIAMINE HCL 100 MG/ML IJ SOLN
100.0000 mg | Freq: Once | INTRAMUSCULAR | Status: DC
  Filled 2024-01-04: qty 2

## 2024-01-04 MED ORDER — OLANZAPINE 10 MG IM SOLR: 5.0000 mg | Freq: Three times a day (TID) | INTRAMUSCULAR | Status: DC | PRN

## 2024-01-04 MED ORDER — ADULT MULTIVITAMIN W/MINERALS CH
1.0000 | ORAL_TABLET | Freq: Every day | ORAL | Status: DC
  Administered 2024-01-05 – 2024-01-09 (×5): 1 via ORAL
  Filled 2024-01-04 (×5): qty 1

## 2024-01-04 MED ORDER — LORAZEPAM 1 MG PO TABS
1.0000 mg | ORAL_TABLET | Freq: Two times a day (BID) | ORAL | Status: AC
  Administered 2024-01-07 – 2024-01-08 (×2): 1 mg via ORAL
  Filled 2024-01-04 (×2): qty 1

## 2024-01-04 MED ORDER — LORAZEPAM 1 MG PO TABS: 1.0000 mg | ORAL_TABLET | ORAL | Status: DC | PRN

## 2024-01-04 NOTE — Discharge Instructions (Signed)
 Your EKG showed QTc prolongation which is in a prolonged interval and your heart rhythm.  This sometimes can be caused from electrolyte abnormalities and/or medications.  Since you are not taking any home medications we know this is not the case.  Your electrolytes are stable.  Her cardiology team wants to follow-up with you in the next few weeks to repeat your EKG to check your QTc.  Usually this is something that can resolve on its own.  Please return back to [facility] for alcohol detox and follow-up with cardiology on outpatient basis.

## 2024-01-04 NOTE — Discharge Instructions (Addendum)
 Transfer to [facility]

## 2024-01-04 NOTE — ED Provider Notes (Addendum)
 Behavioral Health Urgent Care Medical Screening Exam  Patient Name: Chad Tanner MRN: [id number] Date of Evaluation: 01/04/24 Chief Complaint:  requesting alcohol detoxification  Diagnosis:  Final diagnoses:  Alcohol use    History of Present illness: Chad Tanner is a 33 y.o. male patient with a past psychiatric history of alcohol use disorder who presented to the [facility] voluntarily on 01/04/24 requesting alcohol detoxification.   Patient was accepted to [facility] September 11, [facility] abuse [facility]. He reports that he was discharged from [facility] on October 10, 2023. He reports a sobriety of 1 month and states that he relapsed one month ago. He reports drinking alcohol everyday for the past month. On average, a 1/2 gallon of liquor along with 3 (25 oz) beers daily. He states that he last consumed alcohol around 12 AM and drank 3 beers. He denies alcohol withdrawal symptoms at this time. He denies a history of alcohol withdrawal seizures or DT's. He reports history of one inpatient substance abuse rehabilitation program at [facility] in July-Aug (2025). He reports longest period of sobriety 1 to 2 years ago for 2 to 3 months. He denies using illicit drugs. He is currently interested in going to a substance abuse rehabilitation program after he completes detoxification.  He reports feeling depressed since August that he describes as feeling sad every day and decreased energy mentally and physically. He denies depressive symptoms of hopelessness, worthlessness, isolating, crying spells, decreased motivation, anhedonia or irritability. He reports having a poor appetite and difficulty sleeping for the past month due to drinking alcohol everyday. He reports possibly losing 8 pounds since August (2025). He denies suicidal thoughts. He denies a history of suicide attempts. He denies homicidal thoughts. He denies auditory or  visual hallucinations. He denies paranoia. Objectively, no signs of acute psychosis.  He currently resides with a friend. He has an 73 year old child who does not live with him. 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 He denies legal issues.  He denies a past psychiatric history other than alcohol use. He denies past inpatient psychiatric hospitalizations for mental health other than alcohol detoxification treatment. He denies outpatient psychiatry or counseling. He denies a medical history. He denies taking prescribed medications. He denies physical complaints on exam. He reports a family substance abuse history of mother, maternal cousins and maternal aunts and uncle history of alcoholism.  Patient was admitted to the [facility] continuous assessment unit and worked up medically for alcohol detoxification. However, his EKG showed prolonged QTc of 511 with T wave abnormality and repeat EKG showed QTc prolongation of 524 with T wave abnormality. CMP pending results. UDS negative. Vital signs are stable. Patient is asymptomatic. Patient denies a history of chest pain. Patient denies a cardiac history. He denies taking medications including psychotropic medications. Patient has no awareness of past abnormal EKGs. Although, previous EKGs indicate prolonged QTc and abnormal T waves. Patient's case discussed with Attending Dr. Lawrnce. Patient to be sent to [facility] emergency department for medical/cardiac workup. Report called to Dr. Gerlean at [facility] emergency department. Patient may return back to the [facility] once medically stable.   Flowsheet Row ED from 01/04/2024 in [facility] Most recent reading at 01/04/2024  9:27 AM ED from 09/06/2023 in [facility] Most  recent reading at 09/06/2023  6:36 AM ED from  09/06/2023 in [facility] Most recent reading at 09/06/2023  2:32 AM  C-SSRS RISK CATEGORY No Risk No Risk No Risk    Psychiatric Specialty Exam  Presentation  General Appearance:Appropriate for Environment  Eye Contact:Fair  Speech:Clear and Coherent  Speech Volume:Normal  Handedness:Right   Mood and Affect  Mood: Dysphoric  Affect: Congruent   Thought Process  Thought Processes: Coherent; Goal Directed  Descriptions of Associations:Intact  Orientation:Full (Time, Place and Person)  Thought Content:Logical  Diagnosis of Schizophrenia or Schizoaffective disorder in past: No   Hallucinations:None  Ideas of Reference:None  Suicidal Thoughts:No  Homicidal Thoughts:No   Sensorium  Memory: Immediate Fair; Recent Fair; Remote Fair  Judgment: Fair  Insight: Fair   Art Therapist  Concentration: Fair  Attention Span: Fair  Recall: [employer] of Knowledge: Fair  Language: Fair   Psychomotor Activity  Psychomotor Activity: Normal   Assets  Assets: Manufacturing Systems Engineer; Desire for Improvement; Leisure Time; Physical Health   Sleep  Sleep: Poor  Number of hours:  6   Physical Exam: Physical Exam Cardiovascular:     Rate and Rhythm: Normal rate.  Pulmonary:     Effort: Pulmonary effort is normal.  Musculoskeletal:        General: Normal range of motion.     Cervical back: Normal range of motion.  Neurological:     Mental Status: He is alert and oriented to person, place, and time.    Review of Systems  Constitutional: Negative.   HENT: Negative.    Eyes: Negative.   Respiratory: Negative.    Cardiovascular: Negative.   Gastrointestinal: Negative.   Genitourinary: Negative.   Musculoskeletal: Negative.   Neurological: Negative.   Psychiatric/Behavioral:  Positive for substance abuse.    Blood pressure 126/88, pulse 89, temperature 98.5 F (36.9 C), temperature source Oral, resp.  rate 16, SpO2 98%. There is no height or weight on file to calculate BMI.  Musculoskeletal: Strength & Muscle Tone: within normal limits Gait & Station: normal Patient leans: N/A   [facility] MSE Discharge Disposition for Follow up and Recommendations: Based on my evaluation the patient appears to have an emergency medical condition for which I recommend the patient be transferred to the emergency department for further evaluation.   Patient was admitted to the [facility] continuous assessment unit and worked up medically for alcohol detoxification. However, his EKG showed prolonged QTc of 511 with T wave abnormality and repeat EKG showed QTc prolongation of 524 with T wave abnormality. CMP pending results. UDS negative. Vital signs are stable. Patient is asymptomatic. Patient denies a history of chest pain. Patient denies a cardiac history. He denies taking medications including psychotropic medications. Patient has no awareness of past abnormal EKGs. Although, previous EKGs indicate prolonged QTc and abnormal T waves. Patient's case discussed with Attending Dr. Lawrnce. Patient to be sent to [facility] emergency department for medical/cardiac workup. Report called to Dr. Gerlean at [facility] emergency department. Patient may return back to the [facility]gent [facility] once medically stable.  Patient is recommended for inpatient psychiatric treatment at the [facility] for alcohol detoxification once medically stable.   Medications regimen: Start Librium  taper 25 mg for AUD  PRN medications: acetaminophen , alum & mag hydroxide-simeth, chlordiazePOXIDE , haloperidol  **AND** diphenhydrAMINE , haloperidol  lactate **AND** diphenhydrAMINE  **AND** LORazepam , haloperidol  lactate **AND** diphenhydrAMINE  **AND** LORazepam , hydrOXYzine , loperamide , magnesium  hydroxide, ondansetron , traZODone   Labs Lab Orders         CBC with  Differential/Platelet         Comprehensive metabolic panel         Hemoglobin A1c         Ethanol         Lipid panel         TSH         POCT Urine Drug Screen - (I-Screen)    EKG  Patient declined STD screening.   Geneve Kimpel L, NP 01/04/2024, 10:43 AM

## 2024-01-04 NOTE — Group Note (Signed)
 Group Topic: Healthy Self Image and Positive Change  Group Date: 01/04/2024 Start Time: 2030 End Time: 2100 Facilitators: Anice Benton LABOR, NT  Department: [facility]  Number of Participants: 6  Group Focus: affirmation, clarity of thought, and communication Treatment Modality:  Cognitive Behavioral Therapy and Patient-Centered Therapy Interventions utilized were group exercise and support Purpose: express feelings, increase insight, and regain self-worth  Name: Chad Tanner Date of Birth: 1990-06-01  MR: [id number]    Level of Participation: Did Not Attend  Quality of Participation: N/A Interactions with others: N/A Mood/Affect: N/A Triggers (if applicable): N/A Cognition: N/A Progress: None Response: N/A Plan: patient will be encouraged to attend groups  Patients Problems:  Patient Active Problem List   Diagnosis Date Noted   Alcohol use disorder 09/06/2023   Alcohol abuse 11/15/2022   Alcohol dependence (HCC) 05/01/2013   Depression 05/01/2013

## 2024-01-04 NOTE — ED Provider Notes (Signed)
 Patient was transferred to the [facility] emergency department via nonemergent due to prolonged QTc. He returned from the [facility] emergency department via taxicab with no report called from the [facility] emergency department.   Per Dr. Elnor note: I spoke with our on-call cardiology team who states as long as the above findings are true, patient is safe to be discharged and follow-up outpatient.  Patient safe to go back to [facility] for alcohol detox. Will get him a cab [name] since he came by EMS.  Patient in no distress and overall condition improved here in the ED. Detailed discussions were had with the patient regarding current findings, and need for close f/u with PCP or on call doctor. The patient has been instructed to return immediately if the symptoms worsen in any way for re-evaluation. Patient verbalized understanding and is in agreement with current care plan. All questions answered prior to discharge.   On reevaluation, patient is seated eating supper in no acute distress. He is alert and oriented x 4. No SI/HI/AVH. Objectively, no signs of acute psychosis. He denies physical pain. He denies alcohol withdrawal. Patient to be admitted to the Facility [facility] for alcohol detoxification this evening, 11/16.

## 2024-01-04 NOTE — ED Provider Notes (Signed)
 [place] EMERGENCY DEPARTMENT AT [facility] Provider Note   CSN: [id number] Arrival date & time: 01/04/24  1312     Patient presents with: Abnormal ECG   Chad Tanner is a 33 y.o. male.   Patient is a 33 year old male with alcohol use disorder who presented to behavioral health urgent care for alcohol detox and he was found to have a prolonged QTc on EKG.  QTc 509.  Patient denies any chest pain, palpitations, or shortness of breath.  He is not currently taking any medications at home.  Was previously sober but states he has relapsed over the past month.  States he drinks approximately a 1/2 gallon of liquor along with 3 (25 oz) beers daily.   The history is provided by the patient. No language interpreter was used.       Prior to Admission medications   Not on File    Allergies: Patient has no known allergies.    Review of Systems  Constitutional:  Negative for chills and fever.  HENT:  Negative for ear pain and sore throat.   Eyes:  Negative for pain and visual disturbance.  Respiratory:  Negative for cough and shortness of breath.   Cardiovascular:  Negative for chest pain and palpitations.  Gastrointestinal:  Negative for abdominal pain and vomiting.  Genitourinary:  Negative for dysuria and hematuria.  Musculoskeletal:  Negative for arthralgias and back pain.  Skin:  Negative for color change and rash.  Neurological:  Negative for seizures and syncope.  All other systems reviewed and are negative.   Updated Vital Signs BP (!) 138/107   Pulse 62   Temp 98.7 F (37.1 C) (Oral)   Resp 16   SpO2 100%   Physical Exam Vitals and nursing note reviewed.  Constitutional:      General: He is not in acute distress.    Appearance: He is well-developed.  HENT:     Head: Normocephalic and atraumatic.  Eyes:     Conjunctiva/sclera: Conjunctivae normal.  Cardiovascular:     Rate and Rhythm: Normal rate and regular rhythm.     Heart sounds: No murmur  heard. Pulmonary:     Effort: Pulmonary effort is normal. No respiratory distress.     Breath sounds: Normal breath sounds.  Abdominal:     Palpations: Abdomen is soft.     Tenderness: There is no abdominal tenderness.  Musculoskeletal:        General: No swelling.     Cervical back: Neck supple.  Skin:    General: Skin is warm and dry.     Capillary Refill: Capillary refill takes less than 2 seconds.  Neurological:     Mental Status: He is alert.  Psychiatric:        Mood and Affect: Mood normal.     (all labs ordered are listed, but only abnormal results are displayed) Labs Reviewed  CBC WITH DIFFERENTIAL/PLATELET - Abnormal; Notable for the following components:      Result Value   RDW 16.0 (*)    All other components within normal limits  COMPREHENSIVE METABOLIC PANEL WITH GFR - Abnormal; Notable for the following components:   AST 60 (*)    All other components within normal limits  MAGNESIUM   ETHANOL  RAPID URINE DRUG SCREEN, HOSP PERFORMED    EKG: None  Radiology: No results found.   Procedures   Medications Ordered in the ED  LORazepam  (ATIVAN ) tablet 1-4 mg (1 mg Oral Given 01/04/24 1404)  Or  LORazepam  (ATIVAN ) injection 1-4 mg ( Intravenous See Alternative 01/04/24 1404)  thiamine  (VITAMIN B1) tablet 100 mg (100 mg Oral Given 01/04/24 1404)    Or  thiamine  (VITAMIN B1) injection 100 mg ( Intravenous See Alternative 01/04/24 1404)  folic acid (FOLVITE) tablet 1 mg (1 mg Oral Given 01/04/24 1404)  multivitamin with minerals tablet 1 tablet (1 tablet Oral Given 01/04/24 1404)                                    Medical Decision Making  33 year old male with alcohol use disorder who presented to behavioral health urgent care for alcohol detox and he was found to have a prolonged QTc on EKG at 509.  Patient is alert oriented x 3, no acute distress, afebrile, see vital signs.  Physical exam demonstrates no concerning findings.  Ethanol  negative  QTc prolongation: - No hypo or hyperkalemia - No hypo or hypercalcemia - Patient states that he is not currently taking any medications at the house. - Chart review demonstrates no other past medical history outside of depression and alcohol use disorder.  I spoke with our on-call cardiology team who states as long as the above findings are true, patient is safe to be discharged and follow-up outpatient.  Patient safe to go back to [facility] for alcohol detox.  Will get him a cab Chad Tanner since he came by EMS.  Patient in no distress and overall condition improved here in the ED. Detailed discussions were had with the patient regarding current findings, and need for close f/u with PCP or on call doctor. The patient has been instructed to return immediately if the symptoms worsen in any way for re-evaluation. Patient verbalized understanding and is in agreement with current care plan. All questions answered prior to discharge.      Final diagnoses:  None    ED Discharge Orders     None          Elnor Bernarda SQUIBB, DO 01/04/24 1743

## 2024-01-04 NOTE — Progress Notes (Signed)
   01/04/24 0922  [facility] Triage Screening (Walk-ins at [facility] only)  What Is the Reason for Your Visit/Call Today? Chad Tanner 33y male presents to [facility] unaccompanied. PT states that he is an alcoholic. PT states that he drinks heavily everyday, beer and liquor. PT denies SI, HI, AVH and substance use. PT would like to detox from alcohol.  How Long Has This Been Causing You Problems? > than 6 months  Have You Recently Had Any Thoughts About Hurting Yourself? No  Are You Planning to Commit Suicide/Harm Yourself At This time? No  Have you Recently Had Thoughts About Hurting Someone Sherral? No  Are You Planning To Harm Someone At This Time? No  Physical Abuse Denies  Verbal Abuse Denies  Sexual Abuse Denies  Exploitation of patient/patient's resources Denies  Self-Neglect Yes, present (Comment) (Not sleeping or eating well)  Are you currently experiencing any auditory, visual or other hallucinations? No  Have You Used Any Alcohol or Drugs in the Past 24 Hours? Yes  What Did You Use and How Much? 3 pack of Natural Light (approximately 20oz)  Do you have any current medical co-morbidities that require immediate attention? No  Clinician description of patient physical appearance/behavior: calm, cooperative, oriented  What Do You Feel Would Help You the Most Today? Alcohol or Drug Use Treatment  Determination of Need Routine (7 days)  Options For Referral Outpatient Therapy;Intensive Outpatient Therapy;Facility-Based Crisis

## 2024-01-04 NOTE — ED Triage Notes (Signed)
 Patient from [facility] via EMS for eval of abnormal EKG while there. Patient without complaint. At [facility] for ETOH detox, states he drinks at least 3 tall boys and a half a pint of liquor daily. Last drink yesterday around 1700.

## 2024-01-04 NOTE — ED Provider Triage Note (Signed)
 Emergency Medicine Provider Triage Evaluation Note  Chad Tanner , a 33 y.o. male  was evaluated in triage.  Pt complains of need for alcohol detox.  Patient was sent from the behavioral health urgent care due to QTc prolongation on EKG.  He denies any symptoms at this time.  He drinks 3 tall boys and half a pint of liquor daily.  Last drink yesterday around 1700.  Review of Systems  Positive: None Negative: CP, SOB  Physical Exam  There were no vitals taken for this visit. Gen:   Awake, no distress   Resp:  Normal effort  MSK:   Moves extremities without difficulty  CV:  Well perfused  Medical Decision Making  Medically screening exam initiated at 1:23 PM.  Appropriate orders placed.  Chad Tanner was informed that the remainder of the evaluation will be completed by another provider, this initial triage assessment does not replace that evaluation, and the importance of remaining in the ED until their evaluation is complete.  Labs and EKG ordered for prolonged QTC noted on EKG   Jerrol Agent, MD 01/04/24 1325

## 2024-01-04 NOTE — ED Notes (Signed)
 Patient admitted to b[facility]uc. The patient appears appropriate for the environment, with fair eye contact. The thought process is coherent and goal-directed, with intact associations. The patient is fully oriented to time, place, and person, and their thought content is logical.  patient denies experiencing any hallucinations or ideas of reference. Suicidal and homicidal thoughts are not present. Pt offered snacks. Skin intact. Belongings in locker. Environment secured. Poc ongoing.
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
## 2024-01-04 NOTE — ED Notes (Signed)
 RN spoke with patient A&Ox4. Denies intent to harm self/others when asked. Denies A/VH or any physical complaints when asked. No acute distress noted. Active listening, support and encouragement provided. Routine safety checks conducted according to facility protocol. Encouraged patient to notify staff if thoughts of harm toward self or others arise. Patient verbalize understanding and agreement.Patient offered nutrition and oriented to unit

## 2024-01-04 NOTE — ED Provider Notes (Signed)
 Facility [facility] Crisis Admission H&P  Date: 01/04/24 Patient Name: Chad Tanner MRN: [id number] Chief Complaint: I want alcohol detox. I relapsed  Diagnoses:  Final diagnoses:  Alcohol dependence with withdrawal with complication (HCC)  Abnormal EKG  Alcohol-induced insomnia (HCC)  Alcohol-induced mood disorder (HCC)    HPI: Pt is reporting that he relapsed on alcohol after doing [facility] for 30 days. He has a history of his heart beating real fast when he stops drinking. He hallucinates sometimes when he is drinking but not in withdrawal and never otherwise. He reports that he has mild tremors from withdrawal currently and no other symptoms.  The hpi taken earlier by NP is as follows. Patient was accepted to [facility] 11, [facility]. He reports that he was discharged from [facility] on October 10, 2023. He reports a sobriety of 1 month and states that he relapsed one month ago. He reports drinking alcohol everyday for the past month. On average, a 1/2 gallon of liquor along with 3 (25 oz) beers daily. He states that he last consumed alcohol around 12 AM and drank 3 beers. He denies alcohol withdrawal symptoms at this time. He denies a history of alcohol withdrawal seizures or DT's. He reports history of one inpatient substance abuse rehabilitation program at [facility] in July-Aug (2025). He reports longest period of sobriety 1 to 2 years ago for 2 to 3 months. He denies using illicit drugs. He is currently interested in going to a substance abuse rehabilitation program after he completes detoxification.   He reports feeling depressed since August that he describes as feeling sad every day and decreased energy mentally and physically. He denies depressive symptoms of hopelessness, worthlessness, isolating, crying spells, decreased motivation, anhedonia or irritability. He reports having a poor appetite and difficulty sleeping for the past  month due to drinking alcohol everyday. He reports possibly losing 8 pounds since August (2025). He denies suicidal thoughts. He denies a history of suicide attempts. He denies homicidal thoughts. He denies auditory or visual hallucinations. He denies paranoia. Objectively, no signs of acute psychosis.   He currently resides with mother. He has an 18 year old child who does not live with him. 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 He denies legal issues.   He denies a past psychiatric history other than alcohol use. He denies past inpatient psychiatric hospitalizations for mental health other than alcohol detoxification treatment. He denies outpatient psychiatry or counseling. He denies a medical history. He denies taking prescribed medications. He denies physical complaints on exam. He reports a family substance abuse history of mother, maternal cousins and maternal aunts and uncle history of alcoholism.  PHQ 2-9:  Flowsheet Row ED from 09/06/2023 in [facility] ED from 11/15/2022 in [facility]  Thoughts that you would be better off dead, or of hurting yourself in some way Several days Not at all  PHQ-9 Total Score 9 10    Flowsheet Row ED from 01/04/2024 in [facility] Most recent reading at 01/04/2024  8:27 PM ED from 01/04/2024 in [facility] Emergency Department at [facility] Most recent reading at 01/04/2024  1:23 PM ED from 01/04/2024 in [facility] Most recent reading at 01/04/2024 11:24 AM  C-SSRS RISK CATEGORY No Risk No Risk No Risk  Screenings    Flowsheet Row Most Recent Value  CIWA-Ar Total 2    Total Time spent with patient: 20 minutes  Musculoskeletal  Strength & Muscle Tone: within normal limits Gait & Station: normal Patient leans: N/A  Psychiatric  Specialty Exam  Presentation General Appearance:  Appropriate for Environment  Eye Contact: Fair  Speech: Clear and Coherent  Speech Volume: Normal  Handedness: Right   Mood and Affect  Mood: Dysphoric  Affect: Congruent   Thought Process  Thought Processes: Coherent; Goal Directed  Descriptions of Associations:Intact  Orientation:Full (Time, Place and Person)  Thought Content:Logical  Diagnosis of Schizophrenia or Schizoaffective disorder in past: No   Hallucinations:Hallucinations: None  Ideas of Reference:None  Suicidal Thoughts:Suicidal Thoughts: No  Homicidal Thoughts:Homicidal Thoughts: No   Sensorium  Memory: Immediate Fair; Recent Fair; Remote Fair  Judgment: Fair  Insight: Fair   Art Therapist  Concentration: Fair  Attention Span: Fair  Recall: [employer] of Knowledge: Fair  Language: Fair   Psychomotor Activity  Psychomotor Activity: Psychomotor Activity: Normal   Assets  Assets: Communication Skills; Desire for Improvement; Leisure Time; Physical Health   Sleep  Sleep: Sleep: Poor   Nutritional Assessment (For OBS and FBC admissions only) Has the patient had a weight loss or gain of 10 pounds or more in the last 3 months?: No Has the patient had a decrease in food intake/or appetite?: Yes Does the patient have dental problems?: No Does the patient have eating habits or behaviors that may be indicators of an eating disorder including binging or inducing vomiting?: No Has the patient recently lost weight without trying?: 1 Has the patient been eating poorly because of a decreased appetite?: 1 Malnutrition Screening Tool Score: 2    Physical Exam Vitals reviewed.  Constitutional:      Appearance: Normal appearance.  HENT:     Head: Normocephalic and atraumatic.     Mouth/Throat:     Pharynx: Oropharynx is clear.  Eyes:     Conjunctiva/sclera: Conjunctivae normal.  Pulmonary:     Effort:  Pulmonary effort is normal.  Musculoskeletal:        General: Normal range of motion.     Cervical back: Normal range of motion.  Neurological:     Mental Status: He is alert and oriented to person, place, and time.    Review of Systems  Constitutional:  Negative for chills and fever.  HENT:  Negative for hearing loss.   Eyes:  Negative for blurred vision.  Respiratory:  Negative for cough.   Cardiovascular:  Negative for chest pain, palpitations, orthopnea and leg swelling.  Gastrointestinal:  Negative for heartburn, nausea and vomiting.  Genitourinary:  Negative for dysuria.  Skin:  Negative for rash.  Neurological:  Negative for dizziness, tingling and headaches.  Psychiatric/Behavioral:  Positive for depression and substance abuse. Negative for suicidal ideas. The patient is nervous/anxious and has insomnia.     Blood pressure (!) 138/96, pulse 74, temperature 98.4 F (36.9 C), temperature source Oral, resp. rate 20, SpO2 99%. There is no height or weight on file to calculate BMI.  Past Psychiatric History: Pt has done rehab for alcohol once before. No psych hospitalizations or suicide attempts. No other mental health diagnoses though he did take Mirtazapine and trazodone  and reports depressive symptoms and insomnia    Is the patient at risk to self? No  Has the patient been a risk to self in the past 6 months? No .    Has the  patient been a risk to self within the distant past? No   Is the patient a risk to others? No   Has the patient been a risk to others in the past 6 months? No   Has the patient been a risk to others within the distant past? No   Past Medical History: Patient denies any medical history. EKGs in the past have also been abnormal Family History: Mother with an irregular heart beat and insomnia. [employer] with a heart problem Social History: Single never marred HS graduate, 1 child age 58 does not live with him. Pt says he lives with mother but told another  provider that he lives with a friend.  Pt reports started drinking at age 6 longest period of sobriety was in treatment for 2 months. 1 treatment in the past.  Last Labs:  Admission on 01/04/2024, Discharged on 01/04/2024  Component Date Value Ref Range Status   WBC 01/04/2024 8.5  4.0 - 10.5 K/uL Final   RBC 01/04/2024 4.91  4.22 - 5.81 MIL/uL Final   Hemoglobin 01/04/2024 15.1  13.0 - 17.0 g/dL Final   HCT 88/83/7974 43.4  39.0 - 52.0 % Final   MCV 01/04/2024 88.4  80.0 - 100.0 fL Final   MCH 01/04/2024 30.8  26.0 - 34.0 pg Final   MCHC 01/04/2024 34.8  30.0 - 36.0 g/dL Final   RDW 88/83/7974 16.0 (H)  11.5 - 15.5 % Final   Platelets 01/04/2024 235  150 - 400 K/uL Final   nRBC 01/04/2024 0.0  0.0 - 0.2 % Final   Neutrophils Relative % 01/04/2024 76  % Final   Neutro Abs 01/04/2024 6.6  1.7 - 7.7 K/uL Final   Lymphocytes Relative 01/04/2024 17  % Final   Lymphs Abs 01/04/2024 1.4  0.7 - 4.0 K/uL Final   Monocytes Relative 01/04/2024 6  % Final   Monocytes Absolute 01/04/2024 0.5  0.1 - 1.0 K/uL Final   Eosinophils Relative 01/04/2024 0  % Final   Eosinophils Absolute 01/04/2024 0.0  0.0 - 0.5 K/uL Final   Basophils Relative 01/04/2024 1  % Final   Basophils Absolute 01/04/2024 0.0  0.0 - 0.1 K/uL Final   Immature Granulocytes 01/04/2024 0  % Final   Abs Immature Granulocytes 01/04/2024 0.02  0.00 - 0.07 K/uL Final   Performed at [facility], [address] [place], [place] [address]   Sodium 01/04/2024 138  135 - 145 mmol/L Final   Potassium 01/04/2024 4.4  3.5 - 5.1 mmol/L Final   Chloride 01/04/2024 99  98 - 111 mmol/L Final   CO2 01/04/2024 27  22 - 32 mmol/L Final   Glucose, Bld 01/04/2024 88  70 - 99 mg/dL Final   Glucose reference range applies only to samples taken after fasting for at least 8 hours.   BUN 01/04/2024 10  6 - 20 mg/dL Final   Creatinine, Ser 01/04/2024 1.17  0.61 - 1.24 mg/dL Final   Calcium 88/83/7974 9.3  8.9 - 10.3 mg/dL Final   Total Protein  01/04/2024 7.5  6.5 - 8.1 g/dL Final   Albumin 88/83/7974 4.2  3.5 - 5.0 g/dL Final   AST 88/83/7974 60 (H)  15 - 41 U/L Final   ALT 01/04/2024 17  0 - 44 U/L Final   Alkaline Phosphatase 01/04/2024 56  38 - 126 U/L Final   Total Bilirubin 01/04/2024 1.2  0.0 - 1.2 mg/dL Final   GFR, Estimated 01/04/2024 >60  >60 mL/min  Final   Comment: (NOTE) Calculated using the CKD-EPI Creatinine Equation (2021)    Anion gap 01/04/2024 12  5 - 15 Final   Performed at [facility], [address], [place], [place] [address]   Magnesium  01/04/2024 1.9  1.7 - 2.4 mg/dL Final   Performed at [facility], [address] [place], [place] [address]   Alcohol, Ethyl (B) 01/04/2024 <15  <15 mg/dL Final   Comment: (NOTE) For medical purposes only. Performed at [facility], [address], [place], [place] [address]   Admission on 01/04/2024, Discharged on 01/04/2024  Component Date Value Ref Range Status   WBC 01/04/2024 8.8  4.0 - 10.5 K/uL Final   RBC 01/04/2024 4.91  4.22 - 5.81 MIL/uL Final   Hemoglobin 01/04/2024 15.1  13.0 - 17.0 g/dL Final   HCT 88/83/7974 43.7  39.0 - 52.0 % Final   MCV 01/04/2024 89.0  80.0 - 100.0 fL Final   MCH 01/04/2024 30.8  26.0 - 34.0 pg Final   MCHC 01/04/2024 34.6  30.0 - 36.0 g/dL Final   RDW 88/83/7974 16.4 (H)  11.5 - 15.5 % Final   Platelets 01/04/2024 235  150 - 400 K/uL Final   nRBC 01/04/2024 0.0  0.0 - 0.2 % Final   Neutrophils Relative % 01/04/2024 81  % Final   Neutro Abs 01/04/2024 7.1  1.7 - 7.7 K/uL Final   Lymphocytes Relative 01/04/2024 12  % Final   Lymphs Abs 01/04/2024 1.1  0.7 - 4.0 K/uL Final   Monocytes Relative 01/04/2024 7  % Final   Monocytes Absolute 01/04/2024 0.6  0.1 - 1.0 K/uL Final   Eosinophils Relative 01/04/2024 0  % Final   Eosinophils Absolute 01/04/2024 0.0  0.0 - 0.5 K/uL Final   Basophils Relative 01/04/2024 0  % Final   Basophils Absolute 01/04/2024 0.0  0.0 - 0.1 K/uL Final   Immature Granulocytes  01/04/2024 0  % Final   Abs Immature Granulocytes 01/04/2024 0.02  0.00 - 0.07 K/uL Final   Performed at [facility], [address], [place], [place] [address]   Sodium 01/04/2024 140  135 - 145 mmol/L Final   Potassium 01/04/2024 4.3  3.5 - 5.1 mmol/L Final   Chloride 01/04/2024 101  98 - 111 mmol/L Final   CO2 01/04/2024 25  22 - 32 mmol/L Final   Glucose, Bld 01/04/2024 64 (L)  70 - 99 mg/dL Final   Glucose reference range applies only to samples taken after fasting for at least 8 hours.   BUN 01/04/2024 10  6 - 20 mg/dL Final   Creatinine, Ser 01/04/2024 0.98  0.61 - 1.24 mg/dL Final   Calcium 88/83/7974 9.0  8.9 - 10.3 mg/dL Final   Total Protein 88/83/7974 7.4  6.5 - 8.1 g/dL Final   Albumin 88/83/7974 4.3  3.5 - 5.0 g/dL Final   AST 88/83/7974 60 (H)  15 - 41 U/L Final   ALT 01/04/2024 17  0 - 44 U/L Final   Alkaline Phosphatase 01/04/2024 56  38 - 126 U/L Final   Total Bilirubin 01/04/2024 1.4 (H)  0.0 - 1.2 mg/dL Final   GFR, Estimated 01/04/2024 >60  >60 mL/min Final   Comment: (NOTE) Calculated using the CKD-EPI Creatinine Equation (2021)    Anion gap 01/04/2024 14  5 - 15 Final   Performed at [facility], [address], [place], [place] [address]   Hgb A1c MFr Bld  01/04/2024 5.5  4.8 - 5.6 % Final   Comment: (NOTE) Diagnosis of Diabetes The following HbA1c ranges recommended by the American Diabetes Association (ADA) may be used as an aid in the diagnosis of diabetes mellitus.  Hemoglobin             Suggested A1C NGSP%              Diagnosis  <5.7                   Non Diabetic  5.7-6.4                Pre-Diabetic  >6.4                   Diabetic  <7.0                   Glycemic control for                       adults with diabetes.     Mean Plasma Glucose 01/04/2024 111.15  mg/dL Final   Performed at [facility], [address] [place], [place] [address]   Alcohol, Ethyl (B) 01/04/2024 55 (H)  <15 mg/dL Final   Comment: (NOTE) For  medical purposes only. Performed at [facility], [address] [place], [place] [address]    Cholesterol 01/04/2024 223 (H)  0 - 200 mg/dL Final   Triglycerides 88/83/7974 38  <150 mg/dL Final   HDL 88/83/7974 124  >40 mg/dL Final   Total CHOL/HDL Ratio 01/04/2024 1.8  RATIO Final   VLDL 01/04/2024 8  0 - 40 mg/dL Final   LDL Cholesterol 01/04/2024 91  0 - 99 mg/dL Final   Comment:        Total Cholesterol/HDL:CHD Risk Coronary Heart Disease Risk Table                     Men   Women  1/2 Average Risk   3.4   3.3  Average Risk       5.0   4.4  2 X Average Risk   9.6   7.1  3 X Average Risk  23.4   11.0        Use the calculated Patient Ratio above and the CHD Risk Table to determine the patient's CHD Risk.        ATP III CLASSIFICATION (LDL):  <100     mg/dL   Optimal  899-870  mg/dL   Near or Above                    Optimal  130-159  mg/dL   Borderline  839-810  mg/dL   High  >809     mg/dL   Very High Performed at [facility], [address] [place], [place] [address]    TSH 01/04/2024 0.392  0.350 - 4.500 uIU/mL Final   Comment: Performed by a 3rd Generation assay with a functional sensitivity of <=0.01 uIU/mL. Performed at [facility], [address], [place], [place] [address]    POC Amphetamine UR 01/04/2024 None Detected  NONE DETECTED (Cut Off Level 1000 ng/mL) Final   POC Secobarbital (BAR) 01/04/2024 None Detected  NONE DETECTED (Cut Off Level 300 ng/mL) Final   POC Buprenorphine (BUP) 01/04/2024 None Detected  NONE DETECTED (Cut Off Level 10 ng/mL) Final   POC Oxazepam (BZO) 01/04/2024  None Detected  NONE DETECTED (Cut Off Level 300 ng/mL) Final   POC Cocaine UR 01/04/2024 None Detected  NONE DETECTED (Cut Off Level 300 ng/mL) Final   POC Methamphetamine UR 01/04/2024 None Detected  NONE DETECTED (Cut Off Level 1000 ng/mL) Final   POC Morphine 01/04/2024 None Detected  NONE DETECTED (Cut Off Level 300 ng/mL) Final   POC Methadone UR  01/04/2024 None Detected  NONE DETECTED (Cut Off Level 300 ng/mL) Final   POC Oxycodone  UR 01/04/2024 None Detected  NONE DETECTED (Cut Off Level 100 ng/mL) Final   POC Marijuana UR 01/04/2024 None Detected  NONE DETECTED (Cut Off Level 50 ng/mL) Final  Admission on 09/06/2023, Discharged on 09/06/2023  Component Date Value Ref Range Status   WBC 09/06/2023 7.6  4.0 - 10.5 K/uL Final   RBC 09/06/2023 4.46  4.22 - 5.81 MIL/uL Final   Hemoglobin 09/06/2023 13.9  13.0 - 17.0 g/dL Final   HCT 92/80/7974 39.7  39.0 - 52.0 % Final   MCV 09/06/2023 89.0  80.0 - 100.0 fL Final   MCH 09/06/2023 31.2  26.0 - 34.0 pg Final   MCHC 09/06/2023 35.0  30.0 - 36.0 g/dL Final   RDW 92/80/7974 14.2  11.5 - 15.5 % Final   Platelets 09/06/2023 198  150 - 400 K/uL Final   nRBC 09/06/2023 0.0  0.0 - 0.2 % Final   Neutrophils Relative % 09/06/2023 60  % Final   Neutro Abs 09/06/2023 4.6  1.7 - 7.7 K/uL Final   Lymphocytes Relative 09/06/2023 34  % Final   Lymphs Abs 09/06/2023 2.6  0.7 - 4.0 K/uL Final   Monocytes Relative 09/06/2023 5  % Final   Monocytes Absolute 09/06/2023 0.4  0.1 - 1.0 K/uL Final   Eosinophils Relative 09/06/2023 0  % Final   Eosinophils Absolute 09/06/2023 0.0  0.0 - 0.5 K/uL Final   Basophils Relative 09/06/2023 1  % Final   Basophils Absolute 09/06/2023 0.1  0.0 - 0.1 K/uL Final   Immature Granulocytes 09/06/2023 0  % Final   Abs Immature Granulocytes 09/06/2023 0.01  0.00 - 0.07 K/uL Final   Performed at [facility], [address], [place], [place] [address]   Sodium 09/06/2023 142  135 - 145 mmol/L Final   Potassium 09/06/2023 3.7  3.5 - 5.1 mmol/L Final   Chloride 09/06/2023 105  98 - 111 mmol/L Final   CO2 09/06/2023 22  22 - 32 mmol/L Final   Glucose, Bld 09/06/2023 93  70 - 99 mg/dL Final   Glucose reference range applies only to samples taken after fasting for at least 8 hours.   BUN 09/06/2023 8  6 - 20 mg/dL Final   Creatinine, Ser 09/06/2023 1.03  0.61 - 1.24  mg/dL Final   Calcium 92/80/7974 9.1  8.9 - 10.3 mg/dL Final   Total Protein 92/80/7974 7.3  6.5 - 8.1 g/dL Final   Albumin 92/80/7974 4.4  3.5 - 5.0 g/dL Final   AST 92/80/7974 76 (H)  15 - 41 U/L Final   ALT 09/06/2023 37  0 - 44 U/L Final   Alkaline Phosphatase 09/06/2023 49  38 - 126 U/L Final   Total Bilirubin 09/06/2023 0.9  0.0 - 1.2 mg/dL Final   GFR, Estimated 09/06/2023 >60  >60 mL/min Final   Comment: (NOTE) Calculated using the CKD-EPI Creatinine Equation (2021)    Anion gap 09/06/2023 15  5 - 15 Final   Performed at [facility], [address] [place], [place] [address]   Alcohol, Ethyl (B) 09/06/2023 198 (H)  <15 mg/dL Final   Comment: (NOTE) For medical purposes only. Performed at [facility], [address], [place], [place] [address]    POC Amphetamine UR 09/06/2023 None Detected  NONE DETECTED (Cut Off Level 1000 ng/mL) Final   POC Secobarbital (BAR) 09/06/2023 None Detected  NONE DETECTED (Cut Off Level 300 ng/mL) Final   POC Buprenorphine (BUP) 09/06/2023 None Detected  NONE DETECTED (Cut Off Level 10 ng/mL) Final   POC Oxazepam (BZO) 09/06/2023 None Detected  NONE DETECTED (Cut Off Level 300 ng/mL) Final   POC Cocaine UR 09/06/2023 None Detected  NONE DETECTED (Cut Off Level 300 ng/mL) Final   POC Methamphetamine UR 09/06/2023 None Detected  NONE DETECTED (Cut Off Level 1000 ng/mL) Final   POC Morphine 09/06/2023 None Detected  NONE DETECTED (Cut Off Level 300 ng/mL) Final   POC Methadone UR 09/06/2023 None Detected  NONE DETECTED (Cut Off Level 300 ng/mL) Final   POC Oxycodone  UR 09/06/2023 None Detected  NONE DETECTED (Cut Off Level 100 ng/mL) Final   POC Marijuana UR 09/06/2023 None Detected  NONE DETECTED (Cut Off Level 50 ng/mL) Final   Magnesium  09/06/2023 1.9  1.7 - 2.4 mg/dL Final   Performed at [facility], [address], [place],  [address]    Allergies: Patient has no known allergies.  Medications:  Facility Ordered  Medications  Medication   acetaminophen  (TYLENOL ) tablet 650 mg   alum & mag hydroxide-simeth (MAALOX/MYLANTA) 200-200-20 MG/5ML suspension 30 mL   magnesium  hydroxide (MILK OF MAGNESIA) suspension 30 mL   [START ON 01/05/2024] thiamine  (VITAMIN B1) tablet 100 mg   [START ON 01/05/2024] multivitamin with minerals tablet 1 tablet   LORazepam  (ATIVAN ) tablet 1 mg   loperamide  (IMODIUM ) capsule 2-4 mg   melatonin tablet 3 mg   magnesium  oxide (MAG-OX) tablet 200 mg   LORazepam  (ATIVAN ) tablet 2 mg   Followed by   NOREEN ON 01/06/2024] LORazepam  (ATIVAN ) tablet 1 mg   Followed by   NOREEN ON 01/07/2024] LORazepam  (ATIVAN ) tablet 1 mg   Followed by   NOREEN ON 01/09/2024] LORazepam  (ATIVAN ) tablet 1 mg   LORazepam  (ATIVAN ) tablet 1 mg   LORazepam  (ATIVAN ) injection 1 mg    Long Term Goals: Improvement in symptoms so as ready for discharge  Short Term Goals: Patient will verbalize feelings in meetings with treatment team members., Patient will attend at least of 50% of the groups daily., and Patient will participate in completing the [place] Suicide Severity Rating Scale  Medical Decision Making  33 year old male with a family history of anxiety and personal history of starting alcohol at age 55 and drinking heavily. He successfully completed rehabilitation program but relapsed in the context of leaving treatment. He may have underlying mood/anxiety disorder driving alcohol use disorder. Alcohol is interfering with his ability to work. He has an abnormal EKG and was recommended to closely follow with cardiology limiting his mediation options. Alcohol may have an impact on Qtc and increase risk for a cardiac event.   Alcohol withdrawal CIWA Ativan  taper and for breakthrough symptoms prn  Agitation protocol After consultation with ED and with Dr. Jason  Ativan  is the only medication for agitation ordered  Insomnia Melatonin for sleep Trazodone  causes increase Qtc did not  order.  PRN meds were ordered but meds that increase Qtc were not ordered.   SW to coordinate placement in  residential.  Mood Monitor mood Consider if any antidepressant is safe in this context of elevated Qtc of unknown cause.SABRA     Recommendations  Pt is here for treatment to prevent alcohol withdrawal. He was sent to the ED due to an abnormal EKG and the recommendation was to not give any medication that increases Qtc.   Garvin JINNY Gaines, MD 01/04/24  9:58 PM

## 2024-01-04 NOTE — ED Notes (Signed)
 Pt dcd to [facility] ed vue to prolonged qtc

## 2024-01-05 DIAGNOSIS — F10282 Alcohol dependence with alcohol-induced sleep disorder: Secondary | ICD-10-CM | POA: Diagnosis not present

## 2024-01-05 DIAGNOSIS — R9431 Abnormal electrocardiogram [ECG] [EKG]: Secondary | ICD-10-CM

## 2024-01-05 DIAGNOSIS — F1094 Alcohol use, unspecified with alcohol-induced mood disorder: Secondary | ICD-10-CM

## 2024-01-05 DIAGNOSIS — F10982 Alcohol use, unspecified with alcohol-induced sleep disorder: Secondary | ICD-10-CM

## 2024-01-05 DIAGNOSIS — F1024 Alcohol dependence with alcohol-induced mood disorder: Secondary | ICD-10-CM | POA: Diagnosis not present

## 2024-01-05 DIAGNOSIS — F10239 Alcohol dependence with withdrawal, unspecified: Secondary | ICD-10-CM | POA: Diagnosis not present

## 2024-01-05 MED ORDER — MIRTAZAPINE 15 MG PO TABS
15.0000 mg | ORAL_TABLET | Freq: Every day | ORAL | Status: DC
  Administered 2024-01-05 – 2024-01-08 (×4): 15 mg via ORAL
  Filled 2024-01-05 (×4): qty 1

## 2024-01-05 MED ORDER — NALTREXONE HCL 50 MG PO TABS
50.0000 mg | ORAL_TABLET | ORAL | Status: DC
  Administered 2024-01-06 – 2024-01-09 (×4): 50 mg via ORAL
  Filled 2024-01-05 (×4): qty 1

## 2024-01-05 MED ORDER — MELATONIN 5 MG PO TABS
5.0000 mg | ORAL_TABLET | Freq: Every day | ORAL | Status: DC
  Administered 2024-01-05 – 2024-01-08 (×4): 5 mg via ORAL
  Filled 2024-01-05 (×4): qty 1

## 2024-01-05 MED ORDER — CLONIDINE HCL 0.1 MG PO TABS
0.1000 mg | ORAL_TABLET | Freq: Once | ORAL | Status: AC
  Administered 2024-01-05: 0.1 mg via ORAL
  Filled 2024-01-05: qty 1

## 2024-01-05 NOTE — ED Notes (Signed)
 Pt reports to feel 'alright'. Pt denies si hi and avh- verbal contract for safety provided. Pt ate breakfast, denies physical pain and discomfort. Medications reviewed, questions denied.

## 2024-01-05 NOTE — Care Management (Signed)
 [facility] Care Management   [facility] has requested the patient to complete a phone intake interview. [phone].  Writer left a HIPAA compliant voicemail message with [facility] to follow up on his referral.

## 2024-01-05 NOTE — Care Management (Signed)
 [facility] Care Management...  Writer met with patient to discuss discharge planning.  Patient reported excessive alcohol consumption and wanting to detox and find inpatient treatment for over 30 days.   Patient reported not sure of Scientist, Physiological will refer patient to [facility] and [facility]

## 2024-01-05 NOTE — ED Notes (Signed)
 Patient sleeping with no S/S of distress

## 2024-01-05 NOTE — ED Notes (Signed)
Patient sleeping with no s/s of distress.

## 2024-01-05 NOTE — ED Notes (Signed)
 Pt denies s/s of headache. Pt informed to notify saff if headache develops.

## 2024-01-05 NOTE — ED Notes (Signed)
 RN discussed medications, questions denied. Pt continues to present as guarded, cooperative. Provider notified about elevated BP and c/o of poor sleep.

## 2024-01-05 NOTE — ED Provider Notes (Signed)
 Behavioral Health Progress Note  Date and Time: 01/05/2024 3:52 PM Name: Chad Tanner MRN:  [id number]  Subjective:  Patient reports feeling okay today. Sleep is claimed to be good but earlier patient noted to Nursing an issue. Patient clarified that melatonin/trazodone  were somewhat helpful but he still slept only 2-3hrs at a time. He is open to increasing melatonin to 6mg . Mirtazapine outpatient was helpful for mood/anxiety but he stopped taking it and combined with drinking, he lost track of how beneficial it was. He is open to resumption. Naltrexone  outpatient was also helpful. Again patient open to resumption to reduce alcohol intake/cravings. Primary craving today is cigarettes. He smokes 1.5ppd and typically uses the 14mg  patch. He requests 21mg  patch and PRN lozenge.   Diagnosis:  Final diagnoses:  Alcohol dependence with withdrawal with complication (HCC)  Abnormal EKG  Alcohol-induced insomnia (HCC)  Alcohol-induced mood disorder (HCC)   Total Time spent with patient: I personally spent 30 minutes on the unit in direct patient care. The direct patient care time included face-to-face time with the patient, reviewing the patient's chart, communicating with other professionals, and coordinating care. Greater than 50% of this time was spent in counseling or coordinating care with the patient regarding goals of hospitalization, psycho-education, and discharge planning needs.   On my assessment the patient denied SI, HI, AVH, paranoia, ideas of reference, or first rank symptoms. Patient denied drug cravings (except nicotine ) or active signs of withdrawal. Patient denied medication side-effects. Patient was not deemed to be a danger to self or others.   Admission HPI: Pt is reporting that he relapsed on alcohol after doing [facility] for 30 days. He has a history of tachycardia when he stops drinking. He hallucinates sometimes when he is drinking but not in withdrawal and never otherwise. He  reports that he has mild tremors from withdrawal currently and no other symptoms.   Patient [facility]. He reports that he was discharged from [facility] on October 10, 2023. He reports a sobriety of 1 month and states that he relapsed one month ago. He reports drinking alcohol everyday for the past month. On average, a 1/2 gallon of liquor along with 3 (25 oz) beers daily. He states that he last consumed alcohol around 12 AM and drank 3 beers. He denies alcohol withdrawal symptoms at this time. He denies a history of alcohol withdrawal seizures or DT's. He reports history of one inpatient substance abuse rehabilitation program at [facility] in July-Aug (2025). He reports longest period of sobriety 1 to 2 years ago for 2 to 3 months. He denies using illicit drugs. He is currently interested in going to a substance abuse rehabilitation program after he completes detoxification.   He reports feeling depressed since August that he describes as feeling sad every day and decreased energy mentally and physically. He denies depressive symptoms of hopelessness, worthlessness, isolating, crying spells, decreased motivation, anhedonia or irritability. He reports having a poor appetite and difficulty sleeping for the past month due to drinking alcohol everyday. He reports possibly losing 8 pounds since August (2025). He denies suicidal thoughts. He denies a history of suicide attempts. He denies homicidal thoughts. He denies auditory or visual hallucinations. He denies paranoia. Objectively, no signs of acute psychosis.   He currently resides with mother. He has an 55 year old child who does not live with him. 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 He denies legal issues.   He denies a past psychiatric history other than alcohol use. He  denies past inpatient psychiatric hospitalizations for mental health other than alcohol detoxification treatment. He denies outpatient psychiatry or counseling. He denies a medical history. He denies taking prescribed medications. He denies physical complaints on exam. He reports a family substance abuse history of mother, maternal cousins and maternal aunts and uncle history of alcoholism.  Past Psychiatric History: Pt has done rehab for alcohol once before. No psych hospitalizations or suicide attempts. No other mental health diagnoses though he did take mirtazapine, naltrexone , and trazodone  and reports depressive symptoms and insomnia  Past Medical History: Patient denies any medical history. EKG h/o abnormal Family History: Mother with an irregular heart beat and insomnia. [employer] with a heart problem Social History: Single never marred HS graduate, 1 child age 39 does not live with him. Pt says he lives with mother but told another provider that he lives with a friend.  Pt reports started drinking at age 59 longest period of sobriety was in treatment for 2 months. 1 treatment in the past.  Sleep: Poor  Appetite:  Fair  Current Medications:  Current Facility-Administered Medications  Medication Dose Route Frequency Provider Last Rate Last Admin   acetaminophen  (TYLENOL ) tablet 650 mg  650 mg Oral Q6H PRN White, Patrice L, NP       alum & mag hydroxide-simeth (MAALOX/MYLANTA) 200-200-20 MG/5ML suspension 30 mL  30 mL Oral Q4H PRN White, Patrice L, NP       loperamide  (IMODIUM ) capsule 2-4 mg  2-4 mg Oral PRN White, Patrice L, NP       LORazepam  (ATIVAN ) injection 1 mg  1 mg Intramuscular Continuous PRN Gottfried, Rhoda J, MD       LORazepam  (ATIVAN ) tablet 1 mg  1 mg Oral Q6H PRN White, Patrice L, NP       LORazepam  (ATIVAN ) tablet 2 mg  2 mg Oral QID Gottfried, Rhoda J, MD   2 mg at 01/05/24 1448   Followed by   NOREEN ON 01/06/2024] LORazepam  (ATIVAN ) tablet 1 mg  1 mg Oral TID  Gottfried, Rhoda J, MD       Followed by   NOREEN ON 01/07/2024] LORazepam  (ATIVAN ) tablet 1 mg  1 mg Oral BID Gottfried, Rhoda J, MD       Followed by   NOREEN ON 01/09/2024] LORazepam  (ATIVAN ) tablet 1 mg  1 mg Oral Daily Gottfried, Rhoda J, MD       LORazepam  (ATIVAN ) tablet 1 mg  1 mg Oral Continuous PRN Gottfried, Rhoda J, MD       magnesium  hydroxide (MILK OF MAGNESIA) suspension 30 mL  30 mL Oral Daily PRN White, Patrice L, NP       magnesium  oxide (MAG-OX) tablet 200 mg  200 mg Oral BID Lawrnce, Rhoda J, MD   200 mg at 01/05/24 0947   melatonin tablet 3 mg  3 mg Oral QHS Gottfried, Rhoda J, MD   3 mg at 01/04/24 2132   multivitamin with minerals tablet 1 tablet  1 tablet Oral Daily White, Patrice L, NP   1 tablet at 01/05/24 0949   thiamine  (VITAMIN B1) tablet 100 mg  100 mg Oral Daily White, Patrice L, NP   100 mg at 01/05/24 9050   Current Outpatient Medications  Medication Sig Dispense Refill   mirtazapine (REMERON) 15 MG tablet Take  15 mg by mouth at bedtime. (Patient taking differently: Take 15 mg by mouth at bedtime as needed (Sleep).)     Multiple Vitamins-Minerals (MULTIVITAMIN ADULTS PO) Take 1 tablet by mouth daily.     thiamine  (VITAMIN B-1) 100 MG tablet Take 100 mg by mouth daily.     traZODone  (DESYREL ) 100 MG tablet Take 100 mg by mouth at bedtime as needed for sleep.      Labs  Lab Results:  Admission on 01/04/2024, Discharged on 01/04/2024  Component Date Value Ref Range Status   WBC 01/04/2024 8.5  4.0 - 10.5 K/uL Final   RBC 01/04/2024 4.91  4.22 - 5.81 MIL/uL Final   Hemoglobin 01/04/2024 15.1  13.0 - 17.0 g/dL Final   HCT 88/83/7974 43.4  39.0 - 52.0 % Final   MCV 01/04/2024 88.4  80.0 - 100.0 fL Final   MCH 01/04/2024 30.8  26.0 - 34.0 pg Final   MCHC 01/04/2024 34.8  30.0 - 36.0 g/dL Final   RDW 88/83/7974 16.0 (H)  11.5 - 15.5 % Final   Platelets 01/04/2024 235  150 - 400 K/uL Final   nRBC 01/04/2024 0.0  0.0 - 0.2 % Final   Neutrophils Relative %  01/04/2024 76  % Final   Neutro Abs 01/04/2024 6.6  1.7 - 7.7 K/uL Final   Lymphocytes Relative 01/04/2024 17  % Final   Lymphs Abs 01/04/2024 1.4  0.7 - 4.0 K/uL Final   Monocytes Relative 01/04/2024 6  % Final   Monocytes Absolute 01/04/2024 0.5  0.1 - 1.0 K/uL Final   Eosinophils Relative 01/04/2024 0  % Final   Eosinophils Absolute 01/04/2024 0.0  0.0 - 0.5 K/uL Final   Basophils Relative 01/04/2024 1  % Final   Basophils Absolute 01/04/2024 0.0  0.0 - 0.1 K/uL Final   Immature Granulocytes 01/04/2024 0  % Final   Abs Immature Granulocytes 01/04/2024 0.02  0.00 - 0.07 K/uL Final   Performed at [facility], [address], [place], [place] [address]   Sodium 01/04/2024 138  135 - 145 mmol/L Final   Potassium 01/04/2024 4.4  3.5 - 5.1 mmol/L Final   Chloride 01/04/2024 99  98 - 111 mmol/L Final   CO2 01/04/2024 27  22 - 32 mmol/L Final   Glucose, Bld 01/04/2024 88  70 - 99 mg/dL Final   Glucose reference range applies only to samples taken after fasting for at least 8 hours.   BUN 01/04/2024 10  6 - 20 mg/dL Final   Creatinine, Ser 01/04/2024 1.17  0.61 - 1.24 mg/dL Final   Calcium 88/83/7974 9.3  8.9 - 10.3 mg/dL Final   Total Protein 88/83/7974 7.5  6.5 - 8.1 g/dL Final   Albumin 88/83/7974 4.2  3.5 - 5.0 g/dL Final   AST 88/83/7974 60 (H)  15 - 41 U/L Final   ALT 01/04/2024 17  0 - 44 U/L Final   Alkaline Phosphatase 01/04/2024 56  38 - 126 U/L Final   Total Bilirubin 01/04/2024 1.2  0.0 - 1.2 mg/dL Final   GFR, Estimated 01/04/2024 >60  >60 mL/min Final   Comment: (NOTE) Calculated using the CKD-EPI Creatinine Equation (2021)    Anion gap 01/04/2024 12  5 - 15 Final   Performed at [facility], [address] [place], [place] [address]   Magnesium  01/04/2024 1.9  1.7 - 2.4 mg/dL Final   Performed at [facility], [address], [place], [place] [address]  Alcohol, Ethyl (B) 01/04/2024 <15  <15 mg/dL Final   Comment: (NOTE) For medical purposes  only. Performed at [facility], [address], [place], [place] [address]   Admission on 01/04/2024, Discharged on 01/04/2024  Component Date Value Ref Range Status   WBC 01/04/2024 8.8  4.0 - 10.5 K/uL Final   RBC 01/04/2024 4.91  4.22 - 5.81 MIL/uL Final   Hemoglobin 01/04/2024 15.1  13.0 - 17.0 g/dL Final   HCT 88/83/7974 43.7  39.0 - 52.0 % Final   MCV 01/04/2024 89.0  80.0 - 100.0 fL Final   MCH 01/04/2024 30.8  26.0 - 34.0 pg Final   MCHC 01/04/2024 34.6  30.0 - 36.0 g/dL Final   RDW 88/83/7974 16.4 (H)  11.5 - 15.5 % Final   Platelets 01/04/2024 235  150 - 400 K/uL Final   nRBC 01/04/2024 0.0  0.0 - 0.2 % Final   Neutrophils Relative % 01/04/2024 81  % Final   Neutro Abs 01/04/2024 7.1  1.7 - 7.7 K/uL Final   Lymphocytes Relative 01/04/2024 12  % Final   Lymphs Abs 01/04/2024 1.1  0.7 - 4.0 K/uL Final   Monocytes Relative 01/04/2024 7  % Final   Monocytes Absolute 01/04/2024 0.6  0.1 - 1.0 K/uL Final   Eosinophils Relative 01/04/2024 0  % Final   Eosinophils Absolute 01/04/2024 0.0  0.0 - 0.5 K/uL Final   Basophils Relative 01/04/2024 0  % Final   Basophils Absolute 01/04/2024 0.0  0.0 - 0.1 K/uL Final   Immature Granulocytes 01/04/2024 0  % Final   Abs Immature Granulocytes 01/04/2024 0.02  0.00 - 0.07 K/uL Final   Performed at [facility], [address] [place], [place] [address]   Sodium 01/04/2024 140  135 - 145 mmol/L Final   Potassium 01/04/2024 4.3  3.5 - 5.1 mmol/L Final   Chloride 01/04/2024 101  98 - 111 mmol/L Final   CO2 01/04/2024 25  22 - 32 mmol/L Final   Glucose, Bld 01/04/2024 64 (L)  70 - 99 mg/dL Final   Glucose reference range applies only to samples taken after fasting for at least 8 hours.   BUN 01/04/2024 10  6 - 20 mg/dL Final   Creatinine, Ser 01/04/2024 0.98  0.61 - 1.24 mg/dL Final   Calcium 88/83/7974 9.0  8.9 - 10.3 mg/dL Final   Total Protein 88/83/7974 7.4  6.5 - 8.1 g/dL Final   Albumin 88/83/7974 4.3  3.5 - 5.0 g/dL Final    AST 88/83/7974 60 (H)  15 - 41 U/L Final   ALT 01/04/2024 17  0 - 44 U/L Final   Alkaline Phosphatase 01/04/2024 56  38 - 126 U/L Final   Total Bilirubin 01/04/2024 1.4 (H)  0.0 - 1.2 mg/dL Final   GFR, Estimated 01/04/2024 >60  >60 mL/min Final   Comment: (NOTE) Calculated using the CKD-EPI Creatinine Equation (2021)    Anion gap 01/04/2024 14  5 - 15 Final   Performed at [facility], [address] [place], [place] [address]   Hgb A1c MFr Bld 01/04/2024 5.5  4.8 - 5.6 % Final   Comment: (NOTE) Diagnosis of Diabetes The following HbA1c ranges recommended by the American Diabetes Association (ADA) may be used as an aid in the diagnosis of diabetes mellitus.  Hemoglobin             Suggested A1C NGSP%  Diagnosis  <5.7                   Non Diabetic  5.7-6.4                Pre-Diabetic  >6.4                   Diabetic  <7.0                   Glycemic control for                       adults with diabetes.     Mean Plasma Glucose 01/04/2024 111.15  mg/dL Final   Performed at [facility], [address], [place], [place] [address]   Alcohol, Ethyl (B) 01/04/2024 55 (H)  <15 mg/dL Final   Comment: (NOTE) For medical purposes only. Performed at [facility], [address], [place], [place] [address]    Cholesterol 01/04/2024 223 (H)  0 - 200 mg/dL Final   Triglycerides 88/83/7974 38  <150 mg/dL Final   HDL 88/83/7974 124  >40 mg/dL Final   Total CHOL/HDL Ratio 01/04/2024 1.8  RATIO Final   VLDL 01/04/2024 8  0 - 40 mg/dL Final   LDL Cholesterol 01/04/2024 91  0 - 99 mg/dL Final   Comment:        Total Cholesterol/HDL:CHD Risk Coronary Heart Disease Risk Table                     Men   Women  1/2 Average Risk   3.4   3.3  Average Risk       5.0   4.4  2 X Average Risk   9.6   7.1  3 X Average Risk  23.4   11.0        Use the calculated Patient Ratio above and the CHD Risk Table to determine the patient's CHD Risk.        ATP III  CLASSIFICATION (LDL):  <100     mg/dL   Optimal  899-870  mg/dL   Near or Above                    Optimal  130-159  mg/dL   Borderline  839-810  mg/dL   High  >809     mg/dL   Very High Performed at [facility], [address], [place], [place] [address]    TSH 01/04/2024 0.392  0.350 - 4.500 uIU/mL Final   Comment: Performed by a 3rd Generation assay with a functional sensitivity of <=0.01 uIU/mL. Performed at [facility], [address] [place], [place] [address]    POC Amphetamine UR 01/04/2024 None Detected  NONE DETECTED (Cut Off Level 1000 ng/mL) Final   POC Secobarbital (BAR) 01/04/2024 None Detected  NONE DETECTED (Cut Off Level 300 ng/mL) Final   POC Buprenorphine (BUP) 01/04/2024 None Detected  NONE DETECTED (Cut Off Level 10 ng/mL) Final   POC Oxazepam (BZO) 01/04/2024 None Detected  NONE DETECTED (Cut Off Level 300 ng/mL) Final   POC Cocaine UR 01/04/2024 None Detected  NONE DETECTED (Cut Off Level 300 ng/mL) Final   POC Methamphetamine UR 01/04/2024 None Detected  NONE DETECTED (Cut Off Level 1000 ng/mL) Final   POC Morphine 01/04/2024 None Detected  NONE DETECTED (Cut Off Level 300 ng/mL) Final   POC Methadone UR 01/04/2024 None  Detected  NONE DETECTED (Cut Off Level 300 ng/mL) Final   POC Oxycodone  UR 01/04/2024 None Detected  NONE DETECTED (Cut Off Level 100 ng/mL) Final   POC Marijuana UR 01/04/2024 None Detected  NONE DETECTED (Cut Off Level 50 ng/mL) Final  Admission on 09/06/2023, Discharged on 09/06/2023  Component Date Value Ref Range Status   WBC 09/06/2023 7.6  4.0 - 10.5 K/uL Final   RBC 09/06/2023 4.46  4.22 - 5.81 MIL/uL Final   Hemoglobin 09/06/2023 13.9  13.0 - 17.0 g/dL Final   HCT 92/80/7974 39.7  39.0 - 52.0 % Final   MCV 09/06/2023 89.0  80.0 - 100.0 fL Final   MCH 09/06/2023 31.2  26.0 - 34.0 pg Final   MCHC 09/06/2023 35.0  30.0 - 36.0 g/dL Final   RDW 92/80/7974 14.2  11.5 - 15.5 % Final   Platelets 09/06/2023 198  150 - 400 K/uL  Final   nRBC 09/06/2023 0.0  0.0 - 0.2 % Final   Neutrophils Relative % 09/06/2023 60  % Final   Neutro Abs 09/06/2023 4.6  1.7 - 7.7 K/uL Final   Lymphocytes Relative 09/06/2023 34  % Final   Lymphs Abs 09/06/2023 2.6  0.7 - 4.0 K/uL Final   Monocytes Relative 09/06/2023 5  % Final   Monocytes Absolute 09/06/2023 0.4  0.1 - 1.0 K/uL Final   Eosinophils Relative 09/06/2023 0  % Final   Eosinophils Absolute 09/06/2023 0.0  0.0 - 0.5 K/uL Final   Basophils Relative 09/06/2023 1  % Final   Basophils Absolute 09/06/2023 0.1  0.0 - 0.1 K/uL Final   Immature Granulocytes 09/06/2023 0  % Final   Abs Immature Granulocytes 09/06/2023 0.01  0.00 - 0.07 K/uL Final   Performed at [facility], [address], [place], [place] [address]   Sodium 09/06/2023 142  135 - 145 mmol/L Final   Potassium 09/06/2023 3.7  3.5 - 5.1 mmol/L Final   Chloride 09/06/2023 105  98 - 111 mmol/L Final   CO2 09/06/2023 22  22 - 32 mmol/L Final   Glucose, Bld 09/06/2023 93  70 - 99 mg/dL Final   Glucose reference range applies only to samples taken after fasting for at least 8 hours.   BUN 09/06/2023 8  6 - 20 mg/dL Final   Creatinine, Ser 09/06/2023 1.03  0.61 - 1.24 mg/dL Final   Calcium 92/80/7974 9.1  8.9 - 10.3 mg/dL Final   Total Protein 92/80/7974 7.3  6.5 - 8.1 g/dL Final   Albumin 92/80/7974 4.4  3.5 - 5.0 g/dL Final   AST 92/80/7974 76 (H)  15 - 41 U/L Final   ALT 09/06/2023 37  0 - 44 U/L Final   Alkaline Phosphatase 09/06/2023 49  38 - 126 U/L Final   Total Bilirubin 09/06/2023 0.9  0.0 - 1.2 mg/dL Final   GFR, Estimated 09/06/2023 >60  >60 mL/min Final   Comment: (NOTE) Calculated using the CKD-EPI Creatinine Equation (2021)    Anion gap 09/06/2023 15  5 - 15 Final   Performed at [facility], [address] [place], [place] [address]   Alcohol, Ethyl (B) 09/06/2023 198 (H)  <15 mg/dL Final   Comment: (NOTE) For medical purposes only. Performed at [facility], [address], [place], [place] [address]    POC Amphetamine UR 09/06/2023 None Detected  NONE DETECTED (Cut Off Level 1000 ng/mL) Final   POC Secobarbital (BAR) 09/06/2023 None Detected  NONE DETECTED (  Cut Off Level 300 ng/mL) Final   POC Buprenorphine (BUP) 09/06/2023 None Detected  NONE DETECTED (Cut Off Level 10 ng/mL) Final   POC Oxazepam (BZO) 09/06/2023 None Detected  NONE DETECTED (Cut Off Level 300 ng/mL) Final   POC Cocaine UR 09/06/2023 None Detected  NONE DETECTED (Cut Off Level 300 ng/mL) Final   POC Methamphetamine UR 09/06/2023 None Detected  NONE DETECTED (Cut Off Level 1000 ng/mL) Final   POC Morphine 09/06/2023 None Detected  NONE DETECTED (Cut Off Level 300 ng/mL) Final   POC Methadone UR 09/06/2023 None Detected  NONE DETECTED (Cut Off Level 300 ng/mL) Final   POC Oxycodone  UR 09/06/2023 None Detected  NONE DETECTED (Cut Off Level 100 ng/mL) Final   POC Marijuana UR 09/06/2023 None Detected  NONE DETECTED (Cut Off Level 50 ng/mL) Final   Magnesium  09/06/2023 1.9  1.7 - 2.4 mg/dL Final   Performed at [facility], [address], [place], [place] [address]    Blood Alcohol level:  Lab Results  Component Value Date   ETH <15 01/04/2024   ETH 55 (H) 01/04/2024    Metabolic Disorder Labs: Lab Results  Component Value Date   HGBA1C 5.5 01/04/2024   MPG 111.15 01/04/2024   No results found for: PROLACTIN Lab Results  Component Value Date   CHOL 223 (H) 01/04/2024   TRIG 38 01/04/2024   HDL 124 01/04/2024   CHOLHDL 1.8 01/04/2024   VLDL 8 01/04/2024   LDLCALC 91 01/04/2024    Therapeutic Lab Levels: No results found for: LITHIUM No results found for: VALPROATE No results found for: CBMZ  Physical Findings   PHQ2-9    Flowsheet Row ED from 09/06/2023 in [facility] ED from 11/15/2022 in [facility]  PHQ-2 Total Score 0 4  PHQ-9 Total Score 9 10   Flowsheet Row ED from 01/04/2024 in [facility] Most recent reading at 01/04/2024  8:27 PM ED from 01/04/2024 in [facility] at [facility] Most recent reading at 01/04/2024  1:23 PM ED from 01/04/2024 in [facility] Most recent reading at 01/04/2024 11:24 AM  C-SSRS RISK CATEGORY No Risk No Risk No Risk     Musculoskeletal  Strength & Muscle Tone: within normal limits Gait & Station: normal Patient leans: N/A  Psychiatric Specialty Exam  Presentation  General Appearance:  Appropriate for Environment  Eye Contact: Fair  Speech: Clear and Coherent  Speech Volume: Normal  Handedness: Right   Mood and Affect  Mood: Depressed  Affect: Congruent   Thought Process  Thought Processes: Coherent; Goal Directed  Descriptions of Associations:Intact  Orientation:Full (Time, Place and Person)  Thought Content:Logical  Diagnosis of Schizophrenia or Schizoaffective disorder in past: No    Hallucinations:Hallucinations: None  Ideas of Reference:None  Suicidal Thoughts:Suicidal Thoughts: No  Homicidal Thoughts:Homicidal Thoughts: No   Sensorium  Memory: Immediate Fair; Recent Fair  Judgment: Fair  Insight: Fair   Chartered Certified Accountant: Fair  Attention Span: Fair  Recall: [employer] of Knowledge: Fair  Language: Fair   Psychomotor Activity  Psychomotor Activity: Psychomotor Activity: Normal   Assets  Assets: Communication Skills; Desire for Improvement; Leisure Time   Sleep  Sleep: Sleep: Poor (nonrestorative, middle/terminal insomnia)  Estimated Sleeping Duration (Last 24 Hours): 10.50-12.25 hours (Due to Daylight Saving Time, the durations displayed may not accurately represent documentation during the time change interval)  Nutritional Assessment (For OBS  and FBC admissions only) Has the patient had a weight loss or gain of 10 pounds or more in the last 3 months?: No Has the  patient had a decrease in food intake/or appetite?: Yes Does the patient have dental problems?: No Does the patient have eating habits or behaviors that may be indicators of an eating disorder including binging or inducing vomiting?: No Has the patient recently lost weight without trying?: 1 Has the patient been eating poorly because of a decreased appetite?: 1 Malnutrition Screening Tool Score: 2    Physical Exam  Physical Exam ROS Blood pressure (!) 170/108, pulse 77, temperature 98.2 F (36.8 C), temperature source Oral, resp. rate 17, SpO2 98%. There is no height or weight on file to calculate BMI.  Treatment Plan Summary: 33 year old male with a family history of anxiety and 12 year history of heavy alcohol consumption consistent with alcohol use disorder (heavy/binge) with mood/anxiety/insomnia components. He successfully completed rehabilitation program but relapsed in the context of leaving treatment. He may have underlying mood/anxiety disorder driving alcohol use disorder. Alcohol is interfering with his ability to work. He has an abnormal EKG (Q Tc) prolongation and needs cardiology followup in addition to deliberate selection of pharmaceutical interventions. and was recommended to closely follow with cardiology limiting his mediation options. Q Tc concerns likely multifactorial including electrolyte and non-electrolyte physiology. Order surveillance labs including Mg, Zn, K, Ca. Consider supplementation with optimal Mg agents such as glycinate/threonate which may provider anxiety/sleep/mood benefits in addition to cardiac. Increase melatonin to 6mg  at bedtime, resume mirtazapine 15mg  at bedtime, optimize nicotine  replacement, and resume naltrexone  50mg  in the morning. Anticipate transition to [facility] [facility] [facility] (insurance permitting) vs [facility] for treatment.    KANDI JAYSON HAHN, MD 01/05/2024 3:52 PM

## 2024-01-05 NOTE — Group Note (Signed)
 Group Topic: Social Support  Group Date: 01/05/2024 Start Time: 2000 End Time: 2030 Facilitators: Joan Plowman B  Department: [facility]  Number of Participants: 7  Group Focus: abuse issues and check in Treatment Modality:  Individual Therapy and Spiritual Interventions utilized were patient education and support Purpose: express feelings, increase insight, and relapse prevention strategies  Name: Chad Tanner Date of Birth: 10-Oct-1990  MR: [id number]    Level of Participation: active Quality of Participation: attentive Interactions with others: gave feedback Mood/Affect: appropriate Triggers (if applicable): NA Cognition: coherent/clear Progress: Gaining insight Response: NA Plan: patient will be encouraged to keep going to groups  Patients Problems:  Patient Active Problem List   Diagnosis Date Noted   Abnormal EKG 01/05/2024   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

## 2024-01-05 NOTE — Group Note (Signed)
 Group Topic: Recovery Basics  Group Date: 01/05/2024 Start Time: 1000 End Time: 1100 Facilitators: Elnor Keven SAILOR  Department: [facility]  Number of Participants: 9  Group Focus: abuse issues Treatment Modality:  Psychoeducation Interventions utilized were support Purpose: express feelings  Name: Chad Tanner Date of Birth: January 27, 1991  MR: [id number]    Level of Participation: Pt did not attend group Quality of Participation: NA Interactions with others: NA Mood/Affect: appropriate Triggers (if applicable): NA Cognition: NA Progress: None Response: Pt did not attend group Plan: follow-up needed  Patients Problems:  Patient Active Problem List   Diagnosis Date Noted   Alcohol use disorder 09/06/2023   Alcohol abuse 11/15/2022   Alcohol dependence (HCC) 05/01/2013   Depression 05/01/2013

## 2024-01-05 NOTE — Group Note (Signed)
 Group Topic: Wellness  Group Date: 01/05/2024 Start Time: 1200 End Time: 1230 Facilitators: Daved Tinnie HERO, RN; Wilfred Pinch, RN  Department: [facility]  Number of Participants: 9  Group Focus: communication Treatment Modality:  Psychoeducation Interventions utilized were patient education Purpose: increase insight  Name: SAMAJ WESSELLS Date of Birth: 04/28/1990  MR: [id number]    Level of Participation: active Quality of Participation: attentive and cooperative Interactions with others: gave feedback Mood/Affect: appropriate Triggers (if applicable): n/a Cognition: coherent/clear Progress: Gaining insight Response: pt says they will go to classes to assist with managing emotions. Pt says he feels determined.  Plan: patient will be encouraged to attend future RN education groups  Patients Problems:  Patient Active Problem List   Diagnosis Date Noted   Alcohol use disorder 09/06/2023   Alcohol abuse 11/15/2022   Alcohol dependence (HCC) 05/01/2013   Depression 05/01/2013

## 2024-01-05 NOTE — ED Notes (Signed)
 RN spoke with patient A&Ox4. Denies intent to harm self/others when asked. Denies A/VH or any physical complaints when asked. No acute distress noted. Active listening, support and encouragement provided. Routine safety checks conducted according to facility protocol. Encouraged patient to notify staff if thoughts of harm toward self or others arise. Patient verbalize understanding and agreement.

## 2024-01-06 DIAGNOSIS — F10239 Alcohol dependence with withdrawal, unspecified: Secondary | ICD-10-CM | POA: Diagnosis not present

## 2024-01-06 DIAGNOSIS — R9431 Abnormal electrocardiogram [ECG] [EKG]: Secondary | ICD-10-CM | POA: Diagnosis not present

## 2024-01-06 DIAGNOSIS — F1024 Alcohol dependence with alcohol-induced mood disorder: Secondary | ICD-10-CM | POA: Diagnosis not present

## 2024-01-06 DIAGNOSIS — F10282 Alcohol dependence with alcohol-induced sleep disorder: Secondary | ICD-10-CM | POA: Diagnosis not present

## 2024-01-06 NOTE — Group Note (Signed)
 Group Topic: Change and Accountability  Group Date: 01/06/2024 Start Time: 1315 End Time: 1400 Facilitators: Alyse Leilani LABOR, NT  Department: [facility]  Number of Participants: 9  Group Focus: acceptance, forgiveness, and relapse prevention Treatment Modality:  Psychodynamic Psychotherapy Interventions utilized were group exercise and patient education Purpose: enhance coping skills, express feelings, regain self-worth, and reinforce self-care  Name: Chad Tanner Date of Birth: Apr 29, 1990  MR: [id number]    Level of Participation: minimal Quality of Participation: cooperative Interactions with others: quite Mood/Affect: positive Triggers (if applicable): none Cognition: no insight Progress: None Response: none Plan: patient will be encouraged to keep coming to group  Patients Problems:  Patient Active Problem List   Diagnosis Date Noted   Abnormal EKG 01/05/2024   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

## 2024-01-06 NOTE — ED Notes (Signed)
 Pt is sleeping, no acute distress noted. Q15 safety checks in place.

## 2024-01-06 NOTE — Care Management (Addendum)
 [facility] Care Management...  Addendum 2:50 pm...  Writer met with patient to discuss discharge planning.SABRA  Writer advised patient of acceptance to [facility] to complete two part intake process on December 4th @ 9:00 am  Writer informed patient of possible placement at [facility]) until his appointment.   Writer will connect patient with [facility])    Addendum 10:17 am Per Jordan @ [facility], patient has scheduled to come in on December 4th @ 9:00 am to complete two part intake process  Writer followed up with Debbie at [facility]..  Per Marval they were unable to identify patients insurance/  Writer will research patients insurance

## 2024-01-06 NOTE — Group Note (Signed)
 Group Topic: Social Support  Group Date: 01/06/2024 Start Time: 1945 End Time: 2015 Facilitators: Joan Plowman B  Department: [facility]  Number of Participants: 4  Group Focus: clarity of thought and daily focus Treatment Modality:  Individual Therapy Interventions utilized were support Purpose: express feelings, increase insight, reinforce self-care, and relapse prevention strategies  Name: HOYTE ZIEBELL Date of Birth: May 03, 1990  MR: [id number]    Level of Participation: PT did not attend group   Patients Problems:  Patient Active Problem List   Diagnosis Date Noted   Abnormal EKG 01/05/2024   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

## 2024-01-06 NOTE — ED Notes (Signed)
Patient sleeping with no s/s of distress.

## 2024-01-06 NOTE — Group Note (Signed)
 Group Topic: Social Support  Group Date: 01/06/2024 Start Time: 1200 End Time: 1230 Facilitators: Armandina Iman, Zane HERO, RN  Department: [facility]  Number of Participants: 1  Group Focus: check in Treatment Modality:  Individual Therapy Interventions utilized were support Purpose: express feelings  Name: Chad Tanner Date of Birth: 11-Nov-1990  MR: [id number]    Level of Participation: minimal Quality of Participation: cooperative and isolative Interactions with others: gave feedback Mood/Affect: blunted Triggers (if applicable): None identified at this time Cognition: coherent/clear Progress: Gaining insight Response: Patient responds minimally to writer, primarily isolates to room. No concerns voiced on the unit regarding stay. Voices understanding of medications and treatment plan at this time. 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

## 2024-01-06 NOTE — ED Notes (Signed)

## 2024-01-06 NOTE — ED Notes (Signed)
 Patient was upset over not having white sugar, it was explained to the patients that Dr. Cole did not want the patient's eating white sugar, Patient was aware of this and did not complain before, but today he was yelling and screaming using profanity, security was called and he calmed down.

## 2024-01-06 NOTE — ED Provider Notes (Signed)
 Behavioral Health Progress Note  Date and Time: 01/06/2024 4:48 PM Name: Chad Tanner MRN:  [id number]  Admission HPI: Pt is reporting that he relapsed on alcohol after doing [facility] for 30 days. He has a history of tachycardia when he stops drinking. He hallucinates sometimes when he is drinking but not in withdrawal and never otherwise. He reports that he has mild tremors from withdrawal currently and no other symptoms.   24 hr chart review: Sleep Hours last night: good per patient  Nursing Concerns: some irritability reported by nursing. Behavioral episodes in the past 24 hrs: as above Medication Compliance: compliant  Vital Signs in the past 24 hrs: SBP in the 140s PRN Medications in the past 24 hrs: none per review of MAR  Patient assessment: On assessment today, the pt reports that their mood is still depressed, but improving. Rates depression today as 5-6, with 10 being the worst. Reports that anxiety is resolving. Sleep is improved on current medications (Remeron and melatonin).  Appetite is fair.  Concentration is fair   Energy level is still low as per patient.  Denies suicidal thoughts. Denies suicidal intent and plan.  Denies having any HI.  Denies having psychotic symptoms. Denies AVH, denies paranoia, denies delusional thinking and denies first ranks.   Denies having side effects to current psychiatric medications.  We discussed continuing current medication regimen with no changes at this time. Discussed the following psychosocial stressors: Homelessness and recurrent substance use.  Plan is for patient to go to [facility] transitional program, with plan in place to discharge him on 11/20.  EKG from 11/17 with prolonged QTc of 509, will need repeat on 11/19.  Currently ordered.  Patient is asking for nicotine  patch, will have to wait for this to be completed prior to patch being reordered. Labs reviewed: No new orders placed, magnesium  level is within normal  limits.  Diagnosis:  Final diagnoses:  Alcohol dependence with withdrawal with complication (HCC)  Abnormal EKG  Alcohol-induced insomnia (HCC)  Alcohol-induced mood disorder (HCC)    Total Time spent with patient: 45 minutes  Sleep: Good  Appetite:  Good  Current Medications:  Current Facility-Administered Medications  Medication Dose Route Frequency Provider Last Rate Last Admin   acetaminophen  (TYLENOL ) tablet 650 mg  650 mg Oral Q6H PRN White, Patrice L, NP       alum & mag hydroxide-simeth (MAALOX/MYLANTA) 200-200-20 MG/5ML suspension 30 mL  30 mL Oral Q4H PRN White, Patrice L, NP       loperamide  (IMODIUM ) capsule 2-4 mg  2-4 mg Oral PRN White, Patrice L, NP       LORazepam  (ATIVAN ) injection 1 mg  1 mg Intramuscular Continuous PRN Gottfried, Rhoda J, MD       LORazepam  (ATIVAN ) tablet 1 mg  1 mg Oral Q6H PRN White, Patrice L, NP   1 mg at 01/04/24 2132   LORazepam  (ATIVAN ) tablet 1 mg  1 mg Oral TID Gottfried, Rhoda J, MD   1 mg at 01/06/24 1522   Followed by   NOREEN ON 01/07/2024] LORazepam  (ATIVAN ) tablet 1 mg  1 mg Oral BID Gottfried, Rhoda J, MD       Followed by   NOREEN ON 01/09/2024] LORazepam  (ATIVAN ) tablet 1 mg  1 mg Oral Daily Gottfried, Rhoda J, MD       LORazepam  (ATIVAN ) tablet 1 mg  1 mg Oral Continuous PRN Gottfried, Rhoda J, MD       magnesium  hydroxide (MILK OF MAGNESIA) suspension  30 mL  30 mL Oral Daily PRN White, Patrice L, NP       magnesium  oxide (MAG-OX) tablet 200 mg  200 mg Oral BID Lawrnce, Rhoda J, MD   200 mg at 01/06/24 9081   melatonin tablet 5 mg  5 mg Oral QHS Bethea, Terrence C, MD   5 mg at 01/05/24 2141   mirtazapine (REMERON) tablet 15 mg  15 mg Oral QHS Bethea, Terrence C, MD   15 mg at 01/05/24 2148   multivitamin with minerals tablet 1 tablet  1 tablet Oral Daily White, Patrice L, NP   1 tablet at 01/06/24 0918   naltrexone  (DEPADE) tablet 50 mg  50 mg Oral Landrum Cole Kandi JAYSON, MD   50 mg at 01/06/24 9175   thiamine  (VITAMIN  B1) tablet 100 mg  100 mg Oral Daily White, Patrice L, NP   100 mg at 01/06/24 9081   Current Outpatient Medications  Medication Sig Dispense Refill   mirtazapine (REMERON) 15 MG tablet Take 15 mg by mouth at bedtime. (Patient taking differently: Take 15 mg by mouth at bedtime as needed (Sleep).)     Multiple Vitamins-Minerals (MULTIVITAMIN ADULTS PO) Take 1 tablet by mouth daily.     thiamine  (VITAMIN B-1) 100 MG tablet Take 100 mg by mouth daily.     traZODone  (DESYREL ) 100 MG tablet Take 100 mg by mouth at bedtime as needed for sleep.      Labs  Lab Results:  Admission on 01/04/2024, Discharged on 01/04/2024  Component Date Value Ref Range Status   WBC 01/04/2024 8.5  4.0 - 10.5 K/uL Final   RBC 01/04/2024 4.91  4.22 - 5.81 MIL/uL Final   Hemoglobin 01/04/2024 15.1  13.0 - 17.0 g/dL Final   HCT 88/83/7974 43.4  39.0 - 52.0 % Final   MCV 01/04/2024 88.4  80.0 - 100.0 fL Final   MCH 01/04/2024 30.8  26.0 - 34.0 pg Final   MCHC 01/04/2024 34.8  30.0 - 36.0 g/dL Final   RDW 88/83/7974 16.0 (H)  11.5 - 15.5 % Final   Platelets 01/04/2024 235  150 - 400 K/uL Final   nRBC 01/04/2024 0.0  0.0 - 0.2 % Final   Neutrophils Relative % 01/04/2024 76  % Final   Neutro Abs 01/04/2024 6.6  1.7 - 7.7 K/uL Final   Lymphocytes Relative 01/04/2024 17  % Final   Lymphs Abs 01/04/2024 1.4  0.7 - 4.0 K/uL Final   Monocytes Relative 01/04/2024 6  % Final   Monocytes Absolute 01/04/2024 0.5  0.1 - 1.0 K/uL Final   Eosinophils Relative 01/04/2024 0  % Final   Eosinophils Absolute 01/04/2024 0.0  0.0 - 0.5 K/uL Final   Basophils Relative 01/04/2024 1  % Final   Basophils Absolute 01/04/2024 0.0  0.0 - 0.1 K/uL Final   Immature Granulocytes 01/04/2024 0  % Final   Abs Immature Granulocytes 01/04/2024 0.02  0.00 - 0.07 K/uL Final   Performed at [facility], [address] [place], [place] [address]   Sodium 01/04/2024 138  135 - 145 mmol/L Final   Potassium 01/04/2024 4.4  3.5 - 5.1 mmol/L Final    Chloride 01/04/2024 99  98 - 111 mmol/L Final   CO2 01/04/2024 27  22 - 32 mmol/L Final   Glucose, Bld 01/04/2024 88  70 - 99 mg/dL Final   Glucose reference range applies only to samples taken after fasting for at least 8 hours.   BUN 01/04/2024  10  6 - 20 mg/dL Final   Creatinine, Ser 01/04/2024 1.17  0.61 - 1.24 mg/dL Final   Calcium 88/83/7974 9.3  8.9 - 10.3 mg/dL Final   Total Protein 88/83/7974 7.5  6.5 - 8.1 g/dL Final   Albumin 88/83/7974 4.2  3.5 - 5.0 g/dL Final   AST 88/83/7974 60 (H)  15 - 41 U/L Final   ALT 01/04/2024 17  0 - 44 U/L Final   Alkaline Phosphatase 01/04/2024 56  38 - 126 U/L Final   Total Bilirubin 01/04/2024 1.2  0.0 - 1.2 mg/dL Final   GFR, Estimated 01/04/2024 >60  >60 mL/min Final   Comment: (NOTE) Calculated using the CKD-EPI Creatinine Equation (2021)    Anion gap 01/04/2024 12  5 - 15 Final   Performed at [facility], [address] [place], [place] [address]   Magnesium  01/04/2024 1.9  1.7 - 2.4 mg/dL Final   Performed at [facility], [address] [place], [place] [address]   Alcohol, Ethyl (B) 01/04/2024 <15  <15 mg/dL Final   Comment: (NOTE) For medical purposes only. Performed at [facility], [address], [place], [place] [address]   Admission on 01/04/2024, Discharged on 01/04/2024  Component Date Value Ref Range Status   WBC 01/04/2024 8.8  4.0 - 10.5 K/uL Final   RBC 01/04/2024 4.91  4.22 - 5.81 MIL/uL Final   Hemoglobin 01/04/2024 15.1  13.0 - 17.0 g/dL Final   HCT 88/83/7974 43.7  39.0 - 52.0 % Final   MCV 01/04/2024 89.0  80.0 - 100.0 fL Final   MCH 01/04/2024 30.8  26.0 - 34.0 pg Final   MCHC 01/04/2024 34.6  30.0 - 36.0 g/dL Final   RDW 88/83/7974 16.4 (H)  11.5 - 15.5 % Final   Platelets 01/04/2024 235  150 - 400 K/uL Final   nRBC 01/04/2024 0.0  0.0 - 0.2 % Final   Neutrophils Relative % 01/04/2024 81  % Final   Neutro Abs 01/04/2024 7.1  1.7 - 7.7 K/uL Final   Lymphocytes Relative 01/04/2024 12   % Final   Lymphs Abs 01/04/2024 1.1  0.7 - 4.0 K/uL Final   Monocytes Relative 01/04/2024 7  % Final   Monocytes Absolute 01/04/2024 0.6  0.1 - 1.0 K/uL Final   Eosinophils Relative 01/04/2024 0  % Final   Eosinophils Absolute 01/04/2024 0.0  0.0 - 0.5 K/uL Final   Basophils Relative 01/04/2024 0  % Final   Basophils Absolute 01/04/2024 0.0  0.0 - 0.1 K/uL Final   Immature Granulocytes 01/04/2024 0  % Final   Abs Immature Granulocytes 01/04/2024 0.02  0.00 - 0.07 K/uL Final   Performed at [facility], [address] [place], [place] [address]   Sodium 01/04/2024 140  135 - 145 mmol/L Final   Potassium 01/04/2024 4.3  3.5 - 5.1 mmol/L Final   Chloride 01/04/2024 101  98 - 111 mmol/L Final   CO2 01/04/2024 25  22 - 32 mmol/L Final   Glucose, Bld 01/04/2024 64 (L)  70 - 99 mg/dL Final   Glucose reference range applies only to samples taken after fasting for at least 8 hours.   BUN 01/04/2024 10  6 - 20 mg/dL Final   Creatinine, Ser 01/04/2024 0.98  0.61 - 1.24 mg/dL Final   Calcium 88/83/7974 9.0  8.9 - 10.3 mg/dL Final   Total Protein 88/83/7974 7.4  6.5 - 8.1 g/dL Final   Albumin 88/83/7974  4.3  3.5 - 5.0 g/dL Final   AST 88/83/7974 60 (H)  15 - 41 U/L Final   ALT 01/04/2024 17  0 - 44 U/L Final   Alkaline Phosphatase 01/04/2024 56  38 - 126 U/L Final   Total Bilirubin 01/04/2024 1.4 (H)  0.0 - 1.2 mg/dL Final   GFR, Estimated 01/04/2024 >60  >60 mL/min Final   Comment: (NOTE) Calculated using the CKD-EPI Creatinine Equation (2021)    Anion gap 01/04/2024 14  5 - 15 Final   Performed at [facility], [address], [place], [place] [address]   Hgb A1c MFr Bld 01/04/2024 5.5  4.8 - 5.6 % Final   Comment: (NOTE) Diagnosis of Diabetes The following HbA1c ranges recommended by the American Diabetes Association (ADA) may be used as an aid in the diagnosis of diabetes mellitus.  Hemoglobin             Suggested A1C NGSP%              Diagnosis  <5.7                    Non Diabetic  5.7-6.4                Pre-Diabetic  >6.4                   Diabetic  <7.0                   Glycemic control for                       adults with diabetes.     Mean Plasma Glucose 01/04/2024 111.15  mg/dL Final   Performed at [facility], [address] [place], [place] [address]   Alcohol, Ethyl (B) 01/04/2024 55 (H)  <15 mg/dL Final   Comment: (NOTE) For medical purposes only. Performed at [facility], [address], [place], [place] [address]    Cholesterol 01/04/2024 223 (H)  0 - 200 mg/dL Final   Triglycerides 88/83/7974 38  <150 mg/dL Final   HDL 88/83/7974 124  >40 mg/dL Final   Total CHOL/HDL Ratio 01/04/2024 1.8  RATIO Final   VLDL 01/04/2024 8  0 - 40 mg/dL Final   LDL Cholesterol 01/04/2024 91  0 - 99 mg/dL Final   Comment:        Total Cholesterol/HDL:CHD Risk Coronary Heart Disease Risk Table                     Men   Women  1/2 Average Risk   3.4   3.3  Average Risk       5.0   4.4  2 X Average Risk   9.6   7.1  3 X Average Risk  23.4   11.0        Use the calculated Patient Ratio above and the CHD Risk Table to determine the patient's CHD Risk.        ATP III CLASSIFICATION (LDL):  <100     mg/dL   Optimal  899-870  mg/dL   Near or Above                    Optimal  130-159  mg/dL   Borderline  839-810  mg/dL   High  >809     mg/dL   Very High Performed at [facility], [address] [place], [place] [address]    TSH 01/04/2024 0.392  0.350 - 4.500 uIU/mL Final   Comment: Performed by a 3rd Generation assay with a functional sensitivity of <=0.01 uIU/mL. Performed at [facility], [address], [place], [place] [address]    POC Amphetamine UR 01/04/2024 None Detected  NONE DETECTED (Cut Off Level 1000 ng/mL) Final   POC Secobarbital (BAR) 01/04/2024 None Detected  NONE DETECTED (Cut Off Level 300 ng/mL) Final   POC Buprenorphine (BUP) 01/04/2024 None Detected  NONE DETECTED (Cut Off Level 10 ng/mL) Final    POC Oxazepam (BZO) 01/04/2024 None Detected  NONE DETECTED (Cut Off Level 300 ng/mL) Final   POC Cocaine UR 01/04/2024 None Detected  NONE DETECTED (Cut Off Level 300 ng/mL) Final   POC Methamphetamine UR 01/04/2024 None Detected  NONE DETECTED (Cut Off Level 1000 ng/mL) Final   POC Morphine 01/04/2024 None Detected  NONE DETECTED (Cut Off Level 300 ng/mL) Final   POC Methadone UR 01/04/2024 None Detected  NONE DETECTED (Cut Off Level 300 ng/mL) Final   POC Oxycodone  UR 01/04/2024 None Detected  NONE DETECTED (Cut Off Level 100 ng/mL) Final   POC Marijuana UR 01/04/2024 None Detected  NONE DETECTED (Cut Off Level 50 ng/mL) Final  Admission on 09/06/2023, Discharged on 09/06/2023  Component Date Value Ref Range Status   WBC 09/06/2023 7.6  4.0 - 10.5 K/uL Final   RBC 09/06/2023 4.46  4.22 - 5.81 MIL/uL Final   Hemoglobin 09/06/2023 13.9  13.0 - 17.0 g/dL Final   HCT 92/80/7974 39.7  39.0 - 52.0 % Final   MCV 09/06/2023 89.0  80.0 - 100.0 fL Final   MCH 09/06/2023 31.2  26.0 - 34.0 pg Final   MCHC 09/06/2023 35.0  30.0 - 36.0 g/dL Final   RDW 92/80/7974 14.2  11.5 - 15.5 % Final   Platelets 09/06/2023 198  150 - 400 K/uL Final   nRBC 09/06/2023 0.0  0.0 - 0.2 % Final   Neutrophils Relative % 09/06/2023 60  % Final   Neutro Abs 09/06/2023 4.6  1.7 - 7.7 K/uL Final   Lymphocytes Relative 09/06/2023 34  % Final   Lymphs Abs 09/06/2023 2.6  0.7 - 4.0 K/uL Final   Monocytes Relative 09/06/2023 5  % Final   Monocytes Absolute 09/06/2023 0.4  0.1 - 1.0 K/uL Final   Eosinophils Relative 09/06/2023 0  % Final   Eosinophils Absolute 09/06/2023 0.0  0.0 - 0.5 K/uL Final   Basophils Relative 09/06/2023 1  % Final   Basophils Absolute 09/06/2023 0.1  0.0 - 0.1 K/uL Final   Immature Granulocytes 09/06/2023 0  % Final   Abs Immature Granulocytes 09/06/2023 0.01  0.00 - 0.07 K/uL Final   Performed at [facility], [address], [place], [place] [address]   Sodium 09/06/2023 142  135 - 145  mmol/L Final   Potassium 09/06/2023 3.7  3.5 - 5.1 mmol/L Final   Chloride 09/06/2023 105  98 - 111 mmol/L Final   CO2 09/06/2023 22  22 - 32 mmol/L Final   Glucose, Bld 09/06/2023 93  70 - 99 mg/dL Final   Glucose reference range applies only to samples taken after fasting for at least 8 hours.   BUN 09/06/2023 8  6 - 20 mg/dL Final   Creatinine, Ser 09/06/2023 1.03  0.61 - 1.24 mg/dL Final   Calcium 92/80/7974 9.1  8.9 - 10.3 mg/dL Final   Total  Protein 09/06/2023 7.3  6.5 - 8.1 g/dL Final   Albumin 92/80/7974 4.4  3.5 - 5.0 g/dL Final   AST 92/80/7974 76 (H)  15 - 41 U/L Final   ALT 09/06/2023 37  0 - 44 U/L Final   Alkaline Phosphatase 09/06/2023 49  38 - 126 U/L Final   Total Bilirubin 09/06/2023 0.9  0.0 - 1.2 mg/dL Final   GFR, Estimated 09/06/2023 >60  >60 mL/min Final   Comment: (NOTE) Calculated using the CKD-EPI Creatinine Equation (2021)    Anion gap 09/06/2023 15  5 - 15 Final   Performed at [facility], [address], [place], [place] [address]   Alcohol, Ethyl (B) 09/06/2023 198 (H)  <15 mg/dL Final   Comment: (NOTE) For medical purposes only. Performed at [facility], [address], [place], [place] [address]    POC Amphetamine UR 09/06/2023 None Detected  NONE DETECTED (Cut Off Level 1000 ng/mL) Final   POC Secobarbital (BAR) 09/06/2023 None Detected  NONE DETECTED (Cut Off Level 300 ng/mL) Final   POC Buprenorphine (BUP) 09/06/2023 None Detected  NONE DETECTED (Cut Off Level 10 ng/mL) Final   POC Oxazepam (BZO) 09/06/2023 None Detected  NONE DETECTED (Cut Off Level 300 ng/mL) Final   POC Cocaine UR 09/06/2023 None Detected  NONE DETECTED (Cut Off Level 300 ng/mL) Final   POC Methamphetamine UR 09/06/2023 None Detected  NONE DETECTED (Cut Off Level 1000 ng/mL) Final   POC Morphine 09/06/2023 None Detected  NONE DETECTED (Cut Off Level 300 ng/mL) Final   POC Methadone UR 09/06/2023 None Detected  NONE DETECTED (Cut Off Level 300 ng/mL) Final   POC  Oxycodone  UR 09/06/2023 None Detected  NONE DETECTED (Cut Off Level 100 ng/mL) Final   POC Marijuana UR 09/06/2023 None Detected  NONE DETECTED (Cut Off Level 50 ng/mL) Final   Magnesium  09/06/2023 1.9  1.7 - 2.4 mg/dL Final   Performed at [facility], [address] [place], [place] [address]    Blood Alcohol level:  Lab Results  Component Value Date   ETH <15 01/04/2024   ETH 55 (H) 01/04/2024    Metabolic Disorder Labs: Lab Results  Component Value Date   HGBA1C 5.5 01/04/2024   MPG 111.15 01/04/2024   No results found for: PROLACTIN Lab Results  Component Value Date   CHOL 223 (H) 01/04/2024   TRIG 38 01/04/2024   HDL 124 01/04/2024   CHOLHDL 1.8 01/04/2024   VLDL 8 01/04/2024   LDLCALC 91 01/04/2024    Therapeutic Lab Levels: No results found for: LITHIUM No results found for: VALPROATE No results found for: CBMZ  Physical Findings   PHQ2-9    Flowsheet Row ED from 01/04/2024 in [facility] ED from 09/06/2023 in [facility] ED from 11/15/2022 in [facility]  PHQ-2 Total Score 1 0 4  PHQ-9 Total Score -- 9 10   Flowsheet Row ED from 01/04/2024 in [facility] Most recent reading at 01/04/2024  8:27 PM ED from 01/04/2024 in [facility] at [facility] Most recent reading at 01/04/2024  1:23 PM ED from 01/04/2024 in [facility] Most recent reading at 01/04/2024 11:24 AM  C-SSRS RISK CATEGORY No Risk No Risk No Risk     Musculoskeletal  Strength & Muscle Tone: within normal limits Gait & Station: normal Patient leans: N/A  Psychiatric Specialty Exam  Presentation  General Appearance:  Fairly Groomed  Eye Contact: Fair  Speech: Clear and Coherent  Speech Volume: Normal  Handedness: Right   Mood and Affect  Mood: Anxious;  Depressed  Affect: Congruent   Thought Process  Thought Processes: Coherent  Descriptions of Associations:Intact  Orientation:Full (Time, Place and Person)  Thought Content:Logical  Diagnosis of Schizophrenia or Schizoaffective disorder in past: No    Hallucinations:Hallucinations: None  Ideas of Reference:None  Suicidal Thoughts:Suicidal Thoughts: No  Homicidal Thoughts:Homicidal Thoughts: No   Sensorium  Memory: Immediate Fair  Judgment: Fair  Insight: Fair   Art Therapist  Concentration: Fair  Attention Span: Fair  Recall: Fair  Fund of Knowledge: Fair  Language: Fair   Psychomotor Activity  Psychomotor Activity: Psychomotor Activity: Normal   Assets  Assets: Resilience   Sleep  Sleep: Sleep: Fair  Estimated Sleeping Duration (Last 24 Hours): 12.00-14.00 hours (Due to Daylight Saving Time, the durations displayed may not accurately represent documentation during the time change interval)  Nutritional Assessment (For OBS and FBC admissions only) Has the patient had a weight loss or gain of 10 pounds or more in the last 3 months?: No Has the patient had a decrease in food intake/or appetite?: No Does the patient have dental problems?: No Does the patient have eating habits or behaviors that may be indicators of an eating disorder including binging or inducing vomiting?: No Has the patient recently lost weight without trying?: 0 Has the patient been eating poorly because of a decreased appetite?: 0 Malnutrition Screening Tool Score: 0   Physical Exam  Physical Exam Constitutional:      Appearance: Normal appearance.  HENT:     Head: Normocephalic.  Musculoskeletal:     Cervical back: Normal range of motion.  Neurological:     General: No focal deficit present.     Mental Status: He is alert and oriented to person, place, and time.  Psychiatric:        Mood and Affect: Mood normal.        Behavior: Behavior normal.         Thought Content: Thought content normal.    Review of Systems  Psychiatric/Behavioral:  Positive for depression and substance abuse. Negative for hallucinations, memory loss and suicidal ideas. The patient is nervous/anxious. The patient does not have insomnia.    Blood pressure (!) 141/94, pulse 60, temperature 97.6 F (36.4 C), temperature source Oral, resp. rate 19, SpO2 98%. There is no height or weight on file to calculate BMI.  Treatment Plan Summary: Daily contact with patient to assess and evaluate symptoms and progress in treatment and Medication management  Safety and Monitoring: Voluntary admission to inpatient psychiatric unit for safety, stabilization and treatment Daily contact with patient to assess and evaluate symptoms and progress in treatment Patient's case to be discussed in multi-disciplinary team meeting Observation Level : q15 minute checks Vital signs: q12 hours Precautions: Safety  Long Term Goal(s): Improvement in symptoms so as ready for discharge  Short Term Goals: Compliance with prescribed medications will improve and Ability to identify triggers associated with substance abuse/mental health issues will improve  Diagnoses Principal Problem:   Alcohol use disorder Active Problems:   Abnormal EKG   Alcohol-induced insomnia (HCC)   Alcohol-induced mood disorder (HCC)  Medications:  LORazepam   1 mg Oral TID   Followed by   NOREEN ON 01/07/2024] LORazepam   1 mg Oral BID   Followed by   NOREEN ON 01/09/2024] LORazepam   1 mg Oral  Daily   magnesium  oxide  200 mg Oral BID   melatonin  5 mg Oral QHS   mirtazapine  15 mg Oral QHS   multivitamin with minerals  1 tablet Oral Daily   naltrexone   50 mg Oral [id number]   thiamine   100 mg Oral Daily      PRNS -Continue Tylenol  650 mg every 6 hours PRN for mild pain -Continue Maalox 30 mg every 4 hrs PRN for indigestion -Continue Milk of Magnesia as needed every 6 hrs for constipation  Discharge  Planning: Social work and case management to assist with discharge planning and identification of hospital follow-up needs prior to discharge Estimated LOS: 5-7 days Discharge Concerns: Need to establish a safety plan; Medication compliance and effectiveness Discharge Goals: Return home with outpatient referrals for mental health follow-up including medication management/psychotherapy  I certify that inpatient services furnished can reasonably be expected to improve the patient's condition.    Donia Snell, NP 11/18/20254:48 PM

## 2024-01-07 DIAGNOSIS — F10239 Alcohol dependence with withdrawal, unspecified: Secondary | ICD-10-CM | POA: Diagnosis not present

## 2024-01-07 DIAGNOSIS — F10282 Alcohol dependence with alcohol-induced sleep disorder: Secondary | ICD-10-CM | POA: Diagnosis not present

## 2024-01-07 DIAGNOSIS — R9431 Abnormal electrocardiogram [ECG] [EKG]: Secondary | ICD-10-CM | POA: Diagnosis not present

## 2024-01-07 DIAGNOSIS — F1024 Alcohol dependence with alcohol-induced mood disorder: Secondary | ICD-10-CM | POA: Diagnosis not present

## 2024-01-07 MED ORDER — NITROGLYCERIN 0.4 MG SL SUBL
0.4000 mg | SUBLINGUAL_TABLET | SUBLINGUAL | Status: DC | PRN
  Administered 2024-01-07 (×2): 0.4 mg via SUBLINGUAL
  Filled 2024-01-07: qty 1

## 2024-01-07 NOTE — ED Notes (Signed)
 Patient ordered repeat EKG to reassess previously elevated QTC. Upon repeat patient EKG showed STEMI result. EKG reviewed by attending Kandi Hahn, MD. Stat nitroglycerin SL 0.4 mg ordered with instruction to repeat dose in 5 minutes and then complete an additional EKG to assess change. Medications administered with no complications. Patient denied any chest pain, SOB, dizziness, or headache. EKG results reviewed by attending. Patient instructed to follow up with cardiac after discharge.

## 2024-01-07 NOTE — ED Notes (Signed)
 Paitent provided dinner.

## 2024-01-07 NOTE — ED Provider Notes (Signed)
 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 QT normalized but V2 ST elevation present which is a change from 11/16 EKG and not present in any EKG back to 10/2022. Patient asymptomatic. Patient given NTG 0.4mg  x2 then repeat EKG which no longer indicated STEMI by report. Patient counseled on heart health lifestyle changes (alcohol abstinence, tobacco/nicotine  abstinence, stimulant abstinence (legal/illicit), daily exercise, diet).

## 2024-01-07 NOTE — ED Notes (Signed)
 Patient laying down in bedroom calm and composed. No acute distress noted. No concerns voiced. No inappropriate behaviors observed or reported at this time. Informed patient to notify staff with any needs or assistance. Patient verbalized understanding or agreement. Safety checks in place per facility policy.

## 2024-01-07 NOTE — Discharge Instructions (Addendum)
 [facility] Care Management....  Patient requested discharge today  Patient stated he can stay at mother's house until his scheduled appointment  [address]  [place] [address]   RN will arrange transportation   Per Jordan @ [facility] patient has been accepted to complete two part intake process  Writer coordinated patient appointment for January 22, 2024 @ 9:00 am...  [facility] [address] [place], [place] [address] Phone: [phone]  Plan Of Care/Follow-up recommendations:  Activity: Daily physical activity; reduce sedentary behaviors   Diet: Portion-limited diet rich in produce, whole (minimally processed) grains, nuts/seeds, eggs, beans/legumes, seafood, lowfat dairy (if tolerated), fermented food, lean meat. Copious water intake. Limit (ultra)processed foods and sugar-sweetened beverages.    Other: -Follow-up with your outpatient psychiatric provider -instructions on appointment date, time, and address (location) are provided to you in discharge paperwork.   -Take your psychiatric medications as prescribed at discharge - instructions are provided to you in the discharge paperwork (mirtazapine , naltrexone , melatonin)   -Follow-up with outpatient primary care doctor to optimize health maintenance/preventative care and other specialists -for management of chronic medical disease. Please followup with cardiology for evaluation of QT prolongation and aberrant EKG.   -Recommend total abstinence from alcohol, tobacco, and other illicit drug use at discharge.    -If your psychiatric symptoms recur, worsen, or if you have side effects to your psychiatric medications, call your outpatient psychiatric provider, 911, [phone] or go to the nearest emergency department.   -If suicidal thoughts occur, immediately call your outpatient psychiatric provider, 911, [phone] or go to the nearest emergency department.

## 2024-01-07 NOTE — Group Note (Signed)
 Group Topic: Relapse and Recovery  Group Date: 01/07/2024 Start Time: 2000 End Time: 2100 Facilitators: Joan Plowman B  Department: [facility]  Number of Participants: 3  Group Focus: abuse issues, community group, and diagnosis education Treatment Modality:  Exposure Therapy, Patient-Centered Therapy, and Spiritual Interventions utilized were leisure development and support Purpose: enhance coping skills, relapse prevention strategies, and trigger / craving management  Name: Chad Tanner Date of Birth: 13-Oct-1990  MR: [id number]    Level of Participation: PT DID NOT ATTEND GROUPS   Patients Problems:  Patient Active Problem List   Diagnosis Date Noted   Abnormal EKG 01/05/2024   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

## 2024-01-07 NOTE — ED Notes (Signed)

## 2024-01-07 NOTE — Group Note (Signed)
 Group Topic: Social Support  Group Date: 01/07/2024 Start Time: 1330 End Time: 1400 Facilitators: Hyacinth Marcelli, Zane HERO, RN  Department: [facility]  Number of Participants: 1  Group Focus: check in Treatment Modality:  Individual Therapy Interventions utilized were support Purpose: express feelings  Name: SOL ENGLERT Date of Birth: 05/08/1990  MR: [id number]    Level of Participation: minimal Quality of Participation: cooperative Interactions with others: gave feedback Mood/Affect: irritable Triggers (if applicable): None identified at this time Cognition: coherent/clear Progress: Gaining insight Response: Patient minimal in response but denies any concerns at this time Plan: patient will be encouraged to continue to attend groups/programming on the unit  Patients Problems:  Patient Active Problem List   Diagnosis Date Noted   Abnormal EKG 01/05/2024   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

## 2024-01-07 NOTE — ED Notes (Signed)
 Patient resting with eyes closed in no apparent acute distress. Respirations even and unlabored. Environment secured. Safety checks in place according to facility policy.

## 2024-01-07 NOTE — ED Notes (Signed)
 Paitent provided breakfast.

## 2024-01-07 NOTE — ED Provider Notes (Signed)
 Behavioral Health Progress Note  Date and Time: 01/07/2024 4:41 PM Name: Chad Tanner MRN:  [id number]  Subjective:  Patient feeling alright today. Sleep improved with medication but still not fully restorative. Mirtazapine and naltrexone  well-tolerated. Positive outlook on plan to discharge to transitional relapse prevention program associated with [facility]. Patient concerned about getting clothing but will make a call. Cravings are present but manageable.   Diagnosis:  Final diagnoses:  Alcohol dependence with withdrawal with complication (HCC)  Abnormal EKG  Alcohol-induced insomnia (HCC)  Alcohol-induced mood disorder (HCC)   Total Time spent with patient: I personally spent 20 minutes on the unit in direct patient care. The direct patient care time included face-to-face time with the patient, reviewing the patient's chart, communicating with other professionals, and coordinating care. Greater than 50% of this time was spent in counseling or coordinating care with the patient regarding goals of hospitalization, psycho-education, and discharge planning needs.   On my assessment the patient denied SI, HI, AVH, paranoia, ideas of reference, or first rank symptoms. Patient denied drug cravings (except nicotine ) or active signs of withdrawal. Patient denied medication side-effects. Patient was not deemed to be a danger to self or others.    Admission HPI: Pt is reporting that he relapsed on alcohol after doing [facility] for 30 days. He has a history of tachycardia when he stops drinking. He hallucinates sometimes when he is drinking but not in withdrawal and never otherwise. He reports that he has mild tremors from withdrawal currently and no other symptoms.   Patient was accepted to [facility] 11, [facility]. He reports that he was discharged from [facility] on October 10, 2023. He reports a sobriety of 1 month and states that he  relapsed one month ago. He reports drinking alcohol everyday for the past month. On average, a 1/2 gallon of liquor along with 3 (25 oz) beers daily. He states that he last consumed alcohol around 12 AM and drank 3 beers. He denies alcohol withdrawal symptoms at this time. He denies a history of alcohol withdrawal seizures or DT's. He reports history of one inpatient substance abuse rehabilitation program at [facility] in July-Aug (2025). He reports longest period of sobriety 1 to 2 years ago for 2 to 3 months. He denies using illicit drugs. He is currently interested in going to a substance abuse rehabilitation program after he completes detoxification.   He reports feeling depressed since August that he describes as feeling sad every day and decreased energy mentally and physically. He denies depressive symptoms of hopelessness, worthlessness, isolating, crying spells, decreased motivation, anhedonia or irritability. He reports having a poor appetite and difficulty sleeping for the past month due to drinking alcohol everyday. He reports possibly losing 8 pounds since August (2025). He denies suicidal thoughts. He denies a history of suicide attempts. He denies homicidal thoughts. He denies auditory or visual hallucinations. He denies paranoia. Objectively, no signs of acute psychosis.   He currently resides with mother. He has an 33 year old child who does not live with him. 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 He denies legal issues.   He denies a past psychiatric history other than alcohol use. He denies past inpatient psychiatric hospitalizations for mental health other than alcohol detoxification treatment. He denies outpatient psychiatry or counseling. He denies a medical history. He denies taking prescribed medications.  He denies physical complaints on exam. He reports a family substance abuse history of  mother, maternal cousins and maternal aunts and uncle history of alcoholism.   Past Psychiatric History: Pt has done rehab for alcohol once before. No psych hospitalizations or suicide attempts. No other mental health diagnoses though he did take mirtazapine, naltrexone , and trazodone  and reports depressive symptoms and insomnia  Past Medical History: Patient denies any medical history. EKG h/o abnormal Family History: Mother with an irregular heart beat and insomnia. [employer] with a heart problem Social History: Single never marred HS graduate, 1 child age 71 does not live with him. Pt says he lives with mother but told another provider that he lives with a friend.  Pt reports started drinking at age 33 longest period of sobriety was in treatment for 2 months. 1 treatment in the past.  Sleep: Fair  Appetite:  Fair  Current Medications:  Current Facility-Administered Medications  Medication Dose Route Frequency Provider Last Rate Last Admin   acetaminophen  (TYLENOL ) tablet 650 mg  650 mg Oral Q6H PRN White, Patrice L, NP       alum & mag hydroxide-simeth (MAALOX/MYLANTA) 200-200-20 MG/5ML suspension 30 mL  30 mL Oral Q4H PRN White, Patrice L, NP       loperamide  (IMODIUM ) capsule 2-4 mg  2-4 mg Oral PRN White, Patrice L, NP       LORazepam  (ATIVAN ) injection 1 mg  1 mg Intramuscular Continuous PRN Gottfried, Rhoda J, MD       LORazepam  (ATIVAN ) tablet 1 mg  1 mg Oral Q6H PRN White, Patrice L, NP   1 mg at 01/04/24 2132   LORazepam  (ATIVAN ) tablet 1 mg  1 mg Oral BID Gottfried, Rhoda J, MD       Followed by   NOREEN ON 01/09/2024] LORazepam  (ATIVAN ) tablet 1 mg  1 mg Oral Daily Gottfried, Rhoda J, MD       LORazepam  (ATIVAN ) tablet 1 mg  1 mg Oral Continuous PRN Gottfried, Rhoda J, MD       magnesium  hydroxide (MILK OF MAGNESIA) suspension 30 mL  30 mL Oral Daily PRN White, Patrice L, NP       magnesium  oxide (MAG-OX) tablet 200 mg  200 mg Oral BID Lawrnce, Rhoda J, MD   200 mg at  01/07/24 9060   melatonin tablet 5 mg  5 mg Oral QHS Cole Salles C, MD   5 mg at 01/06/24 2122   mirtazapine (REMERON) tablet 15 mg  15 mg Oral QHS Graig Hessling C, MD   15 mg at 01/06/24 2124   multivitamin with minerals tablet 1 tablet  1 tablet Oral Daily White, Patrice L, NP   1 tablet at 01/07/24 0939   naltrexone  (DEPADE) tablet 50 mg  50 mg Oral Landrum Cole Salles JAYSON, MD   50 mg at 01/07/24 9162   thiamine  (VITAMIN B1) tablet 100 mg  100 mg Oral Daily White, Patrice L, NP   100 mg at 01/07/24 9060   Current Outpatient Medications  Medication Sig Dispense Refill   mirtazapine (REMERON) 15 MG tablet Take 15 mg by mouth at bedtime. (Patient taking differently: Take 15 mg by mouth at bedtime as needed (Sleep).)     Multiple Vitamins-Minerals (MULTIVITAMIN ADULTS PO) Take 1 tablet by mouth daily.     thiamine  (VITAMIN B-1) 100 MG tablet Take 100 mg by mouth daily.     traZODone  (DESYREL ) 100 MG tablet Take 100 mg by mouth at bedtime  as needed for sleep.      Labs  Lab Results:  Admission on 01/04/2024, Discharged on 01/04/2024  Component Date Value Ref Range Status   WBC 01/04/2024 8.5  4.0 - 10.5 K/uL Final   RBC 01/04/2024 4.91  4.22 - 5.81 MIL/uL Final   Hemoglobin 01/04/2024 15.1  13.0 - 17.0 g/dL Final   HCT 88/83/7974 43.4  39.0 - 52.0 % Final   MCV 01/04/2024 88.4  80.0 - 100.0 fL Final   MCH 01/04/2024 30.8  26.0 - 34.0 pg Final   MCHC 01/04/2024 34.8  30.0 - 36.0 g/dL Final   RDW 88/83/7974 16.0 (H)  11.5 - 15.5 % Final   Platelets 01/04/2024 235  150 - 400 K/uL Final   nRBC 01/04/2024 0.0  0.0 - 0.2 % Final   Neutrophils Relative % 01/04/2024 76  % Final   Neutro Abs 01/04/2024 6.6  1.7 - 7.7 K/uL Final   Lymphocytes Relative 01/04/2024 17  % Final   Lymphs Abs 01/04/2024 1.4  0.7 - 4.0 K/uL Final   Monocytes Relative 01/04/2024 6  % Final   Monocytes Absolute 01/04/2024 0.5  0.1 - 1.0 K/uL Final   Eosinophils Relative 01/04/2024 0  % Final   Eosinophils  Absolute 01/04/2024 0.0  0.0 - 0.5 K/uL Final   Basophils Relative 01/04/2024 1  % Final   Basophils Absolute 01/04/2024 0.0  0.0 - 0.1 K/uL Final   Immature Granulocytes 01/04/2024 0  % Final   Abs Immature Granulocytes 01/04/2024 0.02  0.00 - 0.07 K/uL Final   Performed at [facility], [address] [place], [place] [address]   Sodium 01/04/2024 138  135 - 145 mmol/L Final   Potassium 01/04/2024 4.4  3.5 - 5.1 mmol/L Final   Chloride 01/04/2024 99  98 - 111 mmol/L Final   CO2 01/04/2024 27  22 - 32 mmol/L Final   Glucose, Bld 01/04/2024 88  70 - 99 mg/dL Final   Glucose reference range applies only to samples taken after fasting for at least 8 hours.   BUN 01/04/2024 10  6 - 20 mg/dL Final   Creatinine, Ser 01/04/2024 1.17  0.61 - 1.24 mg/dL Final   Calcium 88/83/7974 9.3  8.9 - 10.3 mg/dL Final   Total Protein 88/83/7974 7.5  6.5 - 8.1 g/dL Final   Albumin 88/83/7974 4.2  3.5 - 5.0 g/dL Final   AST 88/83/7974 60 (H)  15 - 41 U/L Final   ALT 01/04/2024 17  0 - 44 U/L Final   Alkaline Phosphatase 01/04/2024 56  38 - 126 U/L Final   Total Bilirubin 01/04/2024 1.2  0.0 - 1.2 mg/dL Final   GFR, Estimated 01/04/2024 >60  >60 mL/min Final   Comment: (NOTE) Calculated using the CKD-EPI Creatinine Equation (2021)    Anion gap 01/04/2024 12  5 - 15 Final   Performed at [facility], [address] [place], [place] [address]   Magnesium  01/04/2024 1.9  1.7 - 2.4 mg/dL Final   Performed at [facility], [address], [place], [place] [address]   Alcohol, Ethyl (B) 01/04/2024 <15  <15 mg/dL Final   Comment: (NOTE) For medical purposes only. Performed at [facility], [address], [place], [place] [address]   Admission on 01/04/2024, Discharged on 01/04/2024  Component Date Value Ref Range Status   WBC 01/04/2024 8.8  4.0 - 10.5 K/uL Final   RBC 01/04/2024 4.91  4.22 - 5.81  MIL/uL Final   Hemoglobin 01/04/2024 15.1  13.0 - 17.0 g/dL Final   HCT 88/83/7974  43.7  39.0 - 52.0 % Final   MCV 01/04/2024 89.0  80.0 - 100.0 fL Final   MCH 01/04/2024 30.8  26.0 - 34.0 pg Final   MCHC 01/04/2024 34.6  30.0 - 36.0 g/dL Final   RDW 88/83/7974 16.4 (H)  11.5 - 15.5 % Final   Platelets 01/04/2024 235  150 - 400 K/uL Final   nRBC 01/04/2024 0.0  0.0 - 0.2 % Final   Neutrophils Relative % 01/04/2024 81  % Final   Neutro Abs 01/04/2024 7.1  1.7 - 7.7 K/uL Final   Lymphocytes Relative 01/04/2024 12  % Final   Lymphs Abs 01/04/2024 1.1  0.7 - 4.0 K/uL Final   Monocytes Relative 01/04/2024 7  % Final   Monocytes Absolute 01/04/2024 0.6  0.1 - 1.0 K/uL Final   Eosinophils Relative 01/04/2024 0  % Final   Eosinophils Absolute 01/04/2024 0.0  0.0 - 0.5 K/uL Final   Basophils Relative 01/04/2024 0  % Final   Basophils Absolute 01/04/2024 0.0  0.0 - 0.1 K/uL Final   Immature Granulocytes 01/04/2024 0  % Final   Abs Immature Granulocytes 01/04/2024 0.02  0.00 - 0.07 K/uL Final   Performed at [facility], [address], [place], [place] [address]   Sodium 01/04/2024 140  135 - 145 mmol/L Final   Potassium 01/04/2024 4.3  3.5 - 5.1 mmol/L Final   Chloride 01/04/2024 101  98 - 111 mmol/L Final   CO2 01/04/2024 25  22 - 32 mmol/L Final   Glucose, Bld 01/04/2024 64 (L)  70 - 99 mg/dL Final   Glucose reference range applies only to samples taken after fasting for at least 8 hours.   BUN 01/04/2024 10  6 - 20 mg/dL Final   Creatinine, Ser 01/04/2024 0.98  0.61 - 1.24 mg/dL Final   Calcium 88/83/7974 9.0  8.9 - 10.3 mg/dL Final   Total Protein 88/83/7974 7.4  6.5 - 8.1 g/dL Final   Albumin 88/83/7974 4.3  3.5 - 5.0 g/dL Final   AST 88/83/7974 60 (H)  15 - 41 U/L Final   ALT 01/04/2024 17  0 - 44 U/L Final   Alkaline Phosphatase 01/04/2024 56  38 - 126 U/L Final   Total Bilirubin 01/04/2024 1.4 (H)  0.0 - 1.2 mg/dL Final   GFR, Estimated 01/04/2024 >60  >60 mL/min Final   Comment: (NOTE) Calculated using the CKD-EPI Creatinine Equation (2021)    Anion gap  01/04/2024 14  5 - 15 Final   Performed at [facility], [address], [place], [place] [address]   Hgb A1c MFr Bld 01/04/2024 5.5  4.8 - 5.6 % Final   Comment: (NOTE) Diagnosis of Diabetes The following HbA1c ranges recommended by the American Diabetes Association (ADA) may be used as an aid in the diagnosis of diabetes mellitus.  Hemoglobin             Suggested A1C NGSP%              Diagnosis  <5.7                   Non Diabetic  5.7-6.4                Pre-Diabetic  >6.4                   Diabetic  <  7.0                   Glycemic control for                       adults with diabetes.     Mean Plasma Glucose 01/04/2024 111.15  mg/dL Final   Performed at [facility], [address], [place], [place] [address]   Alcohol, Ethyl (B) 01/04/2024 55 (H)  <15 mg/dL Final   Comment: (NOTE) For medical purposes only. Performed at [facility], [address], [place], [place] [address]    Cholesterol 01/04/2024 223 (H)  0 - 200 mg/dL Final   Triglycerides 88/83/7974 38  <150 mg/dL Final   HDL 88/83/7974 124  >40 mg/dL Final   Total CHOL/HDL Ratio 01/04/2024 1.8  RATIO Final   VLDL 01/04/2024 8  0 - 40 mg/dL Final   LDL Cholesterol 01/04/2024 91  0 - 99 mg/dL Final   Comment:        Total Cholesterol/HDL:CHD Risk Coronary Heart Disease Risk Table                     Men   Women  1/2 Average Risk   3.4   3.3  Average Risk       5.0   4.4  2 X Average Risk   9.6   7.1  3 X Average Risk  23.4   11.0        Use the calculated Patient Ratio above and the CHD Risk Table to determine the patient's CHD Risk.        ATP III CLASSIFICATION (LDL):  <100     mg/dL   Optimal  899-870  mg/dL   Near or Above                    Optimal  130-159  mg/dL   Borderline  839-810  mg/dL   High  >809     mg/dL   Very High Performed at [facility], [address] [place], [place] [address]    TSH 01/04/2024 0.392  0.350 - 4.500 uIU/mL Final   Comment: Performed  by a 3rd Generation assay with a functional sensitivity of <=0.01 uIU/mL. Performed at [facility], [address], [place], [place] [address]    POC Amphetamine UR 01/04/2024 None Detected  NONE DETECTED (Cut Off Level 1000 ng/mL) Final   POC Secobarbital (BAR) 01/04/2024 None Detected  NONE DETECTED (Cut Off Level 300 ng/mL) Final   POC Buprenorphine (BUP) 01/04/2024 None Detected  NONE DETECTED (Cut Off Level 10 ng/mL) Final   POC Oxazepam (BZO) 01/04/2024 None Detected  NONE DETECTED (Cut Off Level 300 ng/mL) Final   POC Cocaine UR 01/04/2024 None Detected  NONE DETECTED (Cut Off Level 300 ng/mL) Final   POC Methamphetamine UR 01/04/2024 None Detected  NONE DETECTED (Cut Off Level 1000 ng/mL) Final   POC Morphine 01/04/2024 None Detected  NONE DETECTED (Cut Off Level 300 ng/mL) Final   POC Methadone UR 01/04/2024 None Detected  NONE DETECTED (Cut Off Level 300 ng/mL) Final   POC Oxycodone  UR 01/04/2024 None Detected  NONE DETECTED (Cut Off Level 100 ng/mL) Final   POC Marijuana UR 01/04/2024 None Detected  NONE DETECTED (Cut Off Level 50 ng/mL) Final  Admission on 09/06/2023, Discharged on 09/06/2023  Component Date Value Ref Range Status   WBC 09/06/2023 7.6  4.0 - 10.5 K/uL Final   RBC 09/06/2023 4.46  4.22 - 5.81 MIL/uL Final   Hemoglobin 09/06/2023 13.9  13.0 - 17.0 g/dL Final   HCT 92/80/7974 39.7  39.0 - 52.0 % Final   MCV 09/06/2023 89.0  80.0 - 100.0 fL Final   MCH 09/06/2023 31.2  26.0 - 34.0 pg Final   MCHC 09/06/2023 35.0  30.0 - 36.0 g/dL Final   RDW 92/80/7974 14.2  11.5 - 15.5 % Final   Platelets 09/06/2023 198  150 - 400 K/uL Final   nRBC 09/06/2023 0.0  0.0 - 0.2 % Final   Neutrophils Relative % 09/06/2023 60  % Final   Neutro Abs 09/06/2023 4.6  1.7 - 7.7 K/uL Final   Lymphocytes Relative 09/06/2023 34  % Final   Lymphs Abs 09/06/2023 2.6  0.7 - 4.0 K/uL Final   Monocytes Relative 09/06/2023 5  % Final   Monocytes Absolute 09/06/2023 0.4  0.1 - 1.0 K/uL Final    Eosinophils Relative 09/06/2023 0  % Final   Eosinophils Absolute 09/06/2023 0.0  0.0 - 0.5 K/uL Final   Basophils Relative 09/06/2023 1  % Final   Basophils Absolute 09/06/2023 0.1  0.0 - 0.1 K/uL Final   Immature Granulocytes 09/06/2023 0  % Final   Abs Immature Granulocytes 09/06/2023 0.01  0.00 - 0.07 K/uL Final   Performed at [facility], [address] [place], [place] [address]   Sodium 09/06/2023 142  135 - 145 mmol/L Final   Potassium 09/06/2023 3.7  3.5 - 5.1 mmol/L Final   Chloride 09/06/2023 105  98 - 111 mmol/L Final   CO2 09/06/2023 22  22 - 32 mmol/L Final   Glucose, Bld 09/06/2023 93  70 - 99 mg/dL Final   Glucose reference range applies only to samples taken after fasting for at least 8 hours.   BUN 09/06/2023 8  6 - 20 mg/dL Final   Creatinine, Ser 09/06/2023 1.03  0.61 - 1.24 mg/dL Final   Calcium 92/80/7974 9.1  8.9 - 10.3 mg/dL Final   Total Protein 92/80/7974 7.3  6.5 - 8.1 g/dL Final   Albumin 92/80/7974 4.4  3.5 - 5.0 g/dL Final   AST 92/80/7974 76 (H)  15 - 41 U/L Final   ALT 09/06/2023 37  0 - 44 U/L Final   Alkaline Phosphatase 09/06/2023 49  38 - 126 U/L Final   Total Bilirubin 09/06/2023 0.9  0.0 - 1.2 mg/dL Final   GFR, Estimated 09/06/2023 >60  >60 mL/min Final   Comment: (NOTE) Calculated using the CKD-EPI Creatinine Equation (2021)    Anion gap 09/06/2023 15  5 - 15 Final   Performed at [facility], [address] [place], [place] [address]   Alcohol, Ethyl (B) 09/06/2023 198 (H)  <15 mg/dL Final   Comment: (NOTE) For medical purposes only. Performed at [facility], [address], [place], [place] [address]    POC Amphetamine UR 09/06/2023 None Detected  NONE DETECTED (Cut Off Level 1000 ng/mL) Final   POC Secobarbital (BAR) 09/06/2023 None Detected  NONE DETECTED (Cut Off Level 300 ng/mL) Final   POC Buprenorphine (BUP) 09/06/2023 None Detected  NONE DETECTED (Cut Off Level 10 ng/mL) Final   POC Oxazepam (BZO)  09/06/2023 None Detected  NONE DETECTED (Cut Off Level 300 ng/mL) Final   POC Cocaine UR 09/06/2023 None Detected  NONE DETECTED (Cut Off Level 300 ng/mL) Final   POC Methamphetamine UR 09/06/2023 None  Detected  NONE DETECTED (Cut Off Level 1000 ng/mL) Final   POC Morphine 09/06/2023 None Detected  NONE DETECTED (Cut Off Level 300 ng/mL) Final   POC Methadone UR 09/06/2023 None Detected  NONE DETECTED (Cut Off Level 300 ng/mL) Final   POC Oxycodone  UR 09/06/2023 None Detected  NONE DETECTED (Cut Off Level 100 ng/mL) Final   POC Marijuana UR 09/06/2023 None Detected  NONE DETECTED (Cut Off Level 50 ng/mL) Final   Magnesium  09/06/2023 1.9  1.7 - 2.4 mg/dL Final   Performed at [facility], [address], [place], [place] [address]    Blood Alcohol level:  Lab Results  Component Value Date   ETH <15 01/04/2024   ETH 55 (H) 01/04/2024    Metabolic Disorder Labs: Lab Results  Component Value Date   HGBA1C 5.5 01/04/2024   MPG 111.15 01/04/2024   No results found for: PROLACTIN Lab Results  Component Value Date   CHOL 223 (H) 01/04/2024   TRIG 38 01/04/2024   HDL 124 01/04/2024   CHOLHDL 1.8 01/04/2024   VLDL 8 01/04/2024   LDLCALC 91 01/04/2024    Therapeutic Lab Levels: No results found for: LITHIUM No results found for: VALPROATE No results found for: CBMZ  Physical Findings   PHQ2-9    Flowsheet Row ED from 01/04/2024 in [facility] ED from 09/06/2023 in [facility] ED from 11/15/2022 in [facility]  PHQ-2 Total Score 1 0 4  PHQ-9 Total Score -- 9 10   Flowsheet Row ED from 01/04/2024 in [facility] Most recent reading at 01/04/2024  8:27 PM ED from 01/04/2024 in [facility] Emergency Department at [facility] Most recent reading at 01/04/2024  1:23 PM ED from 01/04/2024 in [facility] Most recent reading  at 01/04/2024 11:24 AM  C-SSRS RISK CATEGORY No Risk No Risk No Risk     Musculoskeletal  Strength & Muscle Tone: within normal limits Gait & Station: normal Patient leans: N/A  Psychiatric Specialty Exam  Presentation  General Appearance:  Appropriate for Environment  Eye Contact: Fair  Speech: Clear and Coherent  Speech Volume: Normal  Handedness: Right   Mood and Affect  Mood: Anxious  Affect: Congruent   Thought Process  Thought Processes: Coherent  Descriptions of Associations:Intact  Orientation:Full (Time, Place and Person)  Thought Content:Logical  Diagnosis of Schizophrenia or Schizoaffective disorder in past: No    Hallucinations:Hallucinations: None  Ideas of Reference:None  Suicidal Thoughts:Suicidal Thoughts: No  Homicidal Thoughts:Homicidal Thoughts: No   Sensorium  Memory: Immediate Fair  Judgment: Fair  Insight: Fair   Chartered Certified Accountant: Fair  Attention Span: Fair  Recall: [employer] of Knowledge: Fair  Language: Fair   Psychomotor Activity  Psychomotor Activity: Psychomotor Activity: Normal   Assets  Assets: Communication Skills; Desire for Improvement; Resilience   Sleep  Sleep: Sleep: Fair (not fully restorative)  Estimated Sleeping Duration (Last 24 Hours): 13.00-14.50 hours (Due to Daylight Saving Time, the durations displayed may not accurately represent documentation during the time change interval)  Nutritional Assessment (For OBS and FBC admissions only) Has the patient had a weight loss or gain of 10 pounds or more in the last 3 months?: No Has the patient had a decrease in food intake/or appetite?: No Does the patient have dental problems?: No Does the patient have eating habits or behaviors that may be indicators of an eating disorder  including binging or inducing vomiting?: No Has the patient recently lost weight without trying?: 0 Has the patient been eating poorly  because of a decreased appetite?: 0 Malnutrition Screening Tool Score: 0    Physical Exam  Physical Exam ROS Blood pressure 120/80, pulse 60, temperature 97.7 F (36.5 C), temperature source Oral, resp. rate 17, SpO2 99%. There is no height or weight on file to calculate BMI.  Treatment Plan Summary: 33 year old male with a family history of anxiety and 12 year history of heavy alcohol consumption consistent with alcohol use disorder (heavy/binge) with mood/anxiety/insomnia components. Lorazepam  detox effective, well-tolerated with last dose planned for 11/21 but patient to discharge tomorrow to transitional placement prior to [facility] residential treatment. Melatonin increase to 5mg  has improved sleep. Naltrexone  and mirtazapine well-tolerated. Will repeat EKG prior to discharge.   KANDI JAYSON HAHN, MD 01/07/2024 4:41 PM

## 2024-01-07 NOTE — ED Notes (Signed)
 Pt asleep at the moment. NAD, Will keep monitoring for safety.

## 2024-01-07 NOTE — ED Notes (Signed)
 Pt is sleeping, no acute distress noted. Respirations are even and unlabored. Q15 safety checks in place.

## 2024-01-07 NOTE — ED Notes (Signed)
 Paitent provided lunch.

## 2024-01-07 NOTE — Group Note (Signed)
 Group Topic: Positive Affirmations  Group Date: 01/07/2024 Start Time: 1300 End Time: 1326 Facilitators: Veverly Oddis BRAVO, NT  Department: [facility]  Number of Participants: 6  Group Focus: Positive Traits  Treatment Modality:  Cognitive Behavioral Therapy Interventions utilized were orientation Purpose: regain self-worth  Name: Chad Tanner Date of Birth: [birth year]  MR: [id number]    Level of Participation: active Quality of Participation: cooperative Interactions with others: gave feedback Mood/Affect: brightens with interaction Triggers (if applicable): none Cognition: coherent/clear Progress: Moderate Response: I am balanced, brave and determined. Plan: patient will be encouraged to reflect on other positive traits as well and encouraged to keep attending group.  Patients Problems:  Patient Active Problem List   Diagnosis Date Noted   Abnormal EKG 01/05/2024   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

## 2024-01-08 DIAGNOSIS — F10282 Alcohol dependence with alcohol-induced sleep disorder: Secondary | ICD-10-CM | POA: Diagnosis not present

## 2024-01-08 DIAGNOSIS — F10239 Alcohol dependence with withdrawal, unspecified: Secondary | ICD-10-CM | POA: Diagnosis not present

## 2024-01-08 DIAGNOSIS — F1024 Alcohol dependence with alcohol-induced mood disorder: Secondary | ICD-10-CM | POA: Diagnosis not present

## 2024-01-08 DIAGNOSIS — R9431 Abnormal electrocardiogram [ECG] [EKG]: Secondary | ICD-10-CM | POA: Diagnosis not present

## 2024-01-08 MED ORDER — CLONIDINE HCL 0.1 MG PO TABS
0.1000 mg | ORAL_TABLET | Freq: Once | ORAL | Status: AC
  Administered 2024-01-08: 0.1 mg via ORAL
  Filled 2024-01-08: qty 1

## 2024-01-08 NOTE — ED Notes (Signed)
 Pt denies si hi avh- verbal contract for safety provided. Pt reports feeling 'okay'. Pt ate breakfast, denies physical pain and discomforts. Pt expressed readiness for discharge. Medications reviewed, questions denied.

## 2024-01-08 NOTE — ED Provider Notes (Signed)
 Behavioral Health Progress Note  Date and Time: 01/08/2024 5:34 PM Name: Chad Tanner MRN:  [id number]  Subjective:  Chad Tanner is good today. Sleep was good. No issues overnight. No medication concerns. Understands general heart healthy behaviors such as alcohol abstinence, reducing cigarette smoking, limiting other forms of nicotine , improved diet, and daily physical activity. Positive about transition to relapse prevention program but aware that plan may now include a different program until [facility] is available on 12/4.  Diagnosis:  Final diagnoses:  Alcohol dependence with withdrawal with complication (HCC)  Abnormal EKG  Alcohol-induced insomnia (HCC)  Alcohol-induced mood disorder (HCC)   Total Time spent with patient: I personally spent 10 minutes on the unit in direct patient care. The direct patient care time included face-to-face time with the patient, reviewing the patient's chart, communicating with other professionals, and coordinating care. Greater than 50% of this time was spent in counseling or coordinating care with the patient regarding goals of hospitalization, psycho-education, and discharge planning needs.   On my assessment the patient denied SI, HI, AVH, paranoia, ideas of reference, or first rank symptoms. Patient denied drug cravings (except nicotine ) or active signs of withdrawal. Patient denied medication side-effects. Patient was not deemed to be a danger to self or others.    Admission HPI: Pt is reporting that he relapsed on alcohol after doing [facility] for 30 days. He has a history of tachycardia when he stops drinking. He hallucinates sometimes when he is drinking but not in withdrawal and never otherwise. He reports that he has mild tremors from withdrawal currently and no other symptoms.   Patient was [facility]. He reports that he was discharged from [facility] on October 10, 2023. He reports a sobriety of 1 month and states that he relapsed one month ago. He reports drinking alcohol everyday for the past month. On average, a 1/2 gallon of liquor along with 3 (25 oz) beers daily. He states that he last consumed alcohol around 12 AM and drank 3 beers. He denies alcohol withdrawal symptoms at this time. He denies a history of alcohol withdrawal seizures or DT's. He reports history of one inpatient substance abuse rehabilitation program at [facility] in July-Aug (2025). He reports longest period of sobriety 1 to 2 years ago for 2 to 3 months. He denies using illicit drugs. He is currently interested in going to a substance abuse rehabilitation program after he completes detoxification.   He reports feeling depressed since August that he describes as feeling sad every day and decreased energy mentally and physically. He denies depressive symptoms of hopelessness, worthlessness, isolating, crying spells, decreased motivation, anhedonia or irritability. He reports having a poor appetite and difficulty sleeping for the past month due to drinking alcohol everyday. He reports possibly losing 8 pounds since August (2025). He denies suicidal thoughts. He denies a history of suicide attempts. He denies homicidal thoughts. He denies auditory or visual hallucinations. He denies paranoia. Objectively, no signs of acute psychosis.   He currently resides with Chad Tanner. He has an 64 year old child who does not live with him. 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 He denies legal issues.   He denies a past psychiatric history other than alcohol use. He denies past inpatient psychiatric hospitalizations for mental health other than alcohol detoxification treatment. He denies outpatient  psychiatry or counseling. He denies a medical history. He denies taking prescribed medications. He denies physical  complaints on exam. He reports a family substance abuse history of Chad Tanner, maternal cousins and maternal aunts and uncle history of alcoholism.  Sleep: Good  Appetite:  Good  Current Medications:  Current Facility-Administered Medications  Medication Dose Route Frequency Provider Last Rate Last Admin   acetaminophen  (TYLENOL ) tablet 650 mg  650 mg Oral Q6H PRN White, Patrice L, NP       alum & mag hydroxide-simeth (MAALOX/MYLANTA) 200-200-20 MG/5ML suspension 30 mL  30 mL Oral Q4H PRN White, Patrice L, NP       LORazepam  (ATIVAN ) injection 1 mg  1 mg Intramuscular Continuous PRN Gottfried, Rhoda J, MD       [START ON 01/09/2024] LORazepam  (ATIVAN ) tablet 1 mg  1 mg Oral Daily Gottfried, Rhoda J, MD       LORazepam  (ATIVAN ) tablet 1 mg  1 mg Oral Continuous PRN Gottfried, Rhoda J, MD       magnesium  hydroxide (MILK OF MAGNESIA) suspension 30 mL  30 mL Oral Daily PRN White, Patrice L, NP       magnesium  oxide (MAG-OX) tablet 200 mg  200 mg Oral BID Lawrnce, Rhoda J, MD   200 mg at 01/08/24 9095   melatonin tablet 5 mg  5 mg Oral QHS Cole Salles C, MD   5 mg at 01/07/24 2121   mirtazapine (REMERON) tablet 15 mg  15 mg Oral QHS Meredeth Furber C, MD   15 mg at 01/07/24 2121   multivitamin with minerals tablet 1 tablet  1 tablet Oral Daily White, Patrice L, NP   1 tablet at 01/08/24 0905   naltrexone  (DEPADE) tablet 50 mg  50 mg Oral BH-q7a Amika Tassin C, MD   50 mg at 01/08/24 0633   nitroGLYCERIN (NITROSTAT) SL tablet 0.4 mg  0.4 mg Sublingual Q5 min PRN Cole Salles BROCKS, MD   0.4 mg at 01/07/24 1910   thiamine  (VITAMIN B1) tablet 100 mg  100 mg Oral Daily White, Patrice L, NP   100 mg at 01/08/24 0906   Current Outpatient Medications  Medication Sig Dispense Refill   mirtazapine (REMERON) 15 MG tablet Take 15 mg by mouth at bedtime. (Patient taking differently: Take 15 mg by mouth at bedtime as needed (Sleep).)     Multiple Vitamins-Minerals (MULTIVITAMIN ADULTS PO) Take 1  tablet by mouth daily.     thiamine  (VITAMIN B-1) 100 MG tablet Take 100 mg by mouth daily.     traZODone  (DESYREL ) 100 MG tablet Take 100 mg by mouth at bedtime as needed for sleep.      Labs  Lab Results:  Admission on 01/04/2024, Discharged on 01/04/2024  Component Date Value Ref Range Status   WBC 01/04/2024 8.5  4.0 - 10.5 K/uL Final   RBC 01/04/2024 4.91  4.22 - 5.81 MIL/uL Final   Hemoglobin 01/04/2024 15.1  13.0 - 17.0 g/dL Final   HCT 88/83/7974 43.4  39.0 - 52.0 % Final   MCV 01/04/2024 88.4  80.0 - 100.0 fL Final   MCH 01/04/2024 30.8  26.0 - 34.0 pg Final   MCHC 01/04/2024 34.8  30.0 - 36.0 g/dL Final   RDW 88/83/7974 16.0 (H)  11.5 - 15.5 % Final   Platelets 01/04/2024 235  150 - 400 K/uL Final   nRBC 01/04/2024 0.0  0.0 - 0.2 % Final   Neutrophils Relative % 01/04/2024 76  % Final  Neutro Abs 01/04/2024 6.6  1.7 - 7.7 K/uL Final   Lymphocytes Relative 01/04/2024 17  % Final   Lymphs Abs 01/04/2024 1.4  0.7 - 4.0 K/uL Final   Monocytes Relative 01/04/2024 6  % Final   Monocytes Absolute 01/04/2024 0.5  0.1 - 1.0 K/uL Final   Eosinophils Relative 01/04/2024 0  % Final   Eosinophils Absolute 01/04/2024 0.0  0.0 - 0.5 K/uL Final   Basophils Relative 01/04/2024 1  % Final   Basophils Absolute 01/04/2024 0.0  0.0 - 0.1 K/uL Final   Immature Granulocytes 01/04/2024 0  % Final   Abs Immature Granulocytes 01/04/2024 0.02  0.00 - 0.07 K/uL Final   Performed at [facility], [address] [place], [place] [address]   Sodium 01/04/2024 138  135 - 145 mmol/L Final   Potassium 01/04/2024 4.4  3.5 - 5.1 mmol/L Final   Chloride 01/04/2024 99  98 - 111 mmol/L Final   CO2 01/04/2024 27  22 - 32 mmol/L Final   Glucose, Bld 01/04/2024 88  70 - 99 mg/dL Final   Glucose reference range applies only to samples taken after fasting for at least 8 hours.   BUN 01/04/2024 10  6 - 20 mg/dL Final   Creatinine, Ser 01/04/2024 1.17  0.61 - 1.24 mg/dL Final   Calcium 88/83/7974 9.3   8.9 - 10.3 mg/dL Final   Total Protein 88/83/7974 7.5  6.5 - 8.1 g/dL Final   Albumin 88/83/7974 4.2  3.5 - 5.0 g/dL Final   AST 88/83/7974 60 (H)  15 - 41 U/L Final   ALT 01/04/2024 17  0 - 44 U/L Final   Alkaline Phosphatase 01/04/2024 56  38 - 126 U/L Final   Total Bilirubin 01/04/2024 1.2  0.0 - 1.2 mg/dL Final   GFR, Estimated 01/04/2024 >60  >60 mL/min Final   Comment: (NOTE) Calculated using the CKD-EPI Creatinine Equation (2021)    Anion gap 01/04/2024 12  5 - 15 Final   Performed at [facility], [address], [place], [place] [address]   Magnesium  01/04/2024 1.9  1.7 - 2.4 mg/dL Final   Performed at [facility], [address], [place], [place] [address]   Alcohol, Ethyl (B) 01/04/2024 <15  <15 mg/dL Final   Comment: (NOTE) For medical purposes only. Performed at [facility], [address] [place], [place] [address]   Admission on 01/04/2024, Discharged on 01/04/2024  Component Date Value Ref Range Status   WBC 01/04/2024 8.8  4.0 - 10.5 K/uL Final   RBC 01/04/2024 4.91  4.22 - 5.81 MIL/uL Final   Hemoglobin 01/04/2024 15.1  13.0 - 17.0 g/dL Final   HCT 88/83/7974 43.7  39.0 - 52.0 % Final   MCV 01/04/2024 89.0  80.0 - 100.0 fL Final   MCH 01/04/2024 30.8  26.0 - 34.0 pg Final   MCHC 01/04/2024 34.6  30.0 - 36.0 g/dL Final   RDW 88/83/7974 16.4 (H)  11.5 - 15.5 % Final   Platelets 01/04/2024 235  150 - 400 K/uL Final   nRBC 01/04/2024 0.0  0.0 - 0.2 % Final   Neutrophils Relative % 01/04/2024 81  % Final   Neutro Abs 01/04/2024 7.1  1.7 - 7.7 K/uL Final   Lymphocytes Relative 01/04/2024 12  % Final   Lymphs Abs 01/04/2024 1.1  0.7 - 4.0 K/uL Final   Monocytes Relative 01/04/2024 7  % Final   Monocytes Absolute 01/04/2024 0.6  0.1 - 1.0 K/uL Final   Eosinophils Relative 01/04/2024 0  % Final   Eosinophils Absolute 01/04/2024 0.0  0.0 - 0.5 K/uL Final   Basophils Relative 01/04/2024 0  % Final   Basophils Absolute 01/04/2024 0.0  0.0 - 0.1  K/uL Final   Immature Granulocytes 01/04/2024 0  % Final   Abs Immature Granulocytes 01/04/2024 0.02  0.00 - 0.07 K/uL Final   Performed at [facility], [address], [place], [place] [address]   Sodium 01/04/2024 140  135 - 145 mmol/L Final   Potassium 01/04/2024 4.3  3.5 - 5.1 mmol/L Final   Chloride 01/04/2024 101  98 - 111 mmol/L Final   CO2 01/04/2024 25  22 - 32 mmol/L Final   Glucose, Bld 01/04/2024 64 (L)  70 - 99 mg/dL Final   Glucose reference range applies only to samples taken after fasting for at least 8 hours.   BUN 01/04/2024 10  6 - 20 mg/dL Final   Creatinine, Ser 01/04/2024 0.98  0.61 - 1.24 mg/dL Final   Calcium 88/83/7974 9.0  8.9 - 10.3 mg/dL Final   Total Protein 88/83/7974 7.4  6.5 - 8.1 g/dL Final   Albumin 88/83/7974 4.3  3.5 - 5.0 g/dL Final   AST 88/83/7974 60 (H)  15 - 41 U/L Final   ALT 01/04/2024 17  0 - 44 U/L Final   Alkaline Phosphatase 01/04/2024 56  38 - 126 U/L Final   Total Bilirubin 01/04/2024 1.4 (H)  0.0 - 1.2 mg/dL Final   GFR, Estimated 01/04/2024 >60  >60 mL/min Final   Comment: (NOTE) Calculated using the CKD-EPI Creatinine Equation (2021)    Anion gap 01/04/2024 14  5 - 15 Final   Performed at [facility], [address], [place], [place] [address]   Hgb A1c MFr Bld 01/04/2024 5.5  4.8 - 5.6 % Final   Comment: (NOTE) Diagnosis of Diabetes The following HbA1c ranges recommended by the American Diabetes Association (ADA) may be used as an aid in the diagnosis of diabetes mellitus.  Hemoglobin             Suggested A1C NGSP%              Diagnosis  <5.7                   Non Diabetic  5.7-6.4                Pre-Diabetic  >6.4                   Diabetic  <7.0                   Glycemic control for                       adults with diabetes.     Mean Plasma Glucose 01/04/2024 111.15  mg/dL Final   Performed at [facility], [address], [place], [place] [address]   Alcohol, Ethyl (B) 01/04/2024 55 (H)  <15  mg/dL Final   Comment: (NOTE) For medical purposes only. Performed at [facility], [address] [place], [place] [address]    Cholesterol 01/04/2024 223 (H)  0 - 200 mg/dL Final   Triglycerides 88/83/7974 38  <150 mg/dL Final   HDL 88/83/7974 124  >40 mg/dL Final   Total CHOL/HDL Ratio 01/04/2024 1.8  RATIO Final  VLDL 01/04/2024 8  0 - 40 mg/dL Final   LDL Cholesterol 01/04/2024 91  0 - 99 mg/dL Final   Comment:        Total Cholesterol/HDL:CHD Risk Coronary Heart Disease Risk Table                     Men   Women  1/2 Average Risk   3.4   3.3  Average Risk       5.0   4.4  2 X Average Risk   9.6   7.1  3 X Average Risk  23.4   11.0        Use the calculated Patient Ratio above and the CHD Risk Table to determine the patient's CHD Risk.        ATP III CLASSIFICATION (LDL):  <100     mg/dL   Optimal  899-870  mg/dL   Near or Above                    Optimal  130-159  mg/dL   Borderline  839-810  mg/dL   High  >809     mg/dL   Very High Performed at [facility], [address], [place], [place] [address]    TSH 01/04/2024 0.392  0.350 - 4.500 uIU/mL Final   Comment: Performed by a 3rd Generation assay with a functional sensitivity of <=0.01 uIU/mL. Performed at [facility], [address] [place], [place] [address]    POC Amphetamine UR 01/04/2024 None Detected  NONE DETECTED (Cut Off Level 1000 ng/mL) Final   POC Secobarbital (BAR) 01/04/2024 None Detected  NONE DETECTED (Cut Off Level 300 ng/mL) Final   POC Buprenorphine (BUP) 01/04/2024 None Detected  NONE DETECTED (Cut Off Level 10 ng/mL) Final   POC Oxazepam (BZO) 01/04/2024 None Detected  NONE DETECTED (Cut Off Level 300 ng/mL) Final   POC Cocaine UR 01/04/2024 None Detected  NONE DETECTED (Cut Off Level 300 ng/mL) Final   POC Methamphetamine UR 01/04/2024 None Detected  NONE DETECTED (Cut Off Level 1000 ng/mL) Final   POC Morphine 01/04/2024 None Detected  NONE DETECTED (Cut Off Level 300  ng/mL) Final   POC Methadone UR 01/04/2024 None Detected  NONE DETECTED (Cut Off Level 300 ng/mL) Final   POC Oxycodone  UR 01/04/2024 None Detected  NONE DETECTED (Cut Off Level 100 ng/mL) Final   POC Marijuana UR 01/04/2024 None Detected  NONE DETECTED (Cut Off Level 50 ng/mL) Final  Admission on 09/06/2023, Discharged on 09/06/2023  Component Date Value Ref Range Status   WBC 09/06/2023 7.6  4.0 - 10.5 K/uL Final   RBC 09/06/2023 4.46  4.22 - 5.81 MIL/uL Final   Hemoglobin 09/06/2023 13.9  13.0 - 17.0 g/dL Final   HCT 92/80/7974 39.7  39.0 - 52.0 % Final   MCV 09/06/2023 89.0  80.0 - 100.0 fL Final   MCH 09/06/2023 31.2  26.0 - 34.0 pg Final   MCHC 09/06/2023 35.0  30.0 - 36.0 g/dL Final   RDW 92/80/7974 14.2  11.5 - 15.5 % Final   Platelets 09/06/2023 198  150 - 400 K/uL Final   nRBC 09/06/2023 0.0  0.0 - 0.2 % Final   Neutrophils Relative % 09/06/2023 60  % Final   Neutro Abs 09/06/2023 4.6  1.7 - 7.7 K/uL Final   Lymphocytes Relative 09/06/2023 34  % Final   Lymphs Abs 09/06/2023 2.6  0.7 - 4.0 K/uL Final  Monocytes Relative 09/06/2023 5  % Final   Monocytes Absolute 09/06/2023 0.4  0.1 - 1.0 K/uL Final   Eosinophils Relative 09/06/2023 0  % Final   Eosinophils Absolute 09/06/2023 0.0  0.0 - 0.5 K/uL Final   Basophils Relative 09/06/2023 1  % Final   Basophils Absolute 09/06/2023 0.1  0.0 - 0.1 K/uL Final   Immature Granulocytes 09/06/2023 0  % Final   Abs Immature Granulocytes 09/06/2023 0.01  0.00 - 0.07 K/uL Final   Performed at [facility], [address] [place], [place] [address]   Sodium 09/06/2023 142  135 - 145 mmol/L Final   Potassium 09/06/2023 3.7  3.5 - 5.1 mmol/L Final   Chloride 09/06/2023 105  98 - 111 mmol/L Final   CO2 09/06/2023 22  22 - 32 mmol/L Final   Glucose, Bld 09/06/2023 93  70 - 99 mg/dL Final   Glucose reference range applies only to samples taken after fasting for at least 8 hours.   BUN 09/06/2023 8  6 - 20 mg/dL Final   Creatinine, Ser  09/06/2023 1.03  0.61 - 1.24 mg/dL Final   Calcium 92/80/7974 9.1  8.9 - 10.3 mg/dL Final   Total Protein 92/80/7974 7.3  6.5 - 8.1 g/dL Final   Albumin 92/80/7974 4.4  3.5 - 5.0 g/dL Final   AST 92/80/7974 76 (H)  15 - 41 U/L Final   ALT 09/06/2023 37  0 - 44 U/L Final   Alkaline Phosphatase 09/06/2023 49  38 - 126 U/L Final   Total Bilirubin 09/06/2023 0.9  0.0 - 1.2 mg/dL Final   GFR, Estimated 09/06/2023 >60  >60 mL/min Final   Comment: (NOTE) Calculated using the CKD-EPI Creatinine Equation (2021)    Anion gap 09/06/2023 15  5 - 15 Final   Performed at [facility], [address] [place], [place] [address]   Alcohol, Ethyl (B) 09/06/2023 198 (H)  <15 mg/dL Final   Comment: (NOTE) For medical purposes only. Performed at [facility], [address] [place], [place] [address]    POC Amphetamine UR 09/06/2023 None Detected  NONE DETECTED (Cut Off Level 1000 ng/mL) Final   POC Secobarbital (BAR) 09/06/2023 None Detected  NONE DETECTED (Cut Off Level 300 ng/mL) Final   POC Buprenorphine (BUP) 09/06/2023 None Detected  NONE DETECTED (Cut Off Level 10 ng/mL) Final   POC Oxazepam (BZO) 09/06/2023 None Detected  NONE DETECTED (Cut Off Level 300 ng/mL) Final   POC Cocaine UR 09/06/2023 None Detected  NONE DETECTED (Cut Off Level 300 ng/mL) Final   POC Methamphetamine UR 09/06/2023 None Detected  NONE DETECTED (Cut Off Level 1000 ng/mL) Final   POC Morphine 09/06/2023 None Detected  NONE DETECTED (Cut Off Level 300 ng/mL) Final   POC Methadone UR 09/06/2023 None Detected  NONE DETECTED (Cut Off Level 300 ng/mL) Final   POC Oxycodone  UR 09/06/2023 None Detected  NONE DETECTED (Cut Off Level 100 ng/mL) Final   POC Marijuana UR 09/06/2023 None Detected  NONE DETECTED (Cut Off Level 50 ng/mL) Final   Magnesium  09/06/2023 1.9  1.7 - 2.4 mg/dL Final   Performed at [facility] Lab, [address] [place], [place] [address]    Blood Alcohol level:  Lab Results  Component  Value Date   [facility] <15 01/04/2024   ETH 55 (H) 01/04/2024    Metabolic Disorder Labs: Lab Results  Component Value Date   HGBA1C 5.5 01/04/2024   MPG 111.15 01/04/2024  No results found for: PROLACTIN Lab Results  Component Value Date   CHOL 223 (H) 01/04/2024   TRIG 38 01/04/2024   HDL 124 01/04/2024   CHOLHDL 1.8 01/04/2024   VLDL 8 01/04/2024   LDLCALC 91 01/04/2024    Therapeutic Lab Levels: No results found for: LITHIUM No results found for: VALPROATE No results found for: CBMZ  Physical Findings   PHQ2-9    Flowsheet Row ED from 01/04/2024 in [facility] from 09/06/2023 in [facility] ED from 11/15/2022 in [facility]  PHQ-2 Total Score 1 0 4  PHQ-9 Total Score -- 9 10   Flowsheet Row ED from 01/04/2024 in [facility] Most recent reading at 01/04/2024  8:27 PM ED from 01/04/2024 in [facility] Emergency Department at [facility] Most recent reading at 01/04/2024  1:23 PM ED from 01/04/2024 in [facility] Most recent reading at 01/04/2024 11:24 AM  C-SSRS RISK CATEGORY No Risk No Risk No Risk     Musculoskeletal  Strength & Muscle Tone: within normal limits Gait & Station: normal Patient leans: N/A  Psychiatric Specialty Exam  Presentation  General Appearance:  Appropriate for Environment  Eye Contact: Fair  Speech: Clear and Coherent  Speech Volume: Normal  Handedness: Right   Mood and Affect  Mood: Euthymic  Affect: Congruent   Thought Process  Thought Processes: Coherent  Descriptions of Associations:Intact  Orientation:Full (Time, Place and Person)  Thought Content:Logical  Diagnosis of Schizophrenia or Schizoaffective disorder in past: No    Hallucinations:Hallucinations: None  Ideas of Reference:None  Suicidal Thoughts:Suicidal Thoughts: No  Homicidal  Thoughts:Homicidal Thoughts: No   Sensorium  Memory: Immediate Fair  Judgment: Fair  Insight: Fair   Chartered Certified Accountant: Fair  Attention Span: Fair  Recall: [employer] of Knowledge: Fair  Language: Fair   Psychomotor Activity  Psychomotor Activity: Psychomotor Activity: Normal   Assets  Assets: Communication Skills; Desire for Improvement; Resilience   Sleep  Sleep: Sleep: Good  Estimated Sleeping Duration (Last 24 Hours): 12.25-14.00 hours (Due to Daylight Saving Time, the durations displayed may not accurately represent documentation during the time change interval)  No data recorded  Physical Exam  Physical Exam ROS Blood pressure 129/80, pulse 64, temperature 97.8 F (36.6 C), temperature source Oral, resp. rate 16, SpO2 97%. There is no height or weight on file to calculate BMI.  Treatment Plan Summary: 33 year old male with a family history of anxiety and 12 year history of heavy alcohol consumption consistent with alcohol use disorder (heavy/binge) with mood/anxiety/insomnia components. Lorazepam  detox effective, well-tolerated with last dose planned for 11/21. Anticipate transitional until [facility] residential treatment available on 12/4.Melatonin 5mg  has improved sleep. Naltrexone  and mirtazapine well-tolerated. EKG improved with 2x SL NTG on 12/19.    KANDI JAYSON HAHN, MD 01/08/2024 5:34 PM

## 2024-01-08 NOTE — ED Notes (Addendum)
 Pt denied concerns or complaints. Pt currently watching TV in dayroom. No signs of distress observed.

## 2024-01-08 NOTE — Group Note (Signed)
 Group Topic: Change and Accountability  Group Date: 01/08/2024 Start Time: 2030 End Time: 2100 Facilitators: Anice Benton LABOR, NT  Department: [facility]  Number of Participants: 5  Group Focus: clarity of thought, communication, feeling awareness/expression, and goals/reality orientation Treatment Modality:  Individual Therapy Interventions utilized were assignment and reminiscence Purpose: explore maladaptive thinking and increase insight  Name: Chad Tanner Date of Birth: Apr 04, 1990  MR: [id number]    Level of Participation: active Quality of Participation: cooperative Interactions with others: gave feedback Mood/Affect: appropriate and positive Triggers (if applicable): N/A Cognition: goal directed Progress: Moderate Response: good Plan: follow-up needed  Patients Problems:  Patient Active Problem List   Diagnosis Date Noted   Abnormal EKG 01/05/2024   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

## 2024-01-08 NOTE — Group Note (Signed)
 Group Topic: Understanding Self  Group Date: 01/08/2024 Start Time: 1200 End Time: 1230 Facilitators: Daved Tinnie HERO, RN  Department: [facility]  Number of Participants: 8  Group Focus: nursing group Treatment Modality:  Psychoeducation Interventions utilized were exploration Purpose: increase insight  Name: Chad Tanner Date of Birth: [birth year]  MR: [id number]    Level of Participation: when cued Quality of Participation: attentive and cooperative Interactions with others: gave feedback Mood/Affect: appropriate Triggers (if applicable): n/a Cognition: insightful Progress: Gaining insight Response: pt says they will respectfully decline drinking alcohol and take needed space from others to help maintain and strengthen his values Plan: patient will be encouraged to attend future RN groups    Patients Problems:  Patient Active Problem List   Diagnosis Date Noted   Abnormal EKG 01/05/2024   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

## 2024-01-08 NOTE — ED Notes (Signed)
 RN could not see any vitals done on patient for the shift, not sure if pt had refused it.

## 2024-01-08 NOTE — Care Management (Signed)
 [facility] Care Management  Peer Support Specialist is meeting with the patient for possible intake into the transitional living program.  Patient has an expected date of admission to [facility] on 01/22/24. If she is accepted into the transitional program then she can stay there until her admission date of 01/22/24 to [facility].

## 2024-01-08 NOTE — Group Note (Signed)
 Group Topic: Decisional Balance/Substance Abuse  Group Date: 01/08/2024 Start Time: 1515 End Time: 1600 Facilitators: Nicholaus Arlyne BIRCH, NT; Elnor Keven SAILOR  Department: [facility]  Number of Participants: 8  Group Focus: chemical dependency education Treatment Modality:  Psychoeducation Interventions utilized were support Purpose: relapse prevention strategies  Name: SAMRAT HAYWARD Date of Birth: 08/17/1990  MR: [id number]    Level of Participation: minimal Quality of Participation: attentive Interactions with others: listened Mood/Affect: appropriate Triggers (if applicable): NA Cognition: coherent/clear Progress: Minimal Response: Read through NA Am I an Addict questions and discussed experience of being an addict. Listened to testimony of recovery and supported and encouraged by staff to be successful and confident in recovery. Plan: follow-up needed  Patients Problems:  Patient Active Problem List   Diagnosis Date Noted   Abnormal EKG 01/05/2024   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

## 2024-01-09 DIAGNOSIS — F1024 Alcohol dependence with alcohol-induced mood disorder: Secondary | ICD-10-CM | POA: Diagnosis not present

## 2024-01-09 DIAGNOSIS — F10239 Alcohol dependence with withdrawal, unspecified: Secondary | ICD-10-CM | POA: Diagnosis not present

## 2024-01-09 DIAGNOSIS — F10282 Alcohol dependence with alcohol-induced sleep disorder: Secondary | ICD-10-CM | POA: Diagnosis not present

## 2024-01-09 DIAGNOSIS — R9431 Abnormal electrocardiogram [ECG] [EKG]: Secondary | ICD-10-CM | POA: Diagnosis not present

## 2024-01-09 MED ORDER — MELATONIN 5 MG PO TABS: 5.0000 mg | ORAL_TABLET | Freq: Every day | ORAL | 1 refills | Status: DC

## 2024-01-09 MED ORDER — NALTREXONE HCL 50 MG PO TABS: 50.0000 mg | ORAL_TABLET | ORAL | 1 refills | Status: DC

## 2024-01-09 MED ORDER — MIRTAZAPINE 15 MG PO TABS: 15.0000 mg | ORAL_TABLET | Freq: Every day | ORAL | 1 refills | Status: DC

## 2024-01-09 NOTE — ED Notes (Signed)
 Pt A&Ox4, calm & cooperative and in NAD at this time. Denies SI/HI/AVH. Contracts for safety. Encouragement and support given. Will continue to monitor.

## 2024-01-09 NOTE — ED Provider Notes (Signed)
 [facility]BC/OBS ASAP Discharge Summary  Date and Time: 01/09/2024 12:25 PM  Name: Chad Tanner  MRN:  [id number]   Discharge Diagnoses:  Final diagnoses:  Alcohol dependence with withdrawal with complication (HCC)  Abnormal EKG  Alcohol-induced insomnia (HCC)  Alcohol-induced mood disorder (HCC)   Subjective: Patient has arranged to stay with mother until [facility] treatment available in early December. Sleep was good. No issues overnight. No medication concerns. Understands general heart healthy behaviors such as alcohol abstinence, reducing cigarette smoking, limiting other forms of nicotine , improved diet, and daily physical activity. He is also aware he needs to see primary care physician to monitor h/o QT prolongation.  Stay Summary: Patient engaged in multimodal treatment for polysubstance use disorder. Patient quickly adjusted to milieu. The patient was evaluated each day by a clinical provider to ascertain response to treatment. Lorazepam  taper through 11/21 was effective and well-tolerated. Naltrexone , melatonin,  and mirtazapine  initiated. Improvement was noted by the patient's report of decreasing symptoms, improved sleep and appetite, affect, medication tolerance, behavior, and participation in unit programming.   Patient's care was discussed during the interdisciplinary team meeting every day during the hospitalization.  Patient was asked each day to complete a self inventory noting mood, mental status, pain, new symptoms, anxiety and concerns. Labs were reviewed with the patient, and abnormal results were discussed with the patient. QT prolongation of unclear significance improved during admission. Aberrant EKG improved with NTG.   Symptoms were reported as significantly decreased or resolved completely by discharge.     The patient denied having side effects to prescribed psychiatric medication.  Total Time spent with patient: I personally spent 20 minutes on the unit in direct  patient care. The direct patient care time included face-to-face time with the patient, reviewing the patient's chart, communicating with other professionals, and coordinating care. Greater than 50% of this time was spent in counseling or coordinating care with the patient regarding goals of hospitalization, psycho-education, and discharge planning needs.   On my assessment the patient denied SI, HI, AVH, paranoia, ideas of reference, or first rank symptoms on day of discharge. Patient denied drug cravings or active signs of withdrawal. Patient denied medication side-effects. Patient was not deemed to be a danger to self or others on day of discharge and was in agreement with discharge plans.   History: He denies a past psychiatric history other than alcohol use. He denies past inpatient psychiatric hospitalizations for mental health other than alcohol detoxification treatment. He denies outpatient psychiatry or counseling. He denies a medical history. He denies taking prescribed medications. He denies physical complaints on exam. He reports a family substance abuse history of mother, maternal cousins and maternal aunts and uncle history of alcoholism.   He currently resides with mother. He has an 14 year old child who does not live with him. 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 He denies legal issues.   Current Medications:  Current Facility-Administered Medications  Medication Dose Route Frequency Provider Last Rate Last Admin   acetaminophen  (TYLENOL ) tablet 650 mg  650 mg Oral Q6H PRN White, Patrice L, NP       alum & mag hydroxide-simeth (MAALOX/MYLANTA) 200-200-20 MG/5ML suspension 30 mL  30 mL Oral Q4H PRN White, Patrice L, NP       LORazepam  (ATIVAN ) injection 1 mg  1 mg Intramuscular Continuous PRN Gottfried, Rhoda J, MD       LORazepam  (  ATIVAN ) tablet 1 mg  1 mg Oral Continuous PRN Gottfried,  Rhoda J, MD       magnesium  hydroxide (MILK OF MAGNESIA) suspension 30 mL  30 mL Oral Daily PRN White, Patrice L, NP       magnesium  oxide (MAG-OX) tablet 200 mg  200 mg Oral BID Lawrnce, Rhoda J, MD   200 mg at 01/09/24 0913   melatonin tablet 5 mg  5 mg Oral QHS Cole Salles C, MD   5 mg at 01/08/24 2105   mirtazapine  (REMERON ) tablet 15 mg  15 mg Oral QHS Yoshiaki Kreuser C, MD   15 mg at 01/08/24 2105   multivitamin with minerals tablet 1 tablet  1 tablet Oral Daily White, Patrice L, NP   1 tablet at 01/09/24 0913   naltrexone  (DEPADE) tablet 50 mg  50 mg Oral BH-q7a Anetha Slagel C, MD   50 mg at 01/09/24 0913   nitroGLYCERIN  (NITROSTAT ) SL tablet 0.4 mg  0.4 mg Sublingual Q5 min PRN Cole Salles BROCKS, MD   0.4 mg at 01/07/24 1910   thiamine  (VITAMIN B1) tablet 100 mg  100 mg Oral Daily White, Patrice L, NP   100 mg at 01/09/24 9086   Current Outpatient Medications  Medication Sig Dispense Refill   melatonin 5 MG TABS Take 1 tablet (5 mg total) by mouth at bedtime. 30 tablet 1   mirtazapine  (REMERON ) 15 MG tablet Take 1 tablet (15 mg total) by mouth at bedtime. 30 tablet 1   [START ON 01/10/2024] naltrexone  (DEPADE) 50 MG tablet Take 1 tablet (50 mg total) by mouth every morning. 30 tablet 1    PTA Medications:  Facility Ordered Medications  Medication   acetaminophen  (TYLENOL ) tablet 650 mg   alum & mag hydroxide-simeth (MAALOX/MYLANTA) 200-200-20 MG/5ML suspension 30 mL   magnesium  hydroxide (MILK OF MAGNESIA) suspension 30 mL   thiamine  (VITAMIN B1) tablet 100 mg   multivitamin with minerals tablet 1 tablet   [EXPIRED] LORazepam  (ATIVAN ) tablet 1 mg   [EXPIRED] loperamide  (IMODIUM ) capsule 2-4 mg   magnesium  oxide (MAG-OX) tablet 200 mg   [COMPLETED] LORazepam  (ATIVAN ) tablet 2 mg   Followed by   [COMPLETED] LORazepam  (ATIVAN ) tablet 1 mg   Followed by   [COMPLETED] LORazepam  (ATIVAN ) tablet 1 mg   Followed by   [COMPLETED] LORazepam  (ATIVAN ) tablet 1 mg    LORazepam  (ATIVAN ) tablet 1 mg   LORazepam  (ATIVAN ) injection 1 mg   melatonin tablet 5 mg   mirtazapine  (REMERON ) tablet 15 mg   naltrexone  (DEPADE) tablet 50 mg   [COMPLETED] cloNIDine  (CATAPRES ) tablet 0.1 mg   nitroGLYCERIN  (NITROSTAT ) SL tablet 0.4 mg   [COMPLETED] cloNIDine  (CATAPRES ) tablet 0.1 mg   PTA Medications  Medication Sig   [START ON 01/10/2024] naltrexone  (DEPADE) 50 MG tablet Take 1 tablet (50 mg total) by mouth every morning.   melatonin 5 MG TABS Take 1 tablet (5 mg total) by mouth at bedtime.   mirtazapine  (REMERON ) 15 MG tablet Take 1 tablet (15 mg total) by mouth at bedtime.       01/09/2024   12:25 PM 01/07/2024    4:40 PM 01/06/2024    4:10 PM  Depression screen PHQ 2/9  Decreased Interest 1 1 1   Down, Depressed, Hopeless 0 0 0  PHQ - 2 Score 1 1 1     Flowsheet Row ED from 01/04/2024 in [facility] Most recent reading at 01/04/2024  8:27 PM ED from 01/04/2024 in [facility] Most recent reading at 01/04/2024  1:23 PM ED from 01/04/2024 in [facility] Most recent reading at 01/04/2024 11:24 AM  C-SSRS RISK CATEGORY No Risk No Risk No Risk    Musculoskeletal  Strength & Muscle Tone: within normal limits Gait & Station: normal Patient leans: N/A  Psychiatric Specialty Exam  Presentation  General Appearance:  Appropriate for Environment  Eye Contact: Fair  Speech: Clear and Coherent  Speech Volume: Normal  Handedness: Right   Mood and Affect  Mood: Euthymic  Affect: Congruent   Thought Process  Thought Processes: Coherent  Descriptions of Associations:Intact  Orientation:Full (Time, Place and Person)  Thought Content:Logical  Diagnosis of Schizophrenia or Schizoaffective disorder in past: No    Hallucinations:Hallucinations: None  Ideas of Reference:None  Suicidal Thoughts:Suicidal Thoughts: No  Homicidal  Thoughts:Homicidal Thoughts: No   Sensorium  Memory: Immediate Fair  Judgment: Fair  Insight: Fair   Art Therapist  Concentration: Fair  Attention Span: Fair  Recall: [employer] of Knowledge: Fair  Language: Fair   Psychomotor Activity  Psychomotor Activity: Psychomotor Activity: Normal   Assets  Assets: Communication Skills; Desire for Improvement; Resilience   Sleep  Sleep: Sleep: Good  Estimated Sleeping Duration (Last 24 Hours): 11.00-12.25 hours (Due to Daylight Saving Time, the durations displayed may not accurately represent documentation during the time change interval)  No data recorded  Physical Exam  Physical Exam ROS Blood pressure (!) 119/98, pulse 68, temperature 98 F (36.7 C), resp. rate 17, SpO2 100%. There is no height or weight on file to calculate BMI.  Demographic Factors:  Male, Low socioeconomic status, and Unemployed  Loss Factors: Decrease in vocational status and Financial problems/change in socioeconomic status  Historical Factors: Impulsivity  Risk Reduction Factors:   Living with another person, especially a relative  Continued Clinical Symptoms:  Alcohol/Substance Abuse/Dependencies  Cognitive Features That Contribute To Risk:  None    Suicide Risk:  Minimal: No identifiable suicidal ideation.  Patients presenting with no risk factors but with morbid ruminations; may be classified as minimal risk based on the severity of the depressive symptoms  Plan Of Care/Follow-up recommendations:  Activity: Daily physical activity; reduce sedentary behaviors   Diet: Portion-limited diet rich in produce, whole (minimally processed) grains, nuts/seeds, eggs, beans/legumes, seafood, lowfat dairy (if tolerated), fermented food, lean meat. Copious water intake. Limit (ultra)processed foods and sugar-sweetened beverages.    Other: -Follow-up with your outpatient psychiatric provider -instructions on appointment date,  time, and address (location) are provided to you in discharge paperwork. Transition to [facility] intake 01/22/24 at 0900.  [facility] - [facility] [address] [place], [place] [address] Phone: [phone]  -Take your psychiatric medications as prescribed at discharge - instructions are provided to you in the discharge paperwork (mirtazapine , naltrexone , melatonin)   -Follow-up with outpatient primary care doctor to optimize health maintenance/preventative care and other specialists -for management of chronic medical disease. Please followup with cardiology for evaluation of QT prolongation and aberrant EKG.   -Recommend total abstinence from alcohol, tobacco, and other illicit drug use at discharge.    -If your psychiatric symptoms recur, worsen, or if you have side effects to your psychiatric medications, call your outpatient psychiatric provider, 911, [phone] or go to the nearest emergency department.   -If suicidal thoughts occur, immediately call your outpatient psychiatric provider, 911, [phone] or go to the nearest emergency department.  Disposition: Discharge home.  KANDI JAYSON HAHN, MD 01/09/2024, 12:25 PM

## 2024-01-09 NOTE — ED Notes (Signed)
 Discharge instructions reviewed w/ pt. Medications, follow-up care and resources reviewed. Pt verbalized understanding. All belongings returned to pt. Pt A&Ox4, ambulatory w/ steady gait and VSS upon departure. Denies SI/HI/AVH

## 2024-01-09 NOTE — ED Notes (Signed)
 Pt is sleeping, no acute distress noted. Q15 safety checks in place.

## 2024-01-09 NOTE — ED Notes (Signed)
 Pt sleeping at this time. Rise and fall of chest noted. Pt in NAD at this time. Will continue to monitor.

## 2024-01-09 NOTE — Care Management (Signed)
 [facility] Care Management...  Writer met with patient to discuss discharge planning.SABRA  Writer advised patient of meeting with [facility] Harlene kayleen [place] discussed with patient having a plan B to secure housing until his scheduled appointment with [facility] 01/22/24  Patient reported possibly staying with uncle or cousin. Patient state he will reach out to family members

## 2024-01-26 ENCOUNTER — Telehealth: Payer: MEDICAID | Admitting: Nurse Practitioner

## 2024-01-26 ENCOUNTER — Ambulatory Visit (HOSPITAL_COMMUNITY)
Admission: EM | Admit: 2024-01-26 | Discharge: 2024-01-26 | Disposition: A | Discharge status: Home / Self Care | Attending: Nurse Practitioner | Admitting: Nurse Practitioner

## 2024-01-26 VITALS — BP 140/100

## 2024-01-26 DIAGNOSIS — R030 Elevated blood-pressure reading, without diagnosis of hypertension: Secondary | ICD-10-CM | POA: Insufficient documentation

## 2024-01-26 DIAGNOSIS — Z765 Malingerer [conscious simulation]: Secondary | ICD-10-CM

## 2024-01-26 DIAGNOSIS — F1010 Alcohol abuse, uncomplicated: Secondary | ICD-10-CM | POA: Insufficient documentation

## 2024-01-26 DIAGNOSIS — Z5900 Homelessness unspecified: Secondary | ICD-10-CM | POA: Insufficient documentation

## 2024-01-26 MED ORDER — AMLODIPINE BESYLATE 5 MG PO TABS: 5.0000 mg | ORAL_TABLET | Freq: Every day | ORAL | 0 refills | Status: DC

## 2024-01-26 NOTE — ED Provider Notes (Signed)
 Behavioral Health Urgent Care Medical Screening Exam  Patient Name: Chad Tanner MRN: [id number] Date of Evaluation: 01/26/24 Chief Complaint: My family dropped me off here because I started drinking  Diagnosis:  Final diagnoses:  Malingering  Alcohol abuse  Homelessness unspecified    History of Present illness: Chad Tanner is a 33 y.o. male. Psychiatric history of alcohol use disorder, and alcohol induced mood disorder, who presented voluntarily as a walk in to [facility] seeking detox and rehab treatment for alcohol use.   Patient was seen face to face by this provider and chart reviewed. Per chart reviewed, patient recently was admitted at the [facility] for alcohol detox between 11/16-12/1 and discharged with F/U at [facility] recovery services for 12/4.  Patient reports he did not follow up at [facility]  because I started drinking again, so my family dropped me off here.  Patient continues,  I went to see a doctor today and she told me my blood pressure was high and sent my prescription to a pharmacist, but they told me to come here to get treated because you guys diagnosed me with high blood pressure.   Discussed recommendation for patient to follow up with his primary care provider for all medical related needs. Current VS stable. Patient reports he will pick up his BP prescription in the am.   Discussed recommendation for discharge and follow up with substance abuse intensive outpatient provider at [facility]. Resources provided.  Patient is provided with opportunity for questions. He verbalized understanding and is in agreement.   On evaluation, patient is alert, oriented x 3, and cooperative. Speech is clear and coherent. Pt appears casually dressed. Eye contact is fair. Mood is euthymic, affect is congruent with mood. Thought process is coherent and goal directed and thought content is WDL. Pt denies SI/HI/AVH. There is no objective indication that the patient is responding to  internal stimuli. No delusions elicited during this assessment.     Flowsheet Row ED from 01/26/2024 in [facility] Most recent reading at 01/26/2024  7:00 PM ED from 01/04/2024 in [facility] Most recent reading at 01/04/2024  8:27 PM ED from 01/04/2024 in [facility] Emergency Department at [facility] Most recent reading at 01/04/2024  1:23 PM  C-SSRS RISK CATEGORY No Risk No Risk No Risk    Psychiatric Specialty Exam  Presentation  General Appearance:Casual  Eye Contact:Fair  Speech:Clear and Coherent  Speech Volume:Normal  Handedness:Right   Mood and Affect  Mood: Euthymic  Affect: Congruent   Thought Process  Thought Processes: Coherent; Goal Directed  Descriptions of Associations:Intact  Orientation:Full (Time, Place and Person)  Thought Content:WDL  Diagnosis of Schizophrenia or Schizoaffective disorder in past: No   Hallucinations:None  Ideas of Reference:None  Suicidal Thoughts:No  Homicidal Thoughts:No   Sensorium  Memory: Immediate Good  Judgment: Poor  Insight: Shallow   Executive Functions  Concentration: Good  Attention Span: Good  Recall: Fair  Fund of Knowledge: Fair  Language: Good   Psychomotor Activity  Psychomotor Activity: Normal   Assets  Assets: Communication Skills; Desire for Improvement   Sleep  Sleep: Good  Number of hours:  6   Physical Exam: Physical Exam Constitutional:      General: He is not in acute distress.    Appearance: He is not diaphoretic.  HENT:     Nose: No congestion.  Cardiovascular:     Rate and Rhythm: Normal rate.  Pulmonary:     Effort:  No respiratory distress.  Chest:     Chest wall: No tenderness.  Neurological:     Mental Status: He is alert and oriented to person, place, and time.  Psychiatric:        Attention and Perception: Attention and perception normal.        Mood and Affect: Mood  and affect normal.        Speech: Speech normal.        Behavior: Behavior is cooperative.        Thought Content: Thought content normal.        Cognition and Memory: Cognition normal.    Review of Systems  Constitutional:  Negative for chills, diaphoresis and fever.  HENT:  Negative for congestion.   Eyes:  Negative for discharge.  Respiratory:  Negative for cough, shortness of breath and wheezing.   Cardiovascular:  Negative for chest pain and palpitations.  Gastrointestinal:  Negative for diarrhea, nausea and vomiting.   Blood pressure 128/89, pulse 72, temperature 97.8 F (36.6 C), temperature source Oral, resp. rate 16, SpO2 100%. There is no height or weight on file to calculate BMI.  Musculoskeletal: Strength & Muscle Tone: within normal limits Gait & Station: normal Patient leans: N/A   [facility] MSE Discharge Disposition for Follow up and Recommendations: Based on my evaluation the patient does not appear to have an emergency medical condition and can be discharged with resources and follow up care in outpatient services for Substance Abuse Intensive Outpatient Program  Patient denies SI/HI/AVH or paranoia, Patient does not meet inpatient psychiatric admission criteria and IVC criteria at this time. There is no evidence of imminent risk of harm to self or others.   Recommend discharge home and follow up outpatient substance abuse program. Resources provided for [facility] recovery services.  Patient also provided with a taxi voucher to return home.   Discharge recommendations:  Patient is to take medications as prescribed. Please follow up with your primary care provider for all medical related needs.   Therapy: We recommend that patient participate in individual therapy to address mental health concerns.  Medications: The patient or guardian is to contact a medical professional and/or outpatient provider to address any new side effects that develop. The patient or guardian  should update outpatient providers of any new medications and/or medication changes.   Atypical antipsychotics: If you are prescribed an atypical antipsychotic, it is recommended that your height, weight, BMI, blood pressure, fasting lipid panel, and fasting blood sugar be monitored by your outpatient providers.  Safety:  The patient should abstain from use of illicit substances/drugs and abuse of any medications. If symptoms worsen or do not continue to improve or if the patient becomes actively suicidal or homicidal then it is recommended that the patient return to the closest hospital emergency department, the [facility], or call 911 for further evaluation and treatment. National Suicide Prevention Lifeline 1-800-SUICIDE or 705-027-1355.  About 988 988 offers 24/7 access to trained crisis counselors who can help people experiencing mental health-related distress. People can call or text 988 or chat 988lifeline.org for themselves or if they are worried about a loved one who may need crisis support.  Crisis Mobile: [facility] [facility]:                     956 202 2767 (for crisis response 24 hours a day) [facility] Hotline:  (331) 835-9488   Patient discharged in stable condition.  Thurman LULLA Ivans, NP 01/26/2024, 11:24 PM

## 2024-01-26 NOTE — Progress Notes (Signed)
 Acute Video Visit    Virtual Visit Consent:   STEFFON GLADU, you are scheduled for a virtual visit with a [facility] provider today.     Just as with appointments in the office, your consent must be obtained to participate.  Your consent will be active for this visit and any virtual visit you may have with one of our providers in the next 365 days.     If you have a MyChart account, a copy of this consent can be sent to you electronically.  All virtual visits are billed to your insurance company just like a traditional visit in the office.    If the connection with a video visit is poor, the visit may have to be switched to a telephone visit.  With either a video or telephone visit, we are not always able to ensure that we have a secure connection.     I need to obtain your verbal consent now.   Are you willing to proceed with your visit today?    JAKOB KIMBERLIN has provided verbal consent on 01/26/2024 for a virtual visit (video or telephone).   Lauraine Kitty, FNP  Date: 01/26/2024 11:00 AM  Subjective:     Patient ID: Chad Tanner, male    DOB: Dec 30, 1990, 33 y.o.   MRN: [id number]  LILLETTE Lauraine Kitty, connected with  ALLEX LAPOINT  ([id number], 1990-10-06) on 01/26/24 at 11:00 AM EST by a video-enabled telemedicine application and verified that I am speaking with the correct person using two identifiers.   Location: Patient: [facility]  Provider: Virtual Visit Location Provider: Home Office   I discussed the limitations of evaluation and management by telemedicine and the availability of in person appointments. The patient expressed understanding and agreed to proceed.     HPI LONNIE RETH is a 33 y.o. who identifies as a male who was assigned male at birth, and is being seen today for help with elevated blood pressure.  The patient is presenting to [facility] prior to departure for stay at [facility] for rehabilitation   Medical history is significant for alcohol disorder,  depression and recent [facility] stay (discharged 01/09/2024). At the time of discharge he was prescribed naltrexone  (50mg  every morning) melatonin (5 mg every evenining) and Remeron  at bedtime as well (15mg ). He does not have any of those medications at this time and does not remember picking them up after discharge.   His blood pressure at the time of his visit today is 140/100, on review of his las three BP his diastolic has always been elevated 90-100. Concern for rejection of admission criteria at rehab if BP remains elevated   He has labs performed within the past month in the ED and kidney function was adequate.       Objective:    BP (!) 140/100  BP Readings from Last 3 Encounters:  01/26/24 (!) 140/100  01/04/24 (!) 138/107  08/21/19 (!) 136/91     Recent Results (from the past 2160 hours)  CBC with Differential/Platelet     Status: Abnormal   Collection Time: 01/04/24 11:21 AM  Result Value Ref Range   WBC 8.8 4.0 - 10.5 K/uL   RBC 4.91 4.22 - 5.81 MIL/uL   Hemoglobin 15.1 13.0 - 17.0 g/dL   HCT 56.2 60.9 - 47.9 %   MCV 89.0 80.0 - 100.0 fL   MCH 30.8 26.0 - 34.0 pg   MCHC 34.6 30.0 - 36.0 g/dL   RDW  16.4 (H) 11.5 - 15.5 %   Platelets 235 150 - 400 K/uL   nRBC 0.0 0.0 - 0.2 %   Neutrophils Relative % 81 %   Neutro Abs 7.1 1.7 - 7.7 K/uL   Lymphocytes Relative 12 %   Lymphs Abs 1.1 0.7 - 4.0 K/uL   Monocytes Relative 7 %   Monocytes Absolute 0.6 0.1 - 1.0 K/uL   Eosinophils Relative 0 %   Eosinophils Absolute 0.0 0.0 - 0.5 K/uL   Basophils Relative 0 %   Basophils Absolute 0.0 0.0 - 0.1 K/uL   Immature Granulocytes 0 %   Abs Immature Granulocytes 0.02 0.00 - 0.07 K/uL    Comment: Performed at [facility], [address], [place], [place] [address]  Comprehensive metabolic panel     Status: Abnormal   Collection Time: 01/04/24 11:21 AM  Result Value Ref Range   Sodium 140 135 - 145 mmol/L   Potassium 4.3 3.5 - 5.1 mmol/L   Chloride 101 98 - 111 mmol/L   CO2 25  22 - 32 mmol/L   Glucose, Bld 64 (L) 70 - 99 mg/dL    Comment: Glucose reference range applies only to samples taken after fasting for at least 8 hours.   BUN 10 6 - 20 mg/dL   Creatinine, Ser 9.01 0.61 - 1.24 mg/dL   Calcium 9.0 8.9 - 89.6 mg/dL   Total Protein 7.4 6.5 - 8.1 g/dL   Albumin 4.3 3.5 - 5.0 g/dL   AST 60 (H) 15 - 41 U/L   ALT 17 0 - 44 U/L   Alkaline Phosphatase 56 38 - 126 U/L   Total Bilirubin 1.4 (H) 0.0 - 1.2 mg/dL   GFR, Estimated >39 >39 mL/min    Comment: (NOTE) Calculated using the CKD-EPI Creatinine Equation (2021)    Anion gap 14 5 - 15    Comment: Performed at [facility], [address] [place], [place] [address]  Hemoglobin A1c     Status: None   Collection Time: 01/04/24 11:21 AM  Result Value Ref Range   Hgb A1c MFr Bld 5.5 4.8 - 5.6 %    Comment: (NOTE) Diagnosis of Diabetes The following HbA1c ranges recommended by the American Diabetes Association (ADA) may be used as an aid in the diagnosis of diabetes mellitus.  Hemoglobin             Suggested A1C NGSP%              Diagnosis  <5.7                   Non Diabetic  5.7-6.4                Pre-Diabetic  >6.4                   Diabetic  <7.0                   Glycemic control for                       adults with diabetes.     Mean Plasma Glucose 111.15 mg/dL    Comment: Performed at [facility], [address], [place], [place] [address]  Ethanol     Status: Abnormal   Collection Time: 01/04/24 11:21 AM  Result Value Ref Range   Alcohol, Ethyl (B) 55 (H) <15 mg/dL    Comment: (  NOTE) For medical purposes only. Performed at [facility], [address], [place], [place] [address]   Lipid panel     Status: Abnormal   Collection Time: 01/04/24 11:21 AM  Result Value Ref Range   Cholesterol 223 (H) 0 - 200 mg/dL   Triglycerides 38 <849 mg/dL   HDL 875 >59 mg/dL   Total CHOL/HDL Ratio 1.8 RATIO   VLDL 8 0 - 40 mg/dL   LDL Cholesterol 91 0 - 99 mg/dL    Comment:         Total Cholesterol/HDL:CHD Risk Coronary Heart Disease Risk Table                     Men   Women  1/2 Average Risk   3.4   3.3  Average Risk       5.0   4.4  2 X Average Risk   9.6   7.1  3 X Average Risk  23.4   11.0        Use the calculated Patient Ratio above and the CHD Risk Table to determine the patient's CHD Risk.        ATP III CLASSIFICATION (LDL):  <100     mg/dL   Optimal  899-870  mg/dL   Near or Above                    Optimal  130-159  mg/dL   Borderline  839-810  mg/dL   High  >809     mg/dL   Very High Performed at [facility], [address], [place], [place] [address]   TSH     Status: None   Collection Time: 01/04/24 11:21 AM  Result Value Ref Range   TSH 0.392 0.350 - 4.500 uIU/mL    Comment: Performed by a 3rd Generation assay with a functional sensitivity of <=0.01 uIU/mL. Performed at [facility], [address], [place], [place] [address]   POCT Urine Drug Screen - (I-Screen)     Status: Normal   Collection Time: 01/04/24 11:25 AM  Result Value Ref Range   POC Amphetamine UR None Detected NONE DETECTED (Cut Off Level 1000 ng/mL)   POC Secobarbital (BAR) None Detected NONE DETECTED (Cut Off Level 300 ng/mL)   POC Buprenorphine (BUP) None Detected NONE DETECTED (Cut Off Level 10 ng/mL)   POC Oxazepam (BZO) None Detected NONE DETECTED (Cut Off Level 300 ng/mL)   POC Cocaine UR None Detected NONE DETECTED (Cut Off Level 300 ng/mL)   POC Methamphetamine UR None Detected NONE DETECTED (Cut Off Level 1000 ng/mL)   POC Morphine None Detected NONE DETECTED (Cut Off Level 300 ng/mL)   POC Methadone UR None Detected NONE DETECTED (Cut Off Level 300 ng/mL)   POC Oxycodone  UR None Detected NONE DETECTED (Cut Off Level 100 ng/mL)   POC Marijuana UR None Detected NONE DETECTED (Cut Off Level 50 ng/mL)  CBC with Differential     Status: Abnormal   Collection Time: 01/04/24  2:56 PM  Result Value Ref Range   WBC 8.5 4.0 - 10.5 K/uL   RBC  4.91 4.22 - 5.81 MIL/uL   Hemoglobin 15.1 13.0 - 17.0 g/dL   HCT 56.5 60.9 - 47.9 %   MCV 88.4 80.0 - 100.0 fL   MCH 30.8 26.0 - 34.0 pg   MCHC 34.8 30.0 - 36.0 g/dL   RDW 83.9 (H) 88.4 - 84.4 %  Platelets 235 150 - 400 K/uL   nRBC 0.0 0.0 - 0.2 %   Neutrophils Relative % 76 %   Neutro Abs 6.6 1.7 - 7.7 K/uL   Lymphocytes Relative 17 %   Lymphs Abs 1.4 0.7 - 4.0 K/uL   Monocytes Relative 6 %   Monocytes Absolute 0.5 0.1 - 1.0 K/uL   Eosinophils Relative 0 %   Eosinophils Absolute 0.0 0.0 - 0.5 K/uL   Basophils Relative 1 %   Basophils Absolute 0.0 0.0 - 0.1 K/uL   Immature Granulocytes 0 %   Abs Immature Granulocytes 0.02 0.00 - 0.07 K/uL    Comment: Performed at [facility], [address] [place], [place] [address]  Comprehensive metabolic panel     Status: Abnormal   Collection Time: 01/04/24  2:56 PM  Result Value Ref Range   Sodium 138 135 - 145 mmol/L   Potassium 4.4 3.5 - 5.1 mmol/L   Chloride 99 98 - 111 mmol/L   CO2 27 22 - 32 mmol/L   Glucose, Bld 88 70 - 99 mg/dL    Comment: Glucose reference range applies only to samples taken after fasting for at least 8 hours.   BUN 10 6 - 20 mg/dL   Creatinine, Ser 8.82 0.61 - 1.24 mg/dL   Calcium 9.3 8.9 - 89.6 mg/dL   Total Protein 7.5 6.5 - 8.1 g/dL   Albumin 4.2 3.5 - 5.0 g/dL   AST 60 (H) 15 - 41 U/L   ALT 17 0 - 44 U/L   Alkaline Phosphatase 56 38 - 126 U/L   Total Bilirubin 1.2 0.0 - 1.2 mg/dL   GFR, Estimated >39 >39 mL/min    Comment: (NOTE) Calculated using the CKD-EPI Creatinine Equation (2021)    Anion gap 12 5 - 15    Comment: Performed at [facility], [address] [place], [place] [address]  Magnesium      Status: None   Collection Time: 01/04/24  2:56 PM  Result Value Ref Range   Magnesium  1.9 1.7 - 2.4 mg/dL    Comment: Performed at [facility], [address] [place], [place] [address]  Ethanol     Status: None   Collection Time: 01/04/24  2:58 PM  Result Value Ref Range    Alcohol, Ethyl (B) <15 <15 mg/dL    Comment: (NOTE) For medical purposes only. Performed at [facility], [address], [place], [place] [address]      Physical Exam        Assessment & Plan:   1. Elevated blood pressure reading (Primary) - amLODipine  (NORVASC ) 5 MG tablet; Take 1 tablet (5 mg total) by mouth daily.  Dispense: 90 tablet; Refill: 0   RTC after discharge from rehabilitation, may message provider or attempt VV while admitted if able  Otherwise will plan to continue care at [facility] at time of discharge    Follow Up Instructions: I discussed the assessment and treatment plan with the patient. The patient was provided an opportunity to ask questions and all were answered. The patient agreed with the plan and demonstrated an understanding of the instructions.  A copy of instructions were sent to the patient via MyChart unless otherwise noted below.    The patient was advised to call back or seek an in-person evaluation if the symptoms worsen or if the condition fails to improve as anticipated.    Lauraine Kitty, FNP  **Disclaimer: This note may have  been dictated with voice recognition software. Similar sounding words can inadvertently be transcribed and this note may contain transcription errors which may not have been corrected upon publication of note.**

## 2024-01-26 NOTE — Progress Notes (Signed)
   01/26/24 1852  [facility] Triage Screening (Walk-ins at [facility] only)  How Did You Hear About Us ? Self  What Is the Reason for Your Visit/Call Today? Pt seeking detox and rehab treatment for ETOH use. Reports 3 tall boys and half gallon of liqour everyother day. Denies SI, HI, AVH. Went to [facility] in June stayed clean a month after discharge. Not working, living with his children mother.  How Long Has This Been Causing You Problems? > than 6 months  Have You Recently Had Any Thoughts About Hurting Yourself? No  Are You Planning to Commit Suicide/Harm Yourself At This time? No  Have you Recently Had Thoughts About Hurting Someone Sherral? No  Are You Planning To Harm Someone At This Time? No  Explanation: NA  Physical Abuse Denies  Verbal Abuse Denies  Sexual Abuse Denies  Exploitation of patient/patient's resources Denies  Self-Neglect Denies  Are you currently experiencing any auditory, visual or other hallucinations? No  Have You Used Any Alcohol or Drugs in the Past 24 Hours? Yes  What Did You Use and How Much? ETOH DRINKS 3 TALL BOYS AND HALF GALLON OF LIQOUR EVERYOTHER DAY  Do you have any current medical co-morbidities that require immediate attention? No  Clinician description of patient physical appearance/behavior: CALM, ENGAGED, APPEARS MOTIVATED FOR TREATMENT  What Do You Feel Would Help You the Most Today? Alcohol or Drug Use Treatment  If access to [facility] was not available, would you have sought care in the Emergency Department? No  Determination of Need Urgent (48 hours)  Options For Referral Facility-Based Crisis  Determination of Need filed? Yes

## 2024-01-26 NOTE — Discharge Instructions (Addendum)
  Discharge recommendations:  Patient is to take medications as prescribed. Please follow up with your primary care provider for all medical related needs.   Therapy: We recommend that patient participate in individual therapy to address mental health concerns.  Medications: The patient or guardian is to contact a medical professional and/or outpatient provider to address any new side effects that develop. The patient or guardian should update outpatient providers of any new medications and/or medication changes.   Atypical antipsychotics: If you are prescribed an atypical antipsychotic, it is recommended that your height, weight, BMI, blood pressure, fasting lipid panel, and fasting blood sugar be monitored by your outpatient providers.  Safety:  The patient should abstain from use of illicit substances/drugs and abuse of any medications. If symptoms worsen or do not continue to improve or if the patient becomes actively suicidal or homicidal then it is recommended that the patient return to the closest hospital emergency department, the [facility], or call 911 for further evaluation and treatment. National Suicide Prevention Lifeline 1-800-SUICIDE or (226)426-6522.  About 988 988 offers 24/7 access to trained crisis counselors who can help people experiencing mental health-related distress. People can call or text 988 or chat 988lifeline.org for themselves or if they are worried about a loved one who may need crisis support.  Crisis Mobile: [facility]:                     (615)212-2104 (for crisis response 24 hours a day) [facility] Hotline:                                            620-565-7939

## 2024-01-26 NOTE — Progress Notes (Addendum)
 The patient presented for a primary care visit on 01/26/2024 to establish care. Blood pressure screening was conducted, and the result was 140/100. During the appointment, the patient reported no SDOH needs.  A review of the patient's chart revealed that [name], NP - [facility] is their PCP and no future appointments were indicated. Pt does not have any insurance. As a precaution CHW sent an email to pt with health insurance resources.   An additional follow up will be done according to the health equity team's protocol.

## 2024-02-02 ENCOUNTER — Other Ambulatory Visit: Payer: Self-pay | Admitting: *Deleted

## 2024-02-02 ENCOUNTER — Other Ambulatory Visit: Payer: Self-pay

## 2024-02-02 DIAGNOSIS — R030 Elevated blood-pressure reading, without diagnosis of hypertension: Secondary | ICD-10-CM

## 2024-02-02 DIAGNOSIS — I10 Essential (primary) hypertension: Secondary | ICD-10-CM

## 2024-02-02 MED ORDER — AMLODIPINE BESYLATE 5 MG PO TABS: 5.0000 mg | ORAL_TABLET | Freq: Every day | ORAL | 0 refills | Status: DC

## 2024-02-02 MED ORDER — AMLODIPINE BESYLATE 5 MG PO TABS
5.0000 mg | ORAL_TABLET | Freq: Every day | ORAL | 0 refills | Status: AC
  Filled 2024-02-02: qty 90, 90d supply, fill #0

## 2024-02-02 NOTE — Progress Notes (Signed)
 Came by for Bp check. 160/100.  Did not get meds as he couldn't fford them. Sent to [facility]

## 2024-02-10 NOTE — Progress Notes (Signed)
 The patient presented for a primary care visit on 01/26/2024 to establish care at the virtual primary clinic. Blood pressure screening was conducted, and the result was 140/100. During the appointment, the patient reported no SDOH needs.  A review of the patient's chart revealed that [name], NP - [facility] is their PCP and no future appointments were indicated. Pt did not have any insurance at the virtual primary care visit. As a precaution CHW at the vpc sent an email to pt with health insurance resources.   Post 60-day f/u on 02/10/2024 CHW confirmed in chart review indicates that pt has [employer] since the virtual primary clinic visit on 01/26/2024. No additional Health equity team support indicated at this time.
